# Patient Record
Sex: Male | Born: 1940 | Race: Black or African American | Hispanic: No | Marital: Married | Smoking: Former smoker
Health system: Southern US, Community
[De-identification: ages and names within clinical notes are randomized; demographics above are authoritative.]

## PROBLEM LIST (undated history)

## (undated) DIAGNOSIS — I639 Cerebral infarction, unspecified: Secondary | ICD-10-CM

## (undated) DIAGNOSIS — Z7189 Other specified counseling: Secondary | ICD-10-CM

## (undated) DIAGNOSIS — C801 Malignant (primary) neoplasm, unspecified: Secondary | ICD-10-CM

## (undated) DIAGNOSIS — D539 Nutritional anemia, unspecified: Secondary | ICD-10-CM

## (undated) DIAGNOSIS — I10 Essential (primary) hypertension: Secondary | ICD-10-CM

## (undated) DIAGNOSIS — Z5112 Encounter for antineoplastic immunotherapy: Secondary | ICD-10-CM

## (undated) DIAGNOSIS — Z789 Other specified health status: Secondary | ICD-10-CM

## (undated) HISTORY — DX: Other specified counseling: Z71.89

## (undated) HISTORY — DX: Other specified health status: Z78.9

## (undated) HISTORY — DX: Nutritional anemia, unspecified: D53.9

## (undated) HISTORY — DX: Encounter for antineoplastic immunotherapy: Z51.12

---

## 2000-06-12 DIAGNOSIS — I639 Cerebral infarction, unspecified: Secondary | ICD-10-CM

## 2000-06-12 HISTORY — DX: Cerebral infarction, unspecified: I63.9

## 2000-06-23 ENCOUNTER — Inpatient Hospital Stay (HOSPITAL_COMMUNITY): Admission: EM | Admit: 2000-06-23 | Discharge: 2000-06-29 | Payer: Self-pay | Admitting: *Deleted

## 2000-06-23 ENCOUNTER — Encounter: Payer: Self-pay | Admitting: *Deleted

## 2000-06-25 ENCOUNTER — Encounter: Payer: Self-pay | Admitting: Internal Medicine

## 2000-06-29 ENCOUNTER — Inpatient Hospital Stay (HOSPITAL_COMMUNITY)
Admission: RE | Admit: 2000-06-29 | Discharge: 2000-07-18 | Payer: Self-pay | Admitting: Physical Medicine & Rehabilitation

## 2000-07-23 ENCOUNTER — Encounter
Admission: RE | Admit: 2000-07-23 | Discharge: 2000-09-24 | Payer: Self-pay | Admitting: Physical Medicine & Rehabilitation

## 2004-09-22 ENCOUNTER — Observation Stay (HOSPITAL_COMMUNITY): Admission: EM | Admit: 2004-09-22 | Discharge: 2004-09-24 | Payer: Self-pay | Admitting: Emergency Medicine

## 2006-10-14 ENCOUNTER — Ambulatory Visit: Payer: Self-pay | Admitting: Pulmonary Disease

## 2006-10-14 ENCOUNTER — Inpatient Hospital Stay (HOSPITAL_COMMUNITY): Admission: EM | Admit: 2006-10-14 | Discharge: 2006-11-12 | Payer: Self-pay | Admitting: *Deleted

## 2006-10-15 ENCOUNTER — Encounter (INDEPENDENT_AMBULATORY_CARE_PROVIDER_SITE_OTHER): Payer: Self-pay | Admitting: Interventional Cardiology

## 2006-10-17 ENCOUNTER — Encounter (INDEPENDENT_AMBULATORY_CARE_PROVIDER_SITE_OTHER): Payer: Self-pay | Admitting: Specialist

## 2006-10-26 ENCOUNTER — Encounter (INDEPENDENT_AMBULATORY_CARE_PROVIDER_SITE_OTHER): Payer: Self-pay | Admitting: Otolaryngology

## 2006-11-08 ENCOUNTER — Ambulatory Visit: Payer: Self-pay | Admitting: Dentistry

## 2006-11-19 ENCOUNTER — Ambulatory Visit: Admission: RE | Admit: 2006-11-19 | Discharge: 2007-02-15 | Payer: Self-pay | Admitting: Radiation Oncology

## 2006-11-20 ENCOUNTER — Encounter: Admission: EM | Admit: 2006-11-20 | Discharge: 2006-11-20 | Payer: Self-pay | Admitting: Dentistry

## 2006-11-20 ENCOUNTER — Ambulatory Visit: Payer: Self-pay | Admitting: Dentistry

## 2006-11-22 ENCOUNTER — Ambulatory Visit: Payer: Self-pay | Admitting: Internal Medicine

## 2006-11-29 LAB — CBC WITH DIFFERENTIAL/PLATELET
BASO%: 0.3 % (ref 0.0–2.0)
EOS%: 0.8 % (ref 0.0–7.0)
MCH: 28.4 pg (ref 28.0–33.4)
MCHC: 33.6 g/dL (ref 32.0–35.9)
RBC: 4.03 10*6/uL — ABNORMAL LOW (ref 4.20–5.71)
RDW: 16.2 % — ABNORMAL HIGH (ref 11.2–14.6)
lymph#: 2 10*3/uL (ref 0.9–3.3)

## 2006-11-29 LAB — COMPREHENSIVE METABOLIC PANEL
ALT: 12 U/L (ref 0–53)
AST: 13 U/L (ref 0–37)
Calcium: 9 mg/dL (ref 8.4–10.5)
Chloride: 100 mEq/L (ref 96–112)
Creatinine, Ser: 0.62 mg/dL (ref 0.40–1.50)
Potassium: 3.6 mEq/L (ref 3.5–5.3)

## 2006-12-11 LAB — CBC WITH DIFFERENTIAL/PLATELET
BASO%: 0.8 % (ref 0.0–2.0)
EOS%: 1.4 % (ref 0.0–7.0)
HCT: 37.6 % — ABNORMAL LOW (ref 38.7–49.9)
LYMPH%: 21.4 % (ref 14.0–48.0)
MCH: 27.6 pg — ABNORMAL LOW (ref 28.0–33.4)
MCHC: 33.6 g/dL (ref 32.0–35.9)
MONO%: 7.6 % (ref 0.0–13.0)
NEUT%: 68.8 % (ref 40.0–75.0)
lymph#: 2.4 10*3/uL (ref 0.9–3.3)

## 2006-12-11 LAB — COMPREHENSIVE METABOLIC PANEL
ALT: 10 U/L (ref 0–53)
AST: 12 U/L (ref 0–37)
Alkaline Phosphatase: 67 U/L (ref 39–117)
Chloride: 97 mEq/L (ref 96–112)
Creatinine, Ser: 0.64 mg/dL (ref 0.40–1.50)
Total Bilirubin: 0.3 mg/dL (ref 0.3–1.2)

## 2006-12-18 LAB — CBC WITH DIFFERENTIAL/PLATELET
BASO%: 0.6 % (ref 0.0–2.0)
HCT: 38 % — ABNORMAL LOW (ref 38.7–49.9)
HGB: 12.8 g/dL — ABNORMAL LOW (ref 13.0–17.1)
LYMPH%: 26.4 % (ref 14.0–48.0)
MCH: 27.5 pg — ABNORMAL LOW (ref 28.0–33.4)
MCV: 81.8 fL (ref 81.6–98.0)
MONO#: 0.8 10*3/uL (ref 0.1–0.9)
Platelets: 351 10*3/uL (ref 145–400)
RDW: 13.7 % (ref 11.2–14.6)
lymph#: 2.4 10*3/uL (ref 0.9–3.3)

## 2006-12-18 LAB — COMPREHENSIVE METABOLIC PANEL
ALT: 12 U/L (ref 0–53)
Albumin: 4.1 g/dL (ref 3.5–5.2)
CO2: 24 mEq/L (ref 19–32)
Chloride: 100 mEq/L (ref 96–112)
Potassium: 3.6 mEq/L (ref 3.5–5.3)
Sodium: 137 mEq/L (ref 135–145)
Total Bilirubin: 0.4 mg/dL (ref 0.3–1.2)
Total Protein: 6.6 g/dL (ref 6.0–8.3)

## 2006-12-18 LAB — MAGNESIUM: Magnesium: 1.8 mg/dL (ref 1.5–2.5)

## 2006-12-20 LAB — CBC WITH DIFFERENTIAL/PLATELET
BASO%: 0.4 % (ref 0.0–2.0)
EOS%: 0.3 % (ref 0.0–7.0)
HCT: 37.3 % — ABNORMAL LOW (ref 38.7–49.9)
LYMPH%: 25.7 % (ref 14.0–48.0)
MCH: 28.2 pg (ref 28.0–33.4)
MCHC: 33.5 g/dL (ref 32.0–35.9)
NEUT%: 66.8 % (ref 40.0–75.0)
lymph#: 2.3 10*3/uL (ref 0.9–3.3)

## 2006-12-20 LAB — COMPREHENSIVE METABOLIC PANEL
ALT: 12 U/L (ref 0–53)
AST: 14 U/L (ref 0–37)
Creatinine, Ser: 0.74 mg/dL (ref 0.40–1.50)
Total Bilirubin: 0.3 mg/dL (ref 0.3–1.2)

## 2006-12-25 LAB — CBC WITH DIFFERENTIAL/PLATELET
Basophils Absolute: 0 10*3/uL (ref 0.0–0.1)
EOS%: 1 % (ref 0.0–7.0)
HCT: 37.9 % — ABNORMAL LOW (ref 38.7–49.9)
HGB: 13 g/dL (ref 13.0–17.1)
MCH: 28.3 pg (ref 28.0–33.4)
MCV: 82.6 fL (ref 81.6–98.0)
MONO%: 6.8 % (ref 0.0–13.0)
NEUT%: 71.4 % (ref 40.0–75.0)

## 2007-01-01 LAB — CBC WITH DIFFERENTIAL/PLATELET
Basophils Absolute: 0 10*3/uL (ref 0.0–0.1)
EOS%: 1.4 % (ref 0.0–7.0)
HGB: 12.3 g/dL — ABNORMAL LOW (ref 13.0–17.1)
LYMPH%: 23.8 % (ref 14.0–48.0)
MCH: 26.9 pg — ABNORMAL LOW (ref 28.0–33.4)
MCV: 81.9 fL (ref 81.6–98.0)
MONO%: 9.6 % (ref 0.0–13.0)
RBC: 4.58 10*6/uL (ref 4.20–5.71)
RDW: 14.4 % (ref 11.2–14.6)

## 2007-01-01 LAB — COMPREHENSIVE METABOLIC PANEL
AST: 18 U/L (ref 0–37)
Albumin: 4.1 g/dL (ref 3.5–5.2)
Alkaline Phosphatase: 58 U/L (ref 39–117)
BUN: 13 mg/dL (ref 6–23)
Potassium: 3.9 mEq/L (ref 3.5–5.3)
Total Bilirubin: 0.3 mg/dL (ref 0.3–1.2)

## 2007-01-08 ENCOUNTER — Ambulatory Visit: Payer: Self-pay | Admitting: Internal Medicine

## 2007-01-08 LAB — CBC WITH DIFFERENTIAL/PLATELET
BASO%: 0.6 % (ref 0.0–2.0)
EOS%: 1.1 % (ref 0.0–7.0)
Eosinophils Absolute: 0 10*3/uL (ref 0.0–0.5)
MCHC: 33.5 g/dL (ref 32.0–35.9)
MCV: 80.5 fL — ABNORMAL LOW (ref 81.6–98.0)
MONO%: 8.8 % (ref 0.0–13.0)
NEUT#: 2.8 10*3/uL (ref 1.5–6.5)
RBC: 4.76 10*6/uL (ref 4.20–5.71)
RDW: 14.4 % (ref 11.2–14.6)

## 2007-03-12 ENCOUNTER — Inpatient Hospital Stay (HOSPITAL_COMMUNITY): Admission: EM | Admit: 2007-03-12 | Discharge: 2007-03-18 | Payer: Self-pay | Admitting: Emergency Medicine

## 2007-03-14 ENCOUNTER — Ambulatory Visit: Payer: Self-pay | Admitting: Physical Medicine & Rehabilitation

## 2007-03-18 ENCOUNTER — Inpatient Hospital Stay (HOSPITAL_COMMUNITY)
Admission: RE | Admit: 2007-03-18 | Discharge: 2007-04-15 | Payer: Self-pay | Admitting: Physical Medicine & Rehabilitation

## 2007-05-01 ENCOUNTER — Encounter: Admission: RE | Admit: 2007-05-01 | Discharge: 2007-05-01 | Payer: Self-pay | Admitting: Neurosurgery

## 2007-05-08 ENCOUNTER — Encounter: Admission: RE | Admit: 2007-05-08 | Discharge: 2007-05-08 | Payer: Self-pay | Admitting: Otolaryngology

## 2007-05-21 ENCOUNTER — Encounter
Admission: RE | Admit: 2007-05-21 | Discharge: 2007-05-21 | Payer: Self-pay | Admitting: Physical Medicine & Rehabilitation

## 2007-06-13 DIAGNOSIS — C801 Malignant (primary) neoplasm, unspecified: Secondary | ICD-10-CM

## 2007-06-13 HISTORY — DX: Malignant (primary) neoplasm, unspecified: C80.1

## 2007-06-13 HISTORY — PX: TRACHEOSTOMY: SUR1362

## 2007-06-25 ENCOUNTER — Encounter
Admission: RE | Admit: 2007-06-25 | Discharge: 2007-07-19 | Payer: Self-pay | Admitting: Physical Medicine & Rehabilitation

## 2007-06-25 ENCOUNTER — Ambulatory Visit: Payer: Self-pay | Admitting: Physical Medicine & Rehabilitation

## 2007-07-19 ENCOUNTER — Encounter (INDEPENDENT_AMBULATORY_CARE_PROVIDER_SITE_OTHER): Payer: Self-pay | Admitting: Otolaryngology

## 2007-07-19 ENCOUNTER — Ambulatory Visit (HOSPITAL_COMMUNITY): Admission: RE | Admit: 2007-07-19 | Discharge: 2007-07-20 | Payer: Self-pay | Admitting: Otolaryngology

## 2007-08-24 ENCOUNTER — Ambulatory Visit (HOSPITAL_COMMUNITY): Admission: EM | Admit: 2007-08-24 | Discharge: 2007-08-24 | Payer: Self-pay | Admitting: Emergency Medicine

## 2007-10-03 ENCOUNTER — Ambulatory Visit: Payer: Self-pay | Admitting: Internal Medicine

## 2007-10-03 LAB — CBC WITH DIFFERENTIAL/PLATELET
BASO%: 0.3 % (ref 0.0–2.0)
EOS%: 0.7 % (ref 0.0–7.0)
HCT: 41 % (ref 38.7–49.9)
LYMPH%: 18.9 % (ref 14.0–48.0)
MCH: 27.8 pg — ABNORMAL LOW (ref 28.0–33.4)
MCHC: 34.2 g/dL (ref 32.0–35.9)
NEUT%: 73.6 % (ref 40.0–75.0)
Platelets: 252 10*3/uL (ref 145–400)

## 2007-10-07 ENCOUNTER — Ambulatory Visit (HOSPITAL_COMMUNITY): Admission: RE | Admit: 2007-10-07 | Discharge: 2007-10-07 | Payer: Self-pay | Admitting: Internal Medicine

## 2008-03-26 ENCOUNTER — Ambulatory Visit: Payer: Self-pay | Admitting: Internal Medicine

## 2008-03-30 ENCOUNTER — Ambulatory Visit (HOSPITAL_COMMUNITY): Admission: RE | Admit: 2008-03-30 | Discharge: 2008-03-30 | Payer: Self-pay | Admitting: Internal Medicine

## 2008-03-30 LAB — COMPREHENSIVE METABOLIC PANEL
ALT: 29 U/L (ref 0–53)
AST: 24 U/L (ref 0–37)
Calcium: 9.2 mg/dL (ref 8.4–10.5)
Chloride: 104 mEq/L (ref 96–112)
Creatinine, Ser: 0.85 mg/dL (ref 0.40–1.50)
Sodium: 140 mEq/L (ref 135–145)
Total Bilirubin: 0.9 mg/dL (ref 0.3–1.2)

## 2008-03-30 LAB — CBC WITH DIFFERENTIAL/PLATELET
BASO%: 0.5 % (ref 0.0–2.0)
EOS%: 0.9 % (ref 0.0–7.0)
HCT: 45.6 % (ref 38.7–49.9)
MCH: 29.3 pg (ref 28.0–33.4)
MCHC: 33.9 g/dL (ref 32.0–35.9)
NEUT%: 66.7 % (ref 40.0–75.0)
RBC: 5.28 10*6/uL (ref 4.20–5.71)
lymph#: 1.5 10*3/uL (ref 0.9–3.3)

## 2008-09-28 ENCOUNTER — Ambulatory Visit: Payer: Self-pay | Admitting: Internal Medicine

## 2008-09-30 ENCOUNTER — Ambulatory Visit (HOSPITAL_COMMUNITY): Admission: RE | Admit: 2008-09-30 | Discharge: 2008-09-30 | Payer: Self-pay | Admitting: Internal Medicine

## 2008-09-30 LAB — CBC WITH DIFFERENTIAL/PLATELET
BASO%: 0.5 % (ref 0.0–2.0)
EOS%: 0.9 % (ref 0.0–7.0)
MCH: 29.3 pg (ref 27.2–33.4)
MCHC: 33.8 g/dL (ref 32.0–36.0)
MONO#: 0.5 10*3/uL (ref 0.1–0.9)
RDW: 14 % (ref 11.0–14.6)
WBC: 6.1 10*3/uL (ref 4.0–10.3)
lymph#: 1.7 10*3/uL (ref 0.9–3.3)

## 2008-09-30 LAB — COMPREHENSIVE METABOLIC PANEL
ALT: 20 U/L (ref 0–53)
AST: 26 U/L (ref 0–37)
Albumin: 3.7 g/dL (ref 3.5–5.2)
CO2: 29 mEq/L (ref 19–32)
Calcium: 9 mg/dL (ref 8.4–10.5)
Chloride: 103 mEq/L (ref 96–112)
Potassium: 3.8 mEq/L (ref 3.5–5.3)
Sodium: 138 mEq/L (ref 135–145)
Total Protein: 7 g/dL (ref 6.0–8.3)

## 2008-12-31 ENCOUNTER — Ambulatory Visit (HOSPITAL_COMMUNITY): Admission: RE | Admit: 2008-12-31 | Discharge: 2008-12-31 | Payer: Self-pay | Admitting: Internal Medicine

## 2008-12-31 ENCOUNTER — Ambulatory Visit: Payer: Self-pay | Admitting: Internal Medicine

## 2008-12-31 LAB — CBC WITH DIFFERENTIAL/PLATELET
BASO%: 0.4 % (ref 0.0–2.0)
Basophils Absolute: 0 10*3/uL (ref 0.0–0.1)
EOS%: 1.1 % (ref 0.0–7.0)
HCT: 46.3 % (ref 38.4–49.9)
HGB: 15.4 g/dL (ref 13.0–17.1)
MCH: 28.6 pg (ref 27.2–33.4)
MONO#: 0.4 10*3/uL (ref 0.1–0.9)
NEUT%: 65.6 % (ref 39.0–75.0)
RDW: 14.9 % — ABNORMAL HIGH (ref 11.0–14.6)
WBC: 5.8 10*3/uL (ref 4.0–10.3)
lymph#: 1.5 10*3/uL (ref 0.9–3.3)

## 2008-12-31 LAB — COMPREHENSIVE METABOLIC PANEL
ALT: 19 U/L (ref 0–53)
AST: 22 U/L (ref 0–37)
Albumin: 3.8 g/dL (ref 3.5–5.2)
BUN: 12 mg/dL (ref 6–23)
CO2: 25 mEq/L (ref 19–32)
Calcium: 9.2 mg/dL (ref 8.4–10.5)
Chloride: 105 mEq/L (ref 96–112)
Creatinine, Ser: 1 mg/dL (ref 0.40–1.50)
Potassium: 3.8 mEq/L (ref 3.5–5.3)

## 2009-03-31 ENCOUNTER — Ambulatory Visit: Payer: Self-pay | Admitting: Internal Medicine

## 2009-04-02 ENCOUNTER — Ambulatory Visit (HOSPITAL_COMMUNITY): Admission: RE | Admit: 2009-04-02 | Discharge: 2009-04-02 | Payer: Self-pay | Admitting: Internal Medicine

## 2009-04-02 LAB — CBC WITH DIFFERENTIAL/PLATELET
Basophils Absolute: 0 10*3/uL (ref 0.0–0.1)
Eosinophils Absolute: 0.1 10*3/uL (ref 0.0–0.5)
HCT: 45.2 % (ref 38.4–49.9)
HGB: 15.1 g/dL (ref 13.0–17.1)
MCH: 28.3 pg (ref 27.2–33.4)
MONO#: 0.4 10*3/uL (ref 0.1–0.9)
NEUT#: 3.8 10*3/uL (ref 1.5–6.5)
NEUT%: 64.4 % (ref 39.0–75.0)
RDW: 14.8 % — ABNORMAL HIGH (ref 11.0–14.6)
lymph#: 1.7 10*3/uL (ref 0.9–3.3)

## 2009-04-02 LAB — COMPREHENSIVE METABOLIC PANEL
Albumin: 3.7 g/dL (ref 3.5–5.2)
BUN: 13 mg/dL (ref 6–23)
CO2: 27 mEq/L (ref 19–32)
Calcium: 8.8 mg/dL (ref 8.4–10.5)
Chloride: 107 mEq/L (ref 96–112)
Creatinine, Ser: 0.98 mg/dL (ref 0.40–1.50)
Glucose, Bld: 103 mg/dL — ABNORMAL HIGH (ref 70–99)
Potassium: 3.8 mEq/L (ref 3.5–5.3)

## 2009-09-30 ENCOUNTER — Ambulatory Visit: Payer: Self-pay | Admitting: Internal Medicine

## 2009-10-01 ENCOUNTER — Ambulatory Visit (HOSPITAL_COMMUNITY): Admission: RE | Admit: 2009-10-01 | Discharge: 2009-10-01 | Payer: Self-pay | Admitting: Internal Medicine

## 2009-10-01 LAB — CBC WITH DIFFERENTIAL/PLATELET
Basophils Absolute: 0 10*3/uL (ref 0.0–0.1)
Eosinophils Absolute: 0.1 10*3/uL (ref 0.0–0.5)
HGB: 15.8 g/dL (ref 13.0–17.1)
MCV: 86.7 fL (ref 79.3–98.0)
MONO%: 7.5 % (ref 0.0–14.0)
NEUT#: 4.5 10*3/uL (ref 1.5–6.5)
RDW: 15 % — ABNORMAL HIGH (ref 11.0–14.6)

## 2009-10-01 LAB — COMPREHENSIVE METABOLIC PANEL
Albumin: 3.9 g/dL (ref 3.5–5.2)
Alkaline Phosphatase: 63 U/L (ref 39–117)
BUN: 12 mg/dL (ref 6–23)
Calcium: 9.1 mg/dL (ref 8.4–10.5)
Chloride: 103 mEq/L (ref 96–112)
Glucose, Bld: 85 mg/dL (ref 70–99)
Potassium: 3.6 mEq/L (ref 3.5–5.3)

## 2009-10-11 ENCOUNTER — Ambulatory Visit (HOSPITAL_COMMUNITY): Admission: RE | Admit: 2009-10-11 | Discharge: 2009-10-11 | Payer: Self-pay | Admitting: Internal Medicine

## 2009-10-13 ENCOUNTER — Ambulatory Visit (HOSPITAL_COMMUNITY): Admission: RE | Admit: 2009-10-13 | Discharge: 2009-10-13 | Payer: Self-pay | Admitting: Internal Medicine

## 2009-10-29 ENCOUNTER — Ambulatory Visit (HOSPITAL_COMMUNITY): Admission: RE | Admit: 2009-10-29 | Discharge: 2009-10-29 | Payer: Self-pay | Admitting: Internal Medicine

## 2009-11-01 ENCOUNTER — Ambulatory Visit: Payer: Self-pay | Admitting: Internal Medicine

## 2009-11-09 ENCOUNTER — Ambulatory Visit: Payer: Self-pay | Admitting: Thoracic Surgery

## 2009-11-18 ENCOUNTER — Ambulatory Visit (HOSPITAL_COMMUNITY): Admission: RE | Admit: 2009-11-18 | Discharge: 2009-11-18 | Payer: Self-pay | Admitting: Thoracic Surgery

## 2009-11-24 ENCOUNTER — Ambulatory Visit: Payer: Self-pay | Admitting: Thoracic Surgery

## 2009-12-01 ENCOUNTER — Ambulatory Visit: Admission: RE | Admit: 2009-12-01 | Discharge: 2009-12-31 | Payer: Self-pay | Admitting: Radiation Oncology

## 2009-12-30 ENCOUNTER — Ambulatory Visit: Payer: Self-pay | Admitting: Internal Medicine

## 2010-01-03 LAB — CBC WITH DIFFERENTIAL/PLATELET
Basophils Absolute: 0 10*3/uL (ref 0.0–0.1)
Eosinophils Absolute: 0.1 10*3/uL (ref 0.0–0.5)
HGB: 15.2 g/dL (ref 13.0–17.1)
LYMPH%: 20.5 % (ref 14.0–49.0)
MCV: 86.1 fL (ref 79.3–98.0)
MONO%: 7.8 % (ref 0.0–14.0)
NEUT#: 3.9 10*3/uL (ref 1.5–6.5)
NEUT%: 70.6 % (ref 39.0–75.0)
Platelets: 211 10*3/uL (ref 140–400)

## 2010-01-03 LAB — COMPREHENSIVE METABOLIC PANEL
Albumin: 4.1 g/dL (ref 3.5–5.2)
Alkaline Phosphatase: 60 U/L (ref 39–117)
BUN: 12 mg/dL (ref 6–23)
Creatinine, Ser: 0.99 mg/dL (ref 0.40–1.50)
Glucose, Bld: 96 mg/dL (ref 70–99)
Potassium: 4 mEq/L (ref 3.5–5.3)
Total Bilirubin: 0.4 mg/dL (ref 0.3–1.2)

## 2010-03-29 ENCOUNTER — Ambulatory Visit: Payer: Self-pay | Admitting: Internal Medicine

## 2010-03-31 ENCOUNTER — Ambulatory Visit (HOSPITAL_COMMUNITY): Admission: RE | Admit: 2010-03-31 | Discharge: 2010-03-31 | Payer: Self-pay | Admitting: Internal Medicine

## 2010-03-31 LAB — CBC WITH DIFFERENTIAL/PLATELET
Basophils Absolute: 0 10*3/uL (ref 0.0–0.1)
EOS%: 0.9 % (ref 0.0–7.0)
Eosinophils Absolute: 0.1 10*3/uL (ref 0.0–0.5)
LYMPH%: 18.5 % (ref 14.0–49.0)
MCH: 28.6 pg (ref 27.2–33.4)
MCV: 86.2 fL (ref 79.3–98.0)
MONO%: 8.7 % (ref 0.0–14.0)
NEUT#: 4 10*3/uL (ref 1.5–6.5)
Platelets: 206 10*3/uL (ref 140–400)
RBC: 5.08 10*6/uL (ref 4.20–5.82)

## 2010-03-31 LAB — COMPREHENSIVE METABOLIC PANEL
AST: 21 U/L (ref 0–37)
Alkaline Phosphatase: 60 U/L (ref 39–117)
BUN: 12 mg/dL (ref 6–23)
Glucose, Bld: 107 mg/dL — ABNORMAL HIGH (ref 70–99)
Sodium: 141 mEq/L (ref 135–145)
Total Bilirubin: 0.4 mg/dL (ref 0.3–1.2)

## 2010-06-30 ENCOUNTER — Ambulatory Visit: Payer: Self-pay | Admitting: Internal Medicine

## 2010-07-03 ENCOUNTER — Encounter: Payer: Self-pay | Admitting: Internal Medicine

## 2010-07-04 ENCOUNTER — Ambulatory Visit (HOSPITAL_COMMUNITY)
Admission: RE | Admit: 2010-07-04 | Discharge: 2010-07-04 | Payer: Self-pay | Source: Home / Self Care | Attending: Internal Medicine | Admitting: Internal Medicine

## 2010-07-04 LAB — CMP (CANCER CENTER ONLY)
AST: 28 U/L (ref 11–38)
Albumin: 3.6 g/dL (ref 3.3–5.5)
Alkaline Phosphatase: 62 U/L (ref 26–84)
Glucose, Bld: 102 mg/dL (ref 73–118)
Potassium: 4.1 mEq/L (ref 3.3–4.7)
Sodium: 139 mEq/L (ref 128–145)
Total Bilirubin: 0.5 mg/dl (ref 0.20–1.60)
Total Protein: 7.2 g/dL (ref 6.4–8.1)

## 2010-07-04 LAB — CBC WITH DIFFERENTIAL/PLATELET
BASO%: 0.4 % (ref 0.0–2.0)
EOS%: 1.2 % (ref 0.0–7.0)
Eosinophils Absolute: 0.1 10*3/uL (ref 0.0–0.5)
LYMPH%: 19.9 % (ref 14.0–49.0)
MCH: 28.2 pg (ref 27.2–33.4)
MCHC: 33.3 g/dL (ref 32.0–36.0)
MCV: 84.6 fL (ref 79.3–98.0)
MONO%: 8.4 % (ref 0.0–14.0)
Platelets: 206 10*3/uL (ref 140–400)
RBC: 5.43 10*6/uL (ref 4.20–5.82)
RDW: 15.3 % — ABNORMAL HIGH (ref 11.0–14.6)

## 2010-07-06 ENCOUNTER — Other Ambulatory Visit: Payer: Self-pay | Admitting: Internal Medicine

## 2010-07-06 DIAGNOSIS — C3490 Malignant neoplasm of unspecified part of unspecified bronchus or lung: Secondary | ICD-10-CM

## 2010-07-28 ENCOUNTER — Ambulatory Visit: Payer: MEDICARE | Attending: Radiation Oncology | Admitting: Radiation Oncology

## 2010-08-29 LAB — CBC
Hemoglobin: 15.1 g/dL (ref 13.0–17.0)
Hemoglobin: 15.4 g/dL (ref 13.0–17.0)
RBC: 5.23 MIL/uL (ref 4.22–5.81)
RBC: 5.29 MIL/uL (ref 4.22–5.81)
WBC: 6.4 10*3/uL (ref 4.0–10.5)
WBC: 5.8 10*3/uL (ref 4.0–10.5)

## 2010-08-29 LAB — PROTIME-INR
INR: 1.07 (ref 0.00–1.49)
INR: 1.05 (ref 0.00–1.49)

## 2010-08-29 LAB — APTT
aPTT: 38 seconds — ABNORMAL HIGH (ref 24–37)
aPTT: 37 seconds (ref 24–37)

## 2010-10-25 NOTE — Consult Note (Signed)
[name], AGUAYO NO.:  [id number]   MEDICAL RECORD NO.:  [id number]          PATIENT TYPE:  INP   LOCATION:  3314                         FACILITY:  [facility]   PHYSICIAN:  Lyn Records, M.D.   DATE OF BIRTH:  08-17-1940   DATE OF CONSULTATION:  03/12/2007  DATE OF DISCHARGE:                                 CONSULTATION   CONCLUSIONS:  1. 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  2. 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  3. History of bilateral neck hematoma requiring evacuation when      heparin therapy was resumed following surgery in an attempt to      treat his drug-eluting stent to prevent acute occlusion  4. Leg weakness, possibly related to radiation therapy leading to a      fall and head trauma and now was subsequent subdural hematoma.   RECOMMENDATIONS:  1. Discontinue Plavix since we have been on this therapy now for      approximately 6 months.  The risk of subacute stent thrombosis is      relatively low.  2. Resume aspirin therapy when he is clinically safe at 81 mg per day.  3. Monitor for ischemic symptoms.   COMMENT:  The patient was admitted to the hospital after becoming weak  and fall in his bathroom.  He had head trauma and CT scan demonstrated  the subdural hematoma.  There is a preceding history of drug-eluting  stent implantation in March of 2008.  He had head and neck tumor  resection in May 2008 and subsequently had complications with bleeding  as we were trying to use heparin therapy to prevent stent thrombosis.  He has had no ischemic symptoms since stent implanted.   MEDICATIONS:  On admission included Altace 10 mg per day, allopurinol  300 mg per day, Norvasc 10 mg per day, aspirin 325 mg per day, HCTZ 25  mg per day, Lipitor 10 mg  daily, Plavix 75 mg per day, metoprolol ER 50  mg daily, and Pepcid 20 mg per day.   SOCIAL HISTORY:  Does not currently smoke or drink.   FAMILY HISTORY:  Positive for lung cancer and coronary artery disease.   PHYSICAL EXAMINATION:  GENERAL:  On exam, the patient was in no acute  distress.  He is lying comfortably in bed.  He is unable to speak other  than with voice amplifier because of laryngectomy.  VITAL SIGNS:  Blood pressure 115/70 and heart rate 80.  HEENT:  Exam positive for tracheostomy.  CHEST:  Clear.  CARDIAC:  No rub.  No click.  No gallop.  EXTREMITIES:  No edema.   DIAGNOSTIC DATA:  ECG done on admission revealed left bundle branch  block, normal sinus rhythm.   LABORATORY DATA:  Unremarkable.   DISCUSSION:  We will hope that subacute stent thrombosis is  not  occurred.  Given the patient's comorbidities, I feel that  discontinuation of Plavix therapy is indicated.  Resume aspirin when  clinically safe.      Lyn Records, M.D.  Electronically Signed     HWS/MEDQ  D:  03/13/2007  T:  03/13/2007  Job:  [id number]   cc:   Payton Doughty, M.D.  Marlan Palau, M.D.

## 2010-10-25 NOTE — Discharge Summary (Signed)
NAMEABDON, PETROSKY NO.:  [id number]   MEDICAL RECORD NO.:  [id number]          PATIENT TYPE:  IPS   LOCATION:  4007                         FACILITY:  [facility]   PHYSICIAN:  Ellwood Dense, M.D.   DATE OF BIRTH:  1941/05/27   DATE OF ADMISSION:  03/18/2007  DATE OF DISCHARGE:  04/11/2007                               DISCHARGE SUMMARY   DISCHARGE DIAGNOSES:  1. Right subdural left intraparenchymal hemorrhage.  2. Cerebral salt wasting, with hyponatremia.  3. Severe balanitis.  4. Dyslipidemia.  5. Diabetes mellitus type 2.  6. Gout.  7. Coronary artery disease.   HISTORY OF PRESENT ILLNESS:  Mr. Hartlage is a 70 year old male with  history of CVA with left hemiparesis, laryngeal cancer with resection,  and a tracheostomy in May 2008, admitted March 12, 2007 with fall in  inability to stand.  CT of head revealed right subdural hemorrhage, left  intraparenchymal hemorrhage.  The patient was noted to have right lower  extremity weakness at admission.  Neurology, Dr. Anne Hahn, and Dr. Channing Mutters of  neurosurgery, were consulted for input.  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  The patient was  treated with steroids, and serial CCT recommended for follow-up.  Last  CT of March 13, 2007 shows a large subacute subdural hemorrhage with  4.6 mm right-to-left shift, with mild interval decrease in thickness of  subdural hemorrhage and shift.  Dr. Katrinka Blazing was consulted for evaluation  and recommended discontinuing Plavix and aspirin once the patient was  clinically safe.  Dr. Anne Hahn and Dr. Channing Mutters said the patient's right  extremity weakness was secondary to the left intraparenchymal hemorrhage  and combination of myelopathy.  Therapies initiated, and the patient is  doing pre-gait activity.  Continues to be impaired in mobility and self-  care, and rehab consulted for further therapy.   PAST MEDICAL HISTORY:  1. CVA in 2002, with left  hemiparesis, upper extremity greater than      lower.  2. Coronary artery disease, with PTCA.  3. Dyslipidemia.  4. DM type 2.  5. Recent anxiety issues secondary to tracheostomy.  6. Gout.  7. Laryngeal cancer, with resection in May 2008, and recent completion      of XRT and chemotherapy.   ALLERGIES:  NO KNOWN DRUG ALLERGIES.   FAMILY HISTORY:  Positive for cancer and hypertension.   SOCIAL HISTORY:  The patient is married, lives in two-level home with  five steps at entry, one flight of stairs to bedroom.  The patient was  smoking 1 pack per day until 2002.  He has a glass of wine with dinner.  The patient was independent with cane and was still occasionally driving  prior to admission.   HOSPITAL COURSE:  Mr. Vlad Mayberry was admitted to rehab on March 18, 2007 for inpatient therapies to consist of PT and OT daily.  At time of  admission, the patient was on Decadron 6 mg p.o. q.8 h., and this was  tapered off during the stay.  Labs done at admission revealed hemoglobin  13.2, hematocrit 39.7, white count 14.8, platelets 366. Check of lytes  revealed sodium 134, potassium 4.3, chloride 99, CO2 28, BUN 25,  creatinine 0.87, glucose 129.  LFTs within normal limits.  The patient  was noted to have problems with hiccups and spasticity, left greater  than right initially.  He was started on baclofen t.i.d. to help assist  with symptomatology.  Speech therapy evaluated the patient for  dysphasia, and the patient was kept on a regular diet and thin liquids.  The patient was noted to have narrowing of stoma with complaints of  taking a deep breath on March 22, 2007.  Sliced trache collar was  ordered to help with respiratory symptoms.  Dr. Jenne Pane was consulted on  March 22, 2007.  He dilated the patient's stoma, and a #10 stoma  button was placed with great fit.  Mucomyst nebulizers were initiated to  help with secretions.  The patient's blood pressures were monitored on  b.i.d.  basis, and these have tended to be on the low side.  Initially,  these were ranging from 90s-100s systolic, 60s-70s diastolic.  These  have continues to be low, with most recent blood pressures ranging from  90s to low in the 70s, and patient's Norvasc was placed on hold on  April 08, 2007.  The patient's blood sugars have been monitored on an  a.c. and at bedtime basis.  The patient's appetite initially was poor.  This is slowly improving.  Blood sugars are currently ranging from 90s-  110 range on a regular diet.  The patient continues on a regular diet to  optimize p.o.'s.  If  blood sugars continue to trend over 120,  recommended placing the patient on a carb-modified diet.   The patient has had increase in stoma size to where button kept falling  off.  He felt that this was keeping him discomforted.  Button was  disrupting his sleep pattern.  Therefore, he stopped using it over the  weekend of October 25-26, 2008.  However, the patient has had some  increase in bleeding around the stoma site, with some further decrease  in stoma size.  A humidified O2 per trache collar was initiated at  bedtime.  The patient is advised to continue wearing stoma button during  the day to help maintain stoma size.  Dr. Jenne Pane was consulted regarding  changing the stoma button to metal collar, as stoma button tends to fall  out.  He fell as long as the patient is able to keep the stoma button in  place and has no difficulty breathing, no need for further interventions  currently.   Also of issue, the patient has had problems with frequency, urgency,  with urinary incontinence.  He was noted to have abrasions on bilateral  buttocks and on sacrum.  Secondary to urinary incontinence, this is  noted to be worsening.  He did agree to have Foley placed to help  maintain continence and prevent further breakdown on April 01, 2007.  WOC was consulted for input on sacral area, and current currently  Mepilex  is being used on sacral areas, with recommendations to change  this q.5 days and earlier p.r.n..  The patient has developed an abrasion  on right elbow.  Mepilex was also placed on right elbow, with elbow  protector to be used additionally to prevent irritation, as the patient  has excessive use of the sling to help with his bed mobility.  Recommendations were also to continue  using and air mattress overlay for  pressure relief when in bed and a thick gel question when the patient is  in chair.   The patient was started on zinc sulfate and vitamin C to help with wound  healing.  P.o. intake is much improved. Nutritional supplements were  also continued on a t.i.d. basis.  The last weight of April 03, 2007  is 72 kg.  The patient has had issues with hyponatremia, with sodium  dropping down to 120.  Secondary to his brain injury and traumatic  bleed, he was started on desmopressin 0.1 mg b.i.d.  Sodium did come up  slowly with sequential checks to 123 and 125.  Desmopressin was  increased to a t.i.d. basis.  The patient has also had issues with low  blood pressure question secondary to baclofen.  He was treated with 1  liter of fluid on April 08, 2007, as BP was noted to be at 72/48.  The  patient was asymptomatic at this time.  Blood pressures on April 09, 2007 were ranging from 100-103 systolic.  In a.m. of April 10, 2007,  blood pressures were noted to be low again at 88/51.  He was treated  with another liter of normal saline currently.  Baclofen was tapered  down to 1 p.o. at bedtime.  Check of lytes on April 10, 2007 reveals  sodium 125, potassium 3.6, chloride 93, CO2 27, BUN 10, creatinine 0.57,  glucose 92.  Anticipate with another liter of hydration the patient's  hyponatremia and blood pressure to be slightly improved in the next 24  hours.   On weekend of April 07, 2007, the patient was noted to have a penile  ulcer.  He also reported increasing penile discomfort.   WOC consulted  for input and felt that the patient with several scattered wound with  been affected area on foreskin and on penile shaft.  They recommended GU  input.  Dr. Retta Diones was consulted for input, and exam revealed a  significant amount of exudate on the patient's foreskin, which when  retracted revealed a number of fairly large erythematous lesions  consistent with fungal balanitis.  Mild urethral discharge was noted.  Recommendations were to cleanse the area with hydrogen peroxide twice a  day and apply Lotrimin cream, keep foreskin retracted as well.  If  foreskin becomes edematous or paraphimosis sets in, would replace  foreskin after cleansing and Lotrimin placement.  Consult GU p.r.n.  The  patient has had improvement in discomfort with current ongoing care for  balanitis.   The patient was initially noted to have new right hemiparesis at time of  admission.  Right upper extremity strength is much improved, and right  lower extremity tone and spasticity has resolved.  He continues to be  limited with decreased endurance with multiple issues ongoing.  Currently, the patient is progressing along in all areas of mobility  from total assist +2 to max to total assist, with decrease in his pusher  syndrome.  He has improved posture control.  He is able to perform  static sitting balance for 5 minutes with supervision.  Dynamic sitting  balance varied from supervision to min assist.  Type of transfers have  been an issue, as patient unable to use sliding board due to pressure  areas.  Currently, TheraPlus is being used for transfers.  Patient has  not been able to propel his wheelchair.  He does not have enough  strength to perform knee  flexion and extension with friction of floor to  propel wheelchair.  The patient is currently at max to total assist for  BADLs.  Speech therapy has been working with the patient in using  exaggeration of articulation movements with his  intraoral  electrolaryngoscopy device.  He is currently at supervision with cues to  communicate with 80% intelligibility.  He is able to use writing and  gesturing to augment his electrolarynx.  He is at supervision to make  safe decisions and solve basic problems.  He is currently able to ID two  deficits in areas of elaboration for specific needs with min cues.  Currently, the patient continues to require mod to total assist.  He  will also continue to require progressive PT, OT, and speech therapy,  and family has elected on SNF.  Bed is available at [facility]  for April 11, 2007, and the patient is to be discharged this facility.   DISCHARGE MEDICATIONS:  1. Allopurinol 300 mg p.o. per day.  2. Pepcid 20 mg per day.  3. Lipitor 10 mg p.o. q.p.m.  4. Coated aspirin 81 mg a day.  5. Multivitamin 1 p.o. per day.  6. Baclofen 10 mg p.o. at bedtime.  7. Vitamin C 5 mg p.o. b.i.d.  8. Zinc sulfate 220 mg p.o. b.i.d.  9. Eucerin cream to bilateral heels b.i.d.  10.Ventolin nebulizers t.i.d.  11.Lopressor 25 mg p.o. b.i.d.  12.Dulcolax tabs 15 mg p.o. at bedtime.  13.Colace 200 mg p.o. q.a.m.  14.DDAVP 0.1 mg p.o. t.i.d.  15.Clotrimazole 1% cream to penis after cleansing with H2O2.  16.Resource supplements b.i.d.  17.Robitussin 5-10 mL p.o. q.6 h. p.r.n.  18.Ultram 50 mg p.o. q.6 h. p.r.n. pain.  19.Ativan 1 mg p.o. t.i.d. p.r.n. anxiety.  20.Nitroglycerin 0.4 mg sublingual p.r.n. chest pain.  21.Vicodin 5/325, 1-2 p.o. q.4 h. p.r.n. severe pain.  22.Mucomyst 400 mg nebulizers t.i.d. p.r.n.  23.Lidocaine jelly to penis p.r.n. discomfort.   DIET:  Regular, thin liquids.   ACTIVITY LEVEL:  24-hour supervision, assistance at wheelchair level.   WOUND CARE:  Clean penile skin with hygiene peroxide q.12 h.  Dry  lesions, then apply small amount of Lotrimin cream b.i.d.  Okay to leave  foreskin pulled back unless distal skin swells.  Routine pressure relief  measures  with air mattress overlay and thick gel question when in chair.  Foley for straight drain for now until sacral ulcer has improved.  Continue using Mepilex to bilateral sacral decubitus and left elbow  abrasion, changed q.3-5 days, earlier if soiled or starts to roll.   SPECIAL INSTRUCTIONS:  CBG checks on a.c. and at bedtime basis with  sliding scale insulin per protocol.  If blood sugars start trending  above 120s, may initiate low dose of oral hypoglycemic for tighter  control.  Routine check of hyponatremia q.3-5 days, earlier if patient  with lethargy, mental status changes, or confusion.  Taper baclofen to  off in the next week.  Progressive PT, OT, and speech therapy to  continue past discharge.   FOLLOWUP:  1. Patient to follow up with Dr. Channing Mutters for repeat head CT in the next      few weeks.  2. Follow up with Dr. Retta Diones for any further worsening of      balanitis, or if this does not resolve.  3. Follow up with Dr. Verdis Prime for cardiac issues.  4. Follow up with Dr. Arbutus Ped for repeat CT of neck in future.  5. Follow up with Dr. Jenne Pane for issues regarding tracheostomy.  6. Follow up with Dr. Thomasena Edis in 6 weeks.      Greg Cutter, P.A.    ______________________________  Ellwood Dense, M.D.    PP/MEDQ  D:  04/10/2007  T:  04/11/2007  Job:  [id number]   cc:   Olene Craven, M.D.  Payton Doughty, M.D.  Marlan Palau, M.D.  Bertram Millard. Dahlstedt, M.D.  Lyn Records, M.D.

## 2010-10-25 NOTE — H&P (Signed)
[name], [name] NO.:  [id number]   MEDICAL RECORD NO.:  [id number]          PATIENT TYPE:  INP   LOCATION:  3020                         FACILITY:  [facility]   PHYSICIAN:  [name], M.D.   DATE OF BIRTH:  [birth year]   DATE OF ADMISSION:  03/12/2007  DATE OF DISCHARGE:                              HISTORY & PHYSICAL   HISTORY AND PHYSICAL ADMIT NOTE TO THE REHABILITATION UNIT:   PRIMARY CARE PHYSICIAN:  [name], M.D.   NEUROSURGEON:  [name], M.D.   CARDIOLOGIST:  [name], M.D.   NEUROLOGIST:  [name], M.D.   HEMATOLOGY/ONCOLOGY:  [name]. Casey Box, MD and Casey Matte, MD   HISTORY OF PRESENT ILLNESS:  Mr. [name] is a 70 year old African-American  male with history of prior stroke and left hemiparesis along with  laryngeal cancer with prior resection and tracheostomy done May 2008.   The patient was admitted, March 12, 2007, after a fall with  inability to stand.  Cranial CT on admission showed a right subdural  hematoma along with left intraparenchymal hemorrhage.  The patient was  noted to have new onset of right-sided weakness after admission, and  nephrology was consulted for input.  Cervical spine films showed C3-4  stenosis with cord compression and mild myelomalacia.  The patient was  treated with IVs and p.o. steroids.  Serial CAT scans were recommended  for followup.  Dr. Katrinka Cooper evaluated the patient and recommended  discontinuation of Plavix and aspirin until more clinically safe.  Dr.  Anne Cooper and Dr. Channing Cooper saw the patient for the right lower extremity  weakness, and they felt it was secondary to a left intraparenchymal  hemorrhage with a combination of cervical myelopathy.   Cranial CT done, March 13, 2007, showed large subacute subdural  hematoma with 4-6 mm of right to left shift along with three foci of  intraparenchymal hemorrhage on the left.  There was mild decrease  interval and thickening of  the subdural hematoma and shift.   The patient was evaluated by the rehabilitation physicians and felt to  be an appropriate candidate for inpatient rehabilitation.   REVIEW OF SYSTEMS:  Noncontributory.   PAST MEDICAL HISTORY:  1. 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  2. History of coronary artery disease with prior PTCA.  3. Gout.  4. Dyslipidemia.  5. Type 2 diabetes mellitus.  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  7. History of laryngeal cancer with resection in May 2008 and recent      completion of chemotherapy and radiation therapy.   FAMILY HISTORY:  Positive for cancer and hypertension.   SOCIAL HISTORY:  The patient is married and lives in a two-level home  with 5 steps to enter with 1 flight of stairs to the bedroom.  The  patient smoked 1 pack of cigarettes per day until he quit in 2002.  The  patient uses wine on a regular basis with dinner.  His wife, [name], is a  nurse, night shift.   FUNCTIONAL HISTORY:  Prior to admission:  Independent with a cane with  occasional loss of balance and still driving.   ALLERGIES:  No known drug allergies.   MEDICATIONS PRIOR TO ADMISSION:  1. Toprol 50 mg p.o. daily.  2. Allopurinol 300 mg daily.  3. Plavix 75 mg daily.  4. Altace 10 mg daily.  5. Hydrochlorothiazide 25 mg daily.  6. Ativan q.8h. p.r.n.  7. Vicodin p.r.n.  8. Lipitor daily.  9. Nitroglycerin 0.4 mg sublingual p.r.n.  10.Aspirin 325 mg daily.  11.Pepcid 20 mg daily.  12.Norvasc 10 mg daily.   LABORATORY:  Hemoglobin was 13.0 with hematocrit of 39.5, platelet count  of 269,000, and white count of 5.9.  Recent sodium was 139, potassium  3.7, chloride 104, BUN 9, creatinine 0.8, and glucose of 109.   PHYSICAL EXAM:  GENERAL:  He is a well-appearing middle-aged elderly  adult male, sitting up in the chair in mild-to-no-acute discomfort.  VITAL SIGNS:  Blood pressure and temperature were not  obtained, but  pulse was 72 and respiratory rate was 18.  HEENT:  Normocephalic, atraumatic.  CARDIOVASCULAR:  Regular rate and rhythm.  S1, S2, without murmurs.  ABDOMEN:  Soft, obese, nontender with positive bowel sounds.  LUNGS:  Clear to auscultation bilaterally.  NECK EXAM:  Reveals a stoma with O2 by trach collar.  EXTREMITIES:  Right upper and lower extremities strengths were 4-/5  throughout.  Bulk and tone were normal.  Reflexes were 2+ and  symmetrical.  Left lower extremity exam showed 4-/5 strength in hip  flexion and knee extension with normal bulk and increased tone.  Left  upper extremity showed significant increased tone with 0/5 strength  functionally.   IMPRESSION:  1. Status post fall with resultant right subdural hematoma/left      intraparenchymal hemorrhage with conservative care.  2. Cervical stenosis with myelopathy.  3. History of laryngeal cancer with prior resection, radiation, and      chemotherapy.  4. Prior tracheostomy with stoma intact.   Presently, the patient has deficits in ADLs, transfers, and ambulation  related to the above-noted subdural hematoma/left intraparenchymal  hemorrhage along with cervical stenosis with myelopathy.  He also has a  history of prior laryngeal cancer along with prior stroke with left  hemiparesis, arm greater than leg on the left side.   PLAN:  1. Admit to the rehabilitation unit for daily therapy to include      physical therapy for range of motion, strengthening, bed mobility,      transfers, pregait training, gait training, and equipment.  2. Start the patient with therapy for range of motion, strengthening,      ADLs, cognitive/perceptual training, splinting, and equipment.  3. Rehab nursing for skin care, wound care, and bowel and bladder      training if necessary.  4. Speech therapy for oral-motor exercises and evaluation of swallow      and communication aids as necessary.  5. Case management to assess home  environment, assist with discharge      planning, and arrange for appropriate followup care.  6. Social work to assess family and social support and assist in      discharge planning.  7. Regular diet.  8. Check admission laboratory including CBC and CMET in a.m., March 19, 2007.  9. Toprol XL 50 mg p.o. daily.  10.Allopurinol 300 mg p.o. daily.  11.Altace 10 mg p.o. daily.  12.Hydrochlorothiazide  25 mg p.o. daily.  13.Norvasc 10 mg p.o. daily.  14.Pepcid 20 mg p.o. daily.  15.Lipitor 10 mg p.o. daily, dispense as written.  16.Decadron 6 mg p.o. q.8h.  17.Aspirin 81 mg p.o. daily.  18.Albuterol nebulizers 2.5 mg q.6h. p.r.n.  19.Mucomyst 400 mg nebulizers q.6h. p.r.n.  20.Nitroglycerin 0.4 mg sublingual p.r.n. for chest pain, may repeat      times total of 3 doses every 5 minutes.  21.Ativan 1 mg p.o. t.i.d. p.r.n. anxiety.  22.Humidified O2 per trach collar through trach stoma to keep      saturations greater than/equal to 90%.   PROGNOSIS:  Fair.   ESTIMATED LENGTH OF STAY:  10-20 days.   GOALS:  Modified independent.  Feeding with minimal-to-moderate assist  ADLs and minimal assist transfers with standby assist to minimal assist  ambulation.           ______________________________  [name], M.D.     DC/MEDQ  D:  03/18/2007  T:  03/18/2007  Job:  [id number]

## 2010-10-25 NOTE — Consult Note (Signed)
NAME:  MABEL, UNREIN NO.:  [id number]   MEDICAL RECORD NO.:  [id number]          PATIENT TYPE:  INP   LOCATION:  5120                         FACILITY:  [facility]   PHYSICIAN:  Charlynne Pander, D.D.S.DATE OF BIRTH:  May 20, 1941   DATE OF CONSULTATION:  11/08/2006  DATE OF DISCHARGE:                                 CONSULTATION   [name] is a 70 year old male, referred by Dr. Jodi Geralds, for a  dental consultation.  The patient with a recent diagnosis of  supraglottic lung cancer with anticipated radiation therapy with Dr.  Lestine Box.  Patient is now seen as part of a pre-radiation therapy dental  protocol evaluation.   MEDICAL HISTORY:  1. Supraglottic laryngeal cancer.      a.     Status post surgery on Oct 17, 2006, with Dr. Jenne Pane for       tracheostomy, cancer resection, laryngectomy, and node dissection.       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  Patient with       anticipated postoperative radiation therapy with Dr. Lestine Box and       possible chemotherapy with consultation pending.  2. Status post ventilator dependent respiratory failure.  3. History of bilateral atelectasis and pneumonia, resolving.  4. History of coronary artery disease, status post recent stenting of      the circumflex coronary artery.  The patient has been on heparin,      aspirin, and Plavix therapies.  5. History of CVA in 2002, with left mild residual hemiparesis.  6. Hypertension, stable.  7. Diabetes mellitus, type 2, stable.  8. History of dyslipidemia, currently on Zocor.  9. Leukocytosis with previous use of steroids.  10.Current tube feedings this admission.   ALLERGIES:  NONE KNOWN.   MEDICATIONS:  1. Zyloprim 300 mg daily.  2. Aspirin 325 mg daily.  3. Dulcolax 10 mg daily.  4. Plavix 75 mg daily, currently on hold.  5. Colace 100 mg daily.  6. Pepcid 20 mg daily.  7. Novolin insulin per sliding scale.  8. Lantus insulin 4 units at bedtime.  9. Avelox 400 mg IV every 24 hours.  10.Senokot at bedtime.  11.Zocor 20 mg every evening.  12.Vancomycin 1500 mg IV every 24 hours.   SOCIAL HISTORY:  The patient is married.  The patient is disabled  secondary to his stroke in 2002.  The patient is a nonsmoker, nondrinker  at this time.   FAMILY HISTORY:  Positive for coronary artery disease.  Mother died of  lung cancer.   FUNCTIONAL ASSESSMENT:  The patient was independent for ADLs prior to  this admission.   REVIEW OF SYSTEMS:  This is reviewed from the chart and health history  assessment form for this admission.   DENTAL HISTORY.:   CHIEF COMPLAINT:  The patient needs a pre-radiation therapy dental  protocol evaluation.   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  The patient  did underwent surgical resection with Dr. Jenne Pane per his operative report  on  Oct 17, 2006.  The patient with anticipated radiation therapy with Dr.  Lestine Box.  The patient is now seen as part of pre-radiation therapy  dental protocol evaluation.   The patient currently denies acute toothache, swelling, or abscesses.  The patient has not seen a dentist for a while now.  The patient does  indicate that he does get regular dental care and has upper and lower  partial dentures.  The patient does not have the upper and lower partial  dentures with him and I am unable to assess them at this time.  The  patient indicates that they do fit pretty good.  I am unable to  determine the name of the dentist that he sees at this time.   DENTAL EXAMINATION:  GENERAL:  The patient is a well-developed, well-  nourished male in no acute distress.  VITAL SIGNS:  Blood pressure is 117/77, pulse 85, respirations are 20,  temperature is 98.9.  HEAD/NECK:  The right and left neck are noted to have drains from  previous head and neck surgery.  I am unable to palpate lymphadenopathy  at  this time.  The patient denies a history of previous TMJ symptoms.  INTRAORAL EXAM:  Patient with normal saliva.  There is no evidence of  abscess formation within the mouth at this time.  DENTITION:  Patient with remaining tooth numbers 7, 8, 9, 10, 11 as well  as 20, 21, 22, 27, and 28.  PERIODONTAL:  Patient with a history of chronic periodontitis with  plaque and calculus accumulations, gingival recession, and incipient to  moderate bone loss.  No significant tooth mobility is noted at this  time.  DENTAL CARIES:  There are no obvious dental caries noted at this time.  I would defer to periapical x-rays of the remaining teeth in an  outpatient setting.  ENDODONTIC:  The patient denies acute pulpitis symptoms.  There does not  appear to be any periapical pathology after review of the panoramic x-  ray through radiology.  CROWN OR BRIDGE:  There are no crown or bridge restorations.  PROSTHODONTIC:  The patient indicates that he has an upper and lower  cast partial denture.  The patient denies problems with the dentures.  The patient does not have the partial dentures with him and I am unable  to assess the clinical acceptability at this time.  This will hopefully  be done as an outpatient basis in the near future.  OCCLUSION:  The occlusion is less than ideal but may be acceptable with  a partial denture evaluation.   RADIOGRAPHIC INTERPRETATION:  A panoramic x-ray was taken through the  department of radiology on Nov 08, 2006.  There are multiple missing  teeth.  There is incipient to moderate bone loss.  There are no obvious  dental caries.  There are no obvious periapical radiolucencies.   ASSESSMENT:  1. Plaque and calculus accumulations.  2. Chronic periodontitis with incipient to moderate bone loss.  3. Selective areas of generalized gingival recession.  4. No significant tooth mobility.  5. Multiple missing teeth.  6. No obvious retained root tips or impacted teeth. 7. No  obvious dental caries at this time, although I would defer to      obtaining individual dental x-rays.  8. No history of acute pulpitis symptoms.  No obvious periapical      radiolucencies notd with Panoramic Xray.  9. History of upper and lower partial dentures which fit okay by  patient report.  Will need to assess these in the outpatient basis      as the patient does not have them with him here at [facility].  10.Poor occlusal scheme but apparently stable occlusion with the      existing upper and lower partial dentures.  11.History of Plavix therapy with a risk for bleeding with invasive      dental procedures.   PLAN/RECOMMENDATIONS:  1. I discussed the risks, benefits, and complication of various      treatment option with the patient in relationship to his medical      and dental conditions and anticipated radiation therapy, and      radiation therapy side effects to include xerostomia, radiation      caries, trismus, mucositis, taste changes, gum and jaw bone      changes, and risk for infection, bleeding, and osteoradionecrosis.      We discussed various treatment options to include no treatment,      total and subtotal extractions with alveoloplasty, periodontal      therapy, dental restorations, crown or bridge therapy, implant      therapy, root canal therapy, and replacing the missing teeth as      indicated after review of the existing upper and lower cast partial      denture.  We will also need to obtain individual dental x-rays in      the near future.  The patient currently wishes to proceed with      another examination and individual x-rays of the remaining teeth in      the dental clinic as an outpatient.  We will then determine the      overall course of treatment required for the patient in light of      his anticipated radiation therapy.  Most likely, the patient will      need periodontal therapy as well as impressions for upper and lower       fluoride carrier and scatter protection device fabrication.  2. Provided written and verbal information on Radiation Therapy and      Your Mouth-- today.  3. I will discuss findings with Dr. Jenne Pane, Dr. Lestine Box, and medical      oncologist as indicated for future cancer therapy.      Charlynne Pander, D.D.S.  Electronically Signed     RFK/MEDQ  D:  11/08/2006  T:  11/08/2006  Job:  [id number]   cc:   Antony Contras, MD  Durene Romans. Lestine Box, MD

## 2010-10-25 NOTE — H&P (Signed)
[name], [name] NO.:  [id number]   MEDICAL RECORD NO.:  [id number]          PATIENT TYPE:  INP   LOCATION:  3314                         FACILITY:  [facility]   PHYSICIAN:  Payton Doughty, M.D.      DATE OF BIRTH:  1941-02-08   DATE OF ADMISSION:  03/12/2007  DATE OF DISCHARGE:                              HISTORY & PHYSICAL   ADMITTING DIAGNOSIS:  Cervical myelopathy and subdural hematoma.   Called to see this 70 year old right-hand black gentleman with several  days history of increasing weakness of lower extremities.  Found by wife  this morning.  Unable to get out onto the floor, secondary to lower  extremity weakness.  Transferred to [facility] for extremities were not  antigravity.   PAST MEDICAL HISTORY:  Medical history is remarkable for laryngeal  carcinoma, status post resection in May.  He had radiation that ended  February 05, 2007 and he is questionably starting chemotherapy.  He has  also had a heart stent several months ago and is on Plavix.  He has gout  and a history of a left body right brain stroke.   MEDICATIONS:  Include:  1. Toprol.  2. Allopurinol.  3. Plavix.  4. Altace.  5. Hydrochlorothiazide.  6. Norvasc.  7. Pepcid.  8. Lipitor.  9. Vicodin.  10.Xanax.  11.NitroQuick.   FAMILY HISTORY:  Noncontributory.   SOCIAL HISTORY:  He does not smoke now.   PHYSICAL EXAMINATION:  GENERAL:  He is awake, does not have a headache.  On exam, he is awake and pleasant and oriented x3.  Obviously his larynx  does not work.  HEENT EXAMINATION:  He has a laryngectomy which appears to be well  healed.  Pupils equal, round, reactive to light.  Extraocular movements  are full.  Facies are equal.  Swallos are right.  Tongue is in the  midline.  NECK:  Radiation burns are obvious.  The tracheostomy is well healed.  CHEST:  Clear.  CARDIAC EXAMINATION:  Regular rate and rhythm.  ABDOMEN:  Soft with positive bowel sounds.  EXTREMITIES:  Within  clubbing, cyanosis.  Motor exam in the upper  extremities on the right side are 4/5.  He has a left side fixed in  flexion with a slight deltoid function.  The hip flexors, knee extensors  on the right are out.  Dorsiflexors are 1, plantar flexors are 1.  On  the left side, hip flexors and knee extensors are 2 and 1 respectively.  Dorsiflexors are 2, plantar flexors are 2.  Reflexes are 2 in the lower  extremities.  He had equivocal toes, down once, up once.   CT:  He has a right frontal subdural with about 5 mm thick with slight  shift.  Has a a falcine subdural on the right.  He also has about 2 left  frontal parietal hematomas, about 2 x 2 cm at the top of the motor  strip.  MRI of the thoracic spine was negative.  MRI of the C-spine  shows spondylosis of the cord flattening, abnormal signal  of C3-4 to  about C7.   ASSESSMENT:  Cervical myelopathy, secondary to a combination of  spondylosis and radiation injury.  Subdural will not require evacuation.  Some lower extremity weakness may be related to the left-sided hematoma  but the patient fell out primarily from his cord.   PLAN:  Place on steroids; admit.  If there is no improvements, could  consider decompression but if his primary injury is from radiation, it  is unlikely to improve.           ______________________________  Payton Doughty, M.D.     MWR/MEDQ  D:  03/12/2007  T:  03/13/2007  Job:  [id number]

## 2010-10-25 NOTE — Assessment & Plan Note (Signed)
[name] returns to clinic today for followup evaluation accompanied by  his wife.  The patient is a 70 year old African-American male with a  history of stroke and left hemiparesis, along with laryngeal cancer and  prior resection and tracheostomy in May 2008.  He was admitted March 12, 2007 after a fall with inability to stand.  CT of the brain showed  right subdural hemorrhage along with left intraparenchymal hemorrhage.  The patient was noted to have left lower extremity weakness at  admission.  The patient was seen by Dr. Channing Mutters from neurosurgery and Dr.  Anne Hahn.  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  He was treated with  steroids and serial CAT scans were done.  Last CAT scan March 13, 2007  showed large, subacute subdural hemorrhage, with 4.6 mm right to left  shift and mild interval decrease in thickness of subdural hemorrhage and  shift.  The patient was continued on Plavix and aspirin at that point.  The patient was moved to the Rehabilitation Unit March 18, 2007 and  remained there through discharge April 11, 2007.  At that time he was  transferred to [facility].  He has been there until  December 4 when he discharged home.  He has finished all home therapy at  this time.  He is independent with 13 steps on his own and is  independent with bathing and dressing, although he requires a little bit  of help with his shower.  He cooks his own breakfast and then feeds  himself.  He is due to see the [facility] this week for  possible laryngeal prosthesis.  He continues to be followed by his  primary care physician who is now Dr. Renae Gloss in place of Dr. Delanna Notice.   MEDICATIONS:  1. Allopurinol 300 mg daily.  2. Pepcid 20 mg daily.  3. Aspirin 81 mg daily.  4. Multivitamin 1 p.o. daily.  5. Colace 200 mg daily.  6. Zinc 220 mg daily.  7. Lopressor 25 mg daily.  8. New Iron 150  mg b.i.d.  9. Vitamin C 500 mg b.i.d.  10.Baclofen 10 mg daily.  11.Lipitor 10 mg daily.  12.Proventil nebulizer 3 times a day.  13.Ativan 1 mg t.i.d.  14.Hydrocodone 5/325 one tablet p.r.n.  15.Dulcolax 15 mg daily p.r.n.   REVIEW OF SYSTEMS:  Positive for limb swelling and coughing.   PHYSICAL EXAMINATION:  GENERAL:  This is a well-appearing, middle-aged,  elderly adult male seated in a manual wheelchair.  VITAL SIGNS:  Blood pressure is 105/66, with a pulse of 88, respiratory  rate 18 and O2 saturation 99% on room air.  NEUROLOGIC:  The patient has 5/5 strength throughout the right upper and  right lower extremity.  Left upper extremity strength was 0-1/5 and left  lower extremity strength was 3/5.   IMPRESSION:  1. Status post fall with subsequent intracerebral hemorrhage/subdural      hematoma, resolved.  2. At the present time, the patient has returned more or less to his      baseline which includes left hemiparesis after prior stroke.  He is      more or less completely recovered from the      intraparenchymal/subdural bleed.   We will plan on seeing the patient in followup on an as needed basis.  He will continue to see his primary care physician along with the [facility] as noted above.           ______________________________  Ellwood Dense, M.D.     DC/MedQ  D:  06/27/2007 10:39:12  T:  06/27/2007 11:28:54  Job #:  [id number]

## 2010-10-25 NOTE — Letter (Signed)
Nov 09, 2009   Mohamed K. Arbutus Ped, MD  [address]  [place], [place] [address]   Re:  [name], [name]                 DOB:  Nov 25, 1940   Dr. Arbutus Ped,   I appreciate the opportunity of seeing the patient.  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  He has multiple  medical problems.  He developed a subdural hematoma and a right brain  stroke, leaving him with a contraction of his left upper arm and  weakness in his left leg.  In 2006, he had a laryngectomy first  supraglottic cancer by Dr. Jenne Pane, underwent radiation and chemotherapy.  He has been followed and is now found to have an enlarging lesion in the  anterior segment of the right upper lobe that on CAT scan is positive.  He is referred here for evaluation.  Lesion is up to 0.7, this again was  positive on PET scan.  It is very anteriorly.  He also had some question  of some liver mets, but these were negative on PET scan and a liver  biopsy was negative.  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  He is referred here for evaluation.   MEDICATIONS:  1. Metoprolol 50 mg day.  2. Aspirin,  3. Baclofen at night.  4. Simvastatin 10 mg daily.  5. Lorazepam 0.5 t.i.d.  6. Benadryl 75 mg twice a day.   ALLERGIES:  He has no allergies.   FAMILY HISTORY:  Noncontributory.  He also had a stent to the right circ  in 2008.   SOCIAL HISTORY:  He is married, retired.  Quit smoking in 2008.  He  drinks 3 glasses of wine a week.   REVIEW OF SYSTEMS:  GENERAL:  He is 180 pounds, 5 feet 7 inches.  His  weight has been stable.  CARDIAC:  See past medical history.  No recent angina.  PULMONARY:  No bronchitis, hemoptysis, or asthma.  GI:  No nausea, vomiting, constipation, diarrhea.  GU:  No kidney disease, dysuria, or frequent urination.  VASCULAR:  See history of present illness.  No DVT.  NEUROLOGIC:  No dizziness, headaches, blackouts, or seizures.  MUSCULOSKELETAL:  Arthritis.  PSYCHIATRIC:  No  depression or nervousness.  No changes in eyesight or  hearing.  HEMATOLOGIC:  No problems with bleeding, clotting disorders, or anemia.   PHYSICAL EXAMINATION:  General:  He is a well-developed [place]  male in a wheelchair.  Vital Signs:  Blood pressure is 155/89, pulse 81,  respirations 18, sats were 93%.  HEAD, Eyes, Ears, Nose, and Throat:  Unremarkable.  Neck:  There is a right radical neck dissection and he  has a permanent laryngeal stone which he talks with a device.  Lungs:  Clear to auscultation and percussion.  Heart:  Regular sinus rhythm.  No  murmurs.  Abdomen:  Soft.  There is no hepatosplenomegaly.  Extremities:  Pulses are 2+.  He has a right and left arm contracture and left leg  contracture.  Neurologic:  He has got decreased weakness on the left  side of his CVA.  Sensory and motor intact.   ASSESSMENT AND PLAN:  I feel that the patient might not be the best  operative candidate.  Given his multiple medical problems of cardiac  disease, cerebrovascular disease, and his total laryngectomy, he would  be a very difficult case to  do a wedge resection on for considering  this, I would think we need to have a diagnosis and so I have elected to  a needle biopsy.  Even though it is about 0.7 to 1 cm, I am hoping that  a biopsy can be done so we can confirm what needs to be done.  Other  option is to do SBRT.  I will see him back again after his needle  biopsy.   Ines Bloomer, M.D.  Electronically Signed   DPB/MEDQ  D:  11/09/2009  T:  11/10/2009  Job:  [id number]   cc:   Antony Contras, MD

## 2010-10-25 NOTE — Discharge Summary (Signed)
NAME:  [name], [name] NO.:  [id number]   MEDICAL RECORD NO.:  [id number]          PATIENT TYPE:  INP   LOCATION:  5120                         FACILITY:  [facility]   PHYSICIAN:  Herbie Saxon, MDDATE OF BIRTH:  01-28-1941   DATE OF ADMISSION:  10/14/2006  DATE OF DISCHARGE:                               DISCHARGE SUMMARY   NOTE:  This is for the events from May 21 to Nov 06, 2006.  Refer to the  interim discharge summary dictated by Dr. Jayme Cloud.   DIAGNOSES:  1. Left parotid mass, supraglottic laryngeal carcinoma with upper      airway obstruction, which has improved.  2. He is status post tracheostomy, status post vent-dependent      respiratory failure.  3. Bilateral atelectasis and pneumonia which is improving on      ceftazidime and Avelox.  4. History of coronary artery disease on heparin drip, aspirin, and      beta blocker.  5. Chronic cerebrovascular accident with left hemiparesis.  6. Hypertension, stable.  7. Diabetes, stable.  8. Dyslipidemia on Zocor.  9. Leukocytosis on steroids.   SUMMARY:  The patient has been transferred from the intensive care unit  and his Plavix was restarted on Nov 01, 2006.  However, he was noticed  to be having bleeding from the JP drain on Nov 05, 2006, needing heparin  infusion to ge stopped.  Bleeding has subsided and his NG tube has been  removed.  He is continuing with the tube feeding and gentle IV normal  saline hydration.  Diabetes is optimally controlled with labs today  showing a glucose of 132, sodium 138, potassium 4.3, chloride 102,  bicarbonate 29, BUN 17, creatinine 0.4.  WBC is 16, hematocrit 31,  platelet count is 289.  The patient is anxious to be discharged home but  this is going to be after ENT clears the patient.   RADIOLOGY:  Chest x-ray of Oct 28, 2006 shows relatively stable  bilateral atelectasis.   DISCHARGE MEDICATIONS:  His medications will be dictated on final  discharge.   PHYSICAL EXAMINATION:  GENERAL:  On examination today, he is an elderly  male, in no acute distress.  VITAL SIGNS:  Temperature 99.8, pulse 80,  respiratory rate is 20, blood pressure 108/64.  HEENT:  Still clinically non-jaundiced.  NECK:  Bilateral JP drains in neck, status post trach.  The stoma is  neat,right parotid swelling.  CHEST:  He has reduced breath sounds at the bases.  HEART: Sounds 1 and 2 with a 3/6 systolic murmur at the left sternal  border.  ABDOMEN:  Soft, nontender, he has truncal obesity, no organomegaly.  EXTREMITIES:  Peripheral pulses present, no pedal edema.  NEUROLOGIC:  He is alert, oriented x3.   This is an interim discharge summary and it is to be updated on final  discharge.      Herbie Saxon, MD  Electronically Signed     MIO/MEDQ  D:  11/06/2006  T:  11/06/2006  Job:  [id number]

## 2010-10-25 NOTE — Discharge Summary (Signed)
NAMEABDULAI, [name] NO.:  [id number]   MEDICAL RECORD NO.:  [id number]          PATIENT TYPE:  INP   LOCATION:  3020                         FACILITY:  [facility]   PHYSICIAN:  Payton Doughty, M.D.      DATE OF BIRTH:  06-20-1940   DATE OF ADMISSION:  03/12/2007  DATE OF DISCHARGE:  03/18/2007                               DISCHARGE SUMMARY   ADMITTING DIAGNOSIS:  Cervical myelopathy, subdural hematoma.   DISCHARGE DIAGNOSIS:  Cervical myelopathy, subdural hematoma.   OPERATIONS:  There were no operative procedures.   DISCHARGE STATUS:  Alive and well.   This is a 70 year old gentleman who was admitted with weakness of the  lower extremities.  I was called because he transferred to [facility].  He had lower extremities that were not antigravity.  He had laryngeal  carcinoma.  He had resection in May.  He had radiation in August and  question about starting chemotherapy.  He had a heart stent several  years ago and was on Plavix.  History of left body right brain stroke.   Medications were Toprol, allopurinol, Plavix, Altace,  hydrochlorothiazide, Norvasc, Pepcid, Lipitor, Vicodin, Xanax,  NitroQuick.  He does not smoke now.   PHYSICAL EXAMINATION:  GENERAL:  He did not have a headache.  He was  pleasant.  HEENT:  His larynx did not work because it had been resected.  NEUROLOGIC:  He had a spastic left hemiparesis, on the right side his  strength was out.  Motor exam in the upper extremities was 4/5.   A CT showed about a 5-mm subdural hematoma, slight shift.  He was  admitted and placed on steroids, also visited by the neurologist.  It  was felt that he may have had a radiation injury to his cord but did not  feel that operative intervention was appropriate.  A CT the next day  showed resolution of his frontal subdural hematoma.  Lower extremity  function was increasing in strength.  He was seen by the cardiologists,  who did not feel they needed to do  anything.  He continued to have  improvement in his lower extremity strength.  He was seen by speech, and  they began working with his microphone.  Decadron was tapered.  He  improved 4/5 in his hip flexors and knee extensors.  Dorsi- and plantar  flexors remained weak.  On October 6, it was felt that he was stable  enough for return to enter rehab.  He was transferred to the rehab  service March 18, 2007.   .           ______________________________  Payton Doughty, M.D.     MWR/MEDQ  D:  08/23/2007  T:  08/24/2007  Job:  [id number]

## 2010-10-25 NOTE — Letter (Signed)
November 24, 2009   Velora Heckler. Arbutus Ped, MD  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  I just think that with all his other medical  problems and stroke and his laryngectomy that the best way to treat this  would be with SBRT.  I will discuss this with you at cancer conference  and with Dr. Michell Heinrich and we will give the patient a call.  I  appreciate the opportunity of seeing the patient.   Sincerely,   Ines Bloomer, M.D.  Electronically Signed   DPB/MEDQ  D:  11/24/2009  T:  11/25/2009  Job:  [id number]   cc:   Lurline Hare, MD

## 2010-10-25 NOTE — Op Note (Signed)
NAME:  [name], HUMM NO.:  [id number]   MEDICAL RECORD NO.:  [id number]          PATIENT TYPE:  INP   LOCATION:  2550                         FACILITY:  [facility]   PHYSICIAN:  Suzanna Obey, M.D.       DATE OF BIRTH:  Oct 14, 1940   DATE OF PROCEDURE:  08/24/2007  DATE OF DISCHARGE:  08/24/2007                               OPERATIVE REPORT   PREOPERATIVE DIAGNOSIS:  Foreign body of the right mainstem.   POSTOPERATIVE DIAGNOSIS:  Foreign body of the right mainstem.   SURGICAL PROCEDURES:  Bronchoscopy with foreign body removal.   ANESTHESIA:  Local.   ESTIMATED BLOOD LOSS:  None.   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  He now  feels like he is not breathing as well as he did.  He is convinced it is  present, but the chest x-ray did not show any evidence of foreign body.  Fiberoptic exam of the trachea, however, revealed a right mainstem  foreign body that was definitely the TEP.  He was informed of the risk  and benefits of the procedure, and options were discussed.  All  questions were answered and consent was obtained.   OPERATION:  The patient was taken to the operating room and placed in  the supine position after some Diprivan sedation and lidocaine to the  trachea.  The fiberoptic scope, rigid, was placed into the trachea, and  the forceps for retrieving foreign body were placed and it was guided  into the right mainstem where the TEP was retrieved and brought out of  the trachea.  Re-examination revealed no evidence of further foreign  body or irritation of the mainstem.  He tolerated this well.  A #16-  French red rubber catheter was inserted into the TEP site and taped to  the skin for preservation of the fistula.   The patient was awakened and brought to recovery in stable condition.  Counts correct.  Thank you.            ______________________________  Suzanna Obey, M.D.     JB/MEDQ  D:  08/24/2007  T:  08/25/2007  Job:  [id number]

## 2010-10-25 NOTE — Op Note (Signed)
NAMEFERD, HORRIGAN NO.:  [id number]   MEDICAL RECORD NO.:  [id number]          PATIENT TYPE:  OIB   LOCATION:  5121                         FACILITY:  [facility]   PHYSICIAN:  Antony Contras, MD     DATE OF BIRTH:  [birth year]   DATE OF PROCEDURE:  07/19/2007  DATE OF DISCHARGE:  07/20/2007                               OPERATIVE REPORT   PREOPERATIVE DIAGNOSES:  1. Tracheal stomal stenosis.  2. Esophageal stenosis.  3. Status post laryngectomy.   POSTOPERATIVE DIAGNOSES:  1. Tracheal stomal stenosis.  2. Esophageal stenosis.  3. Status post laryngectomy.   PROCEDURES:  1. Tracheal stoma revision with Z-plasty.  2. Esophagoscopy with esophageal dilation.  3. Tracheoesophageal puncture.   SURGEON:  Antony Contras, MD   ANESTHESIA:  General endotracheal anesthesia.   COMPLICATIONS:  None.   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  He underwent radiation and  chemotherapy that he completed in August.  He has had gradual shrinking  of the size of his stoma and has had dysphagia that has been  progressing.  A barium swallow demonstrated a narrowed esophagus.  He is  interested in alternative speech methods other then his electrolarynx  and a speech prosthesis is being placed.   FINDINGS:  The stoma was found to be small, about dime sized.  A running  Z-plasty was performed advancing skin from the sides down into the  trachea.  This enlarged the stoma to approximately nickel size.  Upon  esophagoscopy there were no abnormalities seen.  The esophagus was  dilated to a 48 [place] Maloney dilator.  There was an area of  granulation looking tissue in the posterior left tracheal stoma that was  biopsied and sent for frozen section.  This returned as atypia with no  evidence of carcinoma.   DESCRIPTION OF PROCEDURE:  The  patient was identified in the holding  room and an informed consent having been obtained including a discussion  of risks, benefits, alternatives, the patient was brought to the  operative suite and put on the operating table in supine position.  Anesthesia was induced and the patient was intubated through his stoma  without difficulty.  The patient was placed on a shoulder roll and the  neck was prepped and draped in sterile fashion.  A marking pen was used  to mark the incisions including four triangular shaped flaps radiating  from the stoma with the base of each triangle distal from the stoma.  The area was injected with 1% lidocaine with 1:100,000 epinephrine.  Incisions were then made with a 15 blade scalpel and each flap was back  elevated using scissors.  Radial cuts were then made down the trachea in  the four positions and each flap was then secured to the, deep into the  trachea at the apex of each cut using 3-0 Vicryl suture.  The anterior  trachea was advanced out to the apex  of the skin incision and secured  with 3-0 silk suture.  The incisions were then closed with a combination  of 3-0 Vicryl down within the stoma and 3-0 silk on the skin.  At this  point, the area of granulation was examined and a cup forceps used to  take a biopsy.  This was sent for frozen section.  The bed was then  turned 90 degrees from anesthesia and a tooth guard was placed.  A 10  cervical esophagoscope was inserted and used to view the esophagus.  It  reached a point where there was scar but no mass or irregularity to the  mucosa.  The esophagoscope was then removed.  The [facility] dilators were  then passed starting at a 20 dilator and serially dilating up to a 48.  No blood was seen on any of the dilators.  At this point a 7  esophagoscope was passed down past the dilation area and then backed out  to above the stoma.  The stoma was transilluminated and the Atos trocar  was then used within the  stoma about 7 mm from the mucocutaneous  junction and was then pressed down into the stoma wall.  The  esophagoscope was used with the telescope to view the area.  The  endotracheal tube was backed out at times during this portion of the  procedure.  With difficulty expanding the esophagus, the 7 esophagoscope  was exchanged with the 10 esophagoscope.  This ultimately allowed  visualization of the tip of the trocar and it was then passed into the  esophagoscope.  The sharp portion of the trocar was removed and the wire  was then passed through the hollow portion.  The esophagoscope was then  removed and the wire was passed up through the mouth.  An 8 mm Atos  speech prosthesis was then attached to the wire and pulled back down and  through the puncture.  Hemostats were then used to pull the anterior  flange through the puncture and it was properly positioned.  The tag was  removed.  At this point, the endotracheal tube was replaced and the  patient returned back to anesthesia for wake-up.  He was extubated and  removed to the recovery room in stable condition.      Antony Contras, MD  Electronically Signed     DDB/MEDQ  D:  07/19/2007  T:  07/20/2007  Job:  [id number]

## 2010-10-25 NOTE — Consult Note (Signed)
NAMEELSIE, SAKUMA NO.:  [id number]   MEDICAL RECORD NO.:  [id number]          PATIENT TYPE:  IPS   LOCATION:  4007                         FACILITY:  [facility]   PHYSICIAN:  Bertram Millard. Dahlstedt, M.D.DATE OF BIRTH:  07-20-1940   DATE OF CONSULTATION:  04/08/2007  DATE OF DISCHARGE:                                 CONSULTATION   REASON FOR CONSULTATION:  Painful blisters on penis.   BRIEF HISTORY:  A 70 year old male, status post intracerebral infarcts,  who was in the rehab unit for therapy.  He has a catheter in place.  He  has been on steroids. He was noted to have painful penile ulcers  recently.  Urologic consultation is requested.   PAST MEDICAL HISTORY:  Significant for:  1. 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  2. History of CAD with prior PTCA.  3. History of gout.  4. Dyslipidemia  5. Type 2 diabetes.  6. Laryngeal cancer (had a laryngectomy tracheostomy in May 2008).   MEDICATIONS:  Allopurinol, Altace, HCTZ, amlodipine, Pepcid,  atorvastatin, aspirin, insulin, baclofen.  Recently completed a course  of Decadron.   SOCIAL HISTORY:  The patient is married.  He is not employed currently.   PHYSICAL EXAMINATION:  GENERAL:  Exam revealed a pleasant middle-aged  male.  Tracheostomy was in place.  ABDOMEN:  Abdomen was unremarkable.  GENITALIA:  Phallus was uncircumcised.  There was a significant amount  of exudate underneath the patient's foreskin which, when retracted,  revealed a fair number of fairly large, erythematous lesions consistent  with fungal balanitis.  Glans was essentially normal.  Mild urethral  discharge noted.  Catheter was in place.  Scrotal skin normal.  Testicles atrophic.   IMPRESSION:  Fungal balanitis.   PLAN:  1. Will order a cleansing with hydrogen peroxide twice a day and      application of Lotrimin cream.  2. Would keep the foreskin retracted as well, but keep dryer.  3. If the foreskin does become edematous, or  paraphimosis sets in,      would replace the foreskin after cleansing and Lotrimin placement.  4. Will reconsult p.r.n.      Bertram Millard. Dahlstedt, M.D.  Electronically Signed     SMD/MEDQ  D:  04/08/2007  T:  04/09/2007  Job:  [id number]   cc:   Ellwood Dense, M.D.

## 2010-10-25 NOTE — Consult Note (Signed)
NAME:  [name] NO.:  [id number]   MEDICAL RECORD NO.:  [id number]          PATIENT TYPE:  INP   LOCATION:  3314                         FACILITY:  [facility]   PHYSICIAN:  Marlan Palau, M.D.  DATE OF BIRTH:  Nov 04, 1940   DATE OF CONSULTATION:  DATE OF DISCHARGE:                                 CONSULTATION   NEUROLOGY CONSULTATION:   HISTORY OF PRESENT ILLNESS:  [name] is a 70 year old right-handed  black male born 30-Aug-1940, with a history of cerebrovascular  disease with a right brain middle cerebral artery distribution stroke in  2002 leaving him with a left hemiparesis.  Patient had been ambulatory  with a cane following the stroke.  The patient, however, comes to [facility] at this time following a fall at home.  The patient claims  that the right leg collapsed and he fell the ground.  The patient  underwent a CT scan of the head in the emergency room showing evidence  of a right subdural hematoma and a left intraparenchymal bleed in the  anterior cerebral artery distribution.  The patient has also had an MRI  scan of the cervical and thoracic spine showing evidence of a disk  protrusion at the C3-4 level with compromise of the spinal canal.  Flattening of the spinal cord and myelomalacia is seen.  The patient has  a history of squamous cell carcinoma with a laryngectomy and has had  chemotherapy and radiation therapy that was completed in August 2008.  Neurology was asked to see the patient for further evaluation.  The  patient himself reports no evidence of a Lhermitte phenomenon and has no  change in sensation to arms or legs.  No pain in the neck.  No pain down  the arms or legs.  The patient has not had any alteration in control of  the bowels or the bladder.  The patient reports that he has had some  change in his ability to walk over the last couple of days.   PAST MEDICAL HISTORY:  1. One new onset of right leg weakness  following a fall.  Right      subdural hematoma, left intraparenchymal of bleeding by CT.  2. Cervical spondylosis, C3-4, spinal stenosis with myelomalacia at      that level.  3. Laryngeal carcinoma, status post laryngectomy for squamous cell      carcinoma, status post chemotherapy, radiation therapy.  4. 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  5. History of pneumonia in the past.  6. Coronary artery disease, status post stenting procedure.  7. Hypertension.  8. Diabetes.  9. Dyslipidemia.  10.History of gout.   The patient has no known allergies.   Quit smoking.  The patient was smoking a half-pack of cigarettes daily.  Drinks alcohol on occasion in the form of wine.   CURRENT MEDICATIONS:  1. Allopurinol.  2. Amlodipine.  3. Aspirin 325 mg a day.  4. Hydrochlorothiazide.  5. Lipitor 20 mg daily.  6. Metoprolol.  7. Nitroglycerin.  8. Pepcid 20 mg twice daily.  9. The patient also takes Altace.   SOCIAL HISTORY:  This patient lives in the [place] area, is married,  has two children, both daughters, one with hypertension and obesity.  The patient is retired.   FAMILY MEDICAL HISTORY:  Of note, the father is still living in his 82s,  has hypertension and dementia.  Mother died with lung cancer.  The  patient has one brother who died with pancreatic cancer.  One sister  died with congestive heart failure, who was found unresponsive.   REVIEW OF SYSTEMS:  Notable for no recent fevers, chills.  The patient  does report a right-sided headache following the fall.  Denies any  changes in vision.  Denies any problems with neck pain, shoulder pain,  arm pain, leg pain.  Denies shortness of breath, chest pains, abdominal  pain.  Has not had any blackout episodes, seizure events.   PHYSICAL EXAMINATION:  VITALS:  Blood pressure is 109/72, heart rate  110, respiratory rate 24, temperature 99.0.  GENERAL:  The patient is a fairly  well-developed black male who is alert  and cooperative at the time of the examination.  HEENT:  Head is atraumatic.  Eyes:  Pupils are round, react to light.  Discs are flat bilaterally.  NECK:  Supple.  No bruits noted.  RESPIRATORY:  Clear.  CARDIOVASCULAR:  A regular rate and rhythm.  No obvious murmurs, rubs  noted.  EXTREMITIES:  Without significant edema.  Left arm is in flexion.  NEUROLOGIC:  Cranial nerves:  As above.  Facial symmetry is present.  The patient has full extraocular movements.  A prominent left homonymous  visual field deficit is noted.  Speech is notable that the patient is  dysphonic.  The patient uses an electrolarynx to talk.  The patient has  good pinprick sensation on the face bilaterally.  The patient reports  some decreased pinprick sensation on the left arm and left leg as  compared to the right.  Vibratory sensation is more symmetric.  The  patient has a sensory level right around the C4 level with hypesthesia  at this level anteriorly and posteriorly.  The patient has the arm in  flexion on the left.  No voluntary movement there to speak of though  there may be some minimal elevation of the arm, but good strength noted  on the right arm.  The patient has significant weakness in both legs  with a significant elevation in motor tone on the left leg.  Right leg:  He does not have antigravity movement even with knee support.  The  patient has prominent bilateral Babinski's.  Deep tendon reflexes are  somewhat brisk throughout.  The patient could perform finger-nose-finger  with the right arm but cannot really use the other extremities.  The  patient cannot be ambulated.   LABORATORY VALUES:  A white count of 15.9, hemoglobin of 14.6,  hematocrit 43.0, MCV of 83.1, platelets of 269.  INR 0.9.  Sodium 139,  potassium 3.7, chloride of 104, CO2 29, BUN of 9, creatinine 0.8.  CK  level is 161, MB fraction 6.1.   CT of the head as above.  Thoracic and cervical  spine MRI as above.   IMPRESSION:  1. History of subdural hematoma on the right, left intraparenchymal      hemorrhage.  2. Cervical spondylosis with C3-4 spinal stenosis and myelomalacia.  3. Squamous-cell carcinoma, status post laryngectomy and radiation  therapy to the head and neck area.   The patient may have multiple sources of gait disturbance and leg  weakness.  The patient appears to have a sensory level at the cervical 4  level.  The patient has reported some gait problems in the last couple  of days.  The patient may be having some problems with injury to the  cervical cord in part from compression and possibly in part from  radiation therapy.  However, the acute onset of weakness of the right  leg is likely related to the left intraparenchymal hemorrhages in areas  that all affect the leg.  Subdural hematoma needs be watched but likely  is not causing much in the way of symptoms currently.   PLAN:  1. Would treat with Decadron.  2. Physical and occupational evaluation.  3. Follow clinical examination.  4. Serial CT scans of the head.   The patient is currently followed by Dr. Trey Sailors.      Marlan Palau, M.D.  Electronically Signed     CKW/MEDQ  D:  03/12/2007  T:  03/13/2007  Job:  [id number]   cc:   [facility]  Payton Doughty, M.D.

## 2010-10-28 NOTE — H&P (Signed)
[place]  Patient:    [name], [name]                          MRN: [id number] Adm. Date:  06/23/00 Attending:  Miguel Aschoff, M.D. 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  He had been intending to schedule a continuity appointment with [facility], but had not yet been able to make these arrangements.  He had been feeling well until onset of symptoms.  This morning at approximately 3 a.m. when getting out of bed to use the bathroom (which is his routine secondary to one to two times nocturia per night), he fell to the floor and became aware of left-sided leg and arm weakness, unable to support his weight.  He experienced no diplopia, dysarthria, sensory change, or obvious ataxia, but did feel as though he had "disequilibrium."  He made it back to bed and slept fitfully; remained in the bed until finally decided to call EMS for transport to the emergency room which occurred at approximately 3 p.m.  Upon EMS arrival blood pressure was 220/140 with pulse of 100, respirations of 120.  [name] explains that he has had no change in his symptoms since onset early this morning.  He ate a banana with no difficulty, loss of coordination of oral or tongue or pharyngeal muscles, coughing or gagging.  REVIEW OF SYSTEMS:  Patient denies headache now or prior.  He has had no weight change.  Appetite has been good.  Has had no fevers or chills, upper respiratory symptoms, dysphagia, vision change, dyspepsia, ulcers, chest pain, shortness of breath, dyspnea on exertion, cough, change in bowel habits, dysuria,  joint pain, rash, edema, claudication.  PAST MEDICAL HISTORY:  Hypertension diagnosed three months ago.  C6 nerve impingement secondary to work related injury resulting in fixed deficit of right index finger numbness, but no motor impairment.  Currently seeking workers comp.  Remote left ankle fracture as a child.  Seasonal postnasal drip in winter.  MEDICATIONS:  Norvasc 5 mg p.o. q.d.  Does not take daily aspirin. Occasionally uses ibuprofen for his neck injury.  SOCIAL HISTORY:  Patient smokes half a pack per day and has smoked intermittently since early adulthood.  He is married with grown children.  A grandchild lives in the household.  His wife is Camille Bal, an Charity fundraiser on [facility].  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  FAMILY HISTORY:  CVA in maternal aunt and maternal grandmother.  Mother with hypertension.  PHYSICAL EXAMINATION:  VITAL SIGNS:  Blood pressure 208/127, pulse 90, sinus rhythm, respiration rate normal with normal oxygen saturation.  GENERAL:  He is a robustly built man with tendency to overweight.  He is in a jovial mood, in no acute distress, is ______ about the significance of his new deficit.  HEENT:  Eyes are clear with reactive pupils.  Fundi reveals some copper wiring.  He does have poor dentition.  Uvula is somewhat large, although not edematous.  Coloration of mucosal membranes is  normal.  NECK:  There is no JVD.  There are no carotid bruits.  EXTREMITIES:  Peripheral pulses at radial, dorsalis pedis, and posterior tibial are full and symmetric.  Thumb missing on left hand secondary to a childhood accident and chronic asymmetry of leg girth below the knee with left greater than the right, nontender, and non-edematous.  There is no lower extremity edema.  Feet are in good repair.  LUNGS:  Clear.  HEART:  Regular rate and rhythm with no murmur, rub, or gallop.  No S3 or S4 audible by my examination  in ER.  ABDOMEN:  Soft and nontender.  NEUROLOGIC:  Extraocular movements are full.  Visual field testing by gross confrontation shows no deficits.  Visual acuity to reading normal print preserved.  No nystagmus.  Face is symmetric including pharynx and tongue. There are no sensory or motor deficits.  Hearing is preserved.  Strength of the right upper and lower extremities is normal 5/5 in all major muscle groups.  On the left he exhibits somewhat poor control of the left arm, although shoulder abduction/adduction is estimated at 4-4+/5, biceps flexion 4/5, extension 3/5, wrist extension 3/5, flexion 3/5, and grip 4/5.  He has somewhat increased flexural tone in the left arm.  Lower extremity exhibits 4+/5 dorsiflexion, 5/5 plantar flexion, 4+/5 knee flexion, 4/5 knee extension, 4/5 hip flexion, 3+/5 extension.  Sensory examination:  Intact to touch throughout except for diminishment over the right index finger.  Reflexes: Hyperreflexic throughout bilaterally at the triceps, biceps, brachioradialis, patella, and Achilles with left only slight greater than the right.  Left toe is upgoing, right is downgoing.  He is ataxic with his left hand and finger-to-nose somewhat impaired by weakness, accurate on the right.  LABORATORIES:  Head CT noncontrast shows small vessel changes, but no acute findings.  EKG:  Normal sinus rhythm, non-ischemic.  WBC 12, hemoglobin 15.3, MCV 86, platelets 318.  Normal coagulation. Creatinine 1.1, potassium 3.4.  Remainder of laboratories unremarkable.  ASSESSMENT AND PLAN: 1. [name] has experienced an acute cerebrovascular accident with left    hemiparesis now many hours old with fixed deficit.  Likely hypertensive in    origin exacerbated by tobacco smoking.  His cholesterol is unknown and will    be checked.  Will begin aspirin daily.  Address modifiable risk factors    (cholesterol, tobacco).  Obtain MRI/MRA to further characterize    cerebrovascular  accident etiology.  Suspect lacuna.  He has motivation for     recovery and would be an excellent rehabilitation candidate in my opinion.    Will proceed with therapy. 2. Uncontrolled hypertension.  [name] is severely uncontrolled in the    setting of acute cerebrovascular accident.  Will attempt to lower systolic    blood pressure less than 180 and diastolic blood pressure less than 110    with clonidine patch.  This will be removed if systolic blood pressure    drops below 160 and diastolic blood pressure below 95 to prevent extension    of stroke.  Potassium will be added to intravenous fluids. DD:  06/23/00 TD:  06/23/00 Job: [id number]

## 2010-10-28 NOTE — Op Note (Signed)
NAME:  WEBB, WEED NO.:  [id number]   MEDICAL RECORD NO.:  [id number]          PATIENT TYPE:  INP   LOCATION:  3304                         FACILITY:  [facility]   PHYSICIAN:  Lucky Cowboy, MD         DATE OF BIRTH:  11/25/1940   DATE OF PROCEDURE:  11/01/2006  DATE OF DISCHARGE:                               OPERATIVE REPORT   PREOPERATIVE DIAGNOSIS:  Bilateral neck hematoma.   POSTOPERATIVE DIAGNOSIS:  Bilateral neck hematoma.   PROCEDURE:  Evacuation of bilateral neck hematoma.   SURGEON:  Lucky Cowboy, MD   ANESTHESIA:  General.   ESTIMATED BLOOD LOSS:  50 mL.   SPECIMENS:  None.   COMPLICATIONS:  None.   INDICATIONS:  This patient is a 70 year old male who underwent extensive  neck surgery including laryngectomy and bilateral neck dissections as  well as left parotidectomy on Oct 26, 2006.  Due to having suffered a  myocardial infarction 2 months ago, he has required postoperative  heparin therapy.  Minimal drain output was noted today and drains were  removed with a pressure dressing applied to the neck and head.  Later  this afternoon, he was noted to have a copious amount of blood on the  anterior portion of his gown.  Hematoma had developed in the right neck;  for this reason, he is returned to the operating room for evacuation.   FINDINGS:  The patient was noted to have a hematoma and diffuse bleeding  in both sides of the neck.  Both sides of the neck were evacuated.  Large drains were placed.   PROCEDURE:  The patient was taken to the operating room and placed on  the table in the supine position.  He was then placed under general  endotracheal anesthesia and the neck prepped with a Betadine and draped  in the usual sterile fashion.  The 7.5 endotracheal tube (anode) was  placed in the trachea and secured to the chest wall in a simple  interrupted fashion using 2-0 silk suture.  At this point, all staples  were removed.  The underlying Vicryl  connecting the platysma muscle was  divided.  Hematoma was evacuated and was found to be in all parts of the  neck.  Copious irrigation was performed in both sides of the neck.  No  discrete bleeding could be identified.  Gelfoam soaked in thrombin was  placed in both sides of the neck.  Platysma was reapproximated in a  simple interrupted buried fashion with 3-0 Vicryl.  The skin was closed  using staples after large 19-French fluted drains were placed in each  side of  the neck and secured in a pursestring fashion anterior in the chest  wall.  The drains were placed to suction.  Staples were placed in the  skin.  Bacitracin ointment was placed.  The patient was then awakened  from anesthesia and taken to the postanesthesia care unit in stable  condition.  There were no complications.      Lucky Cowboy, MD  Electronically Signed  SJ/MEDQ  D:  11/01/2006  T:  11/02/2006  Job:  [id number]   cc:   Ladora Daniel, Nose and Throat

## 2010-10-28 NOTE — Consult Note (Signed)
NAMEESTANISLAO, HARMON NO.:  [id number]   MEDICAL RECORD NO.:  [id number]          PATIENT TYPE:  INP   LOCATION:  2105                         FACILITY:  [facility]   PHYSICIAN:  Antony Contras, MD     DATE OF BIRTH:  Nov 08, 1940   DATE OF CONSULTATION:  10/15/2006  DATE OF DISCHARGE:                                 CONSULTATION   CHIEF COMPLAINT:  Upper airway obstruction.   HISTORY OF PRESENT ILLNESS:  The patient is a 70 year old African  American male who presented to the emergency department yesterday in no  acute respiratory failure.  After failing BiPAP therapy in the emergency  department, he was electively intubated by the Anesthesia team.  Intubation was difficult due to difficulty visualizing the vocal cords  and the suggestion of a mass in the airway.  He had to be intubated  using an intubating stylet.  A large mass was noted in the epiglottis  region.  Bleeding ensued, but has stopped.  He remains intubated in the  intensive care unit on the mechanical ventilator.  The cardiologist team  has seen the patient and there are no plans for additional therapy at  this time.  His shortness of breath began in March when he ended up  passing out requiring CPR.  He went to the emergency department and  ultimately had a cardiac catheterization that resulted in a stent.  Even  after that procedure, his breathing has not been normal with gradual  worsening of difficulty breathing.  His wife describes him having loud  snoring when trying to sleep as well as gasping for air.  He was unable  to lie supine to sleep because his breathing would worsen.  He also has  decreased appetite, decreased energy, and bloody sputum that he has been  coughing up.  He was treated with a Z-Pak and Levaquin by his primary  doctor without much improvement.  He did not have any pain.  Shortness  of breath has gradually worsened until this weekend his color had  worsened and his wife was  worried that he was going to die.   PAST MEDICAL HISTORY:  1. Coronary artery disease.  2. Aortic stenosis.  3. 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   PAST SURGICAL HISTORY:  Cardiac catheterization with stent placement.   MEDICATIONS:  Plavix, aspirin, Norvasc, Lipitor, Altace, allopurinol and  nitroglycerin   ALLERGIES:  NO KNOWN DRUG ALLERGIES.   FAMILY HISTORY:  Lung cancer, asthma and hypertension.   SOCIAL HISTORY:  The patient lives in [place] with his wife.  He has  been disabled since his stroke.  He has been drinking red wine socially  for some time.  He smokes half to one pack of cigarettes per day for the  last 30-40 years, but quit in March.   REVIEW OF SYSTEMS:  Unable to be obtained due to the patient being  intubated.   PHYSICAL EXAMINATION:  VITAL SIGNS:  Temperature 100.5, pulse 104, blood  pressure 138/65.  GENERAL:  The patient is sedated and  intubated.  He is in no acute  distress.  VOICE:  Unable to be examined.  EARS:  External ears are normal.  External auditory canals have minimal  cerumen.  Tympanic membranes are intact.  Middle ear spaces are aerated.  EYES:  The patient's eyes are closed as he is sedated.  NOSE:  External nose is normal.  Nasal passages are patent with an NG  tube in place.  FACE.  There is no face or head abnormality.  ORAL CAVITY:  The patient has an endotracheal tube in place.  The  anterior oral cavity was able to be examined with a normal-appearing  tongue and scattered dentition.  The posterior oral cavity and  oropharynx was unable to be examined.  CRANIAL NERVES:  Unable to be examined due to sedation.  NECK:  There is a 3 cm round mass in the left parotid gland.  The neck  is obese without obvious palpable mass given the patient's position.  THYROID:  Not enlarged.  CHEST:  Breath sounds are equal.  CARDIOVASCULAR:  Rate is regular.   LABORATORY DATA:  White blood count 12.3, hemoglobin  11.4, platelets  272.   RADIOLOGY:  A CT scan of the neck with IV contrast performed on Oct 14, 2006 was personally reviewed.  This demonstrates a 2.7 x 3.2 x 2.4 cm  left parotid mass in the superficial parotid.  There is 2.4 x 2.9 cm  mass in the right zone II neck as well as a 2.0 cm mass in the left zone  II neck.  There is no other obvious adenopathy.  The soft tissues of the  pharynx and larynx are difficult to determine due to the patient being  intubated.  However, there is fullness in the hypopharynx.   ASSESSMENT:  The patient is a 70 year old African American male with  respiratory distress that appears to be due to upper airway obstruction  with a possible obstructing mass.  The patient also has a left parotid  mass and cervical lymphadenopathy.  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   PLAN:  The patient's Plavix is being held currently and he is on heparin  in anticipation of potential surgery.  This should be continued.  For  the time being, I will wait until the patient has been stabilized from a  respiratory failure standpoint.  When he is stable for general  anesthesia, I plan to perform panendoscopy with possible biopsy prior to  any attempt to extubate him.  Airway management at that point may  include extubation after panendoscopy if there is no obstructing mass  found, tracheostomy, or potential definitive cancer surgery if found.  Definitive surgery or tracheostomy would best be delayed until Plavix  has run out of his system.      Antony Contras, MD  Electronically Signed     DDB/MEDQ  D:  10/15/2006  T:  10/16/2006  Job:  [id number]

## 2010-10-28 NOTE — Discharge Summary (Signed)
[facility]  Patient:    [name], [name]                          MRN: [id number] Adm. Date:  [id number] Disc. Date: [id number] Attending:  Herold Harms Dictator:   Dian Situ, PA CC:         Marlan Palau, M.D.  Thora Lance, M.D.   Discharge Summary  DISCHARGE DIAGNOSES: 1. Status post right middle cerebral artery infarct. 2. Hypertension. 3. Degenerative joint disease.  HISTORY OF PRESENT ILLNESS:  [name] is a 70 year old male with a history of hypertension, admitted on June 23, 2000, with left-sided weakness and elevated blood pressure of 220/140.  An CT done showed no acute abnormality. The patient was started on IV heparin.  Carotid Doppler showed no ICA stenosis.  An MRI/MRA showed acute right inferior infarct with moderate small vessel disease and no stenosis.  Echocardiogram showed EF normal and mild aortic stenosis.  Marlan Palau, M.D., was consulted and recommended an aspirin per day.  The patient has had complaints of left elbow and left knee pain.  X-rays done were negative.  This was tapped by Trudee Grip, M.D., and was positive for crystals.  The patient is currently on Indocin and colchicine with decrease in pain and effusion.  Blood pressure medications continue to be adjusted for tighter control.  Currently the patient is at minimal assist for transfers and moderate assist for ambulating 10 feet with a hemiwalker.  PAST MEDICAL HISTORY:  Significant for hypertension, DJD, and minor work-related injury to the right neck.  ALLERGIES:  There are no known drug allergies.  SOCIAL HISTORY:  The patient was living with his wire in a two-level home and was independent prior to admission.  He does not use any alcohol.  He smokes half of a pack per day.  HOSPITAL COURSE:  [name] was admitted to rehabilitation on June 29, 2000.  Inpatient therapies to consist of PT and OT daily.   Post admission, the patient has been maintained on aspirin for CVA prophylaxis. Speech therapy evaluated the patient for high level cognition and the patient demonstrated attention for 45 minutes on complex task, as well as intellectual awareness of physical ______.  No dysphagia noted and speech therapy signed off.  The patient was maintained Indocin for gout through June 30, 2000. Blood pressures were monitored on a b.i.d. basis.  The patient was taken off hydrochlorothiazide and currently is on Norvasc 10 mg per day.  The blood pressures at the time of discharge are ranging from 130-140 systolic and 80s-90s diastolic.  The patient has been afebrile during his stay.  No bowel or bladder issues reported.  At the time of admission, the patient was noted to have 3/5 strength at the shoulder with grips of 0-5.  The patient has had increased movement in his left upper extremity and is able to use and incorporate his left upper extremity in activities grossly.  Labs were checked at admission, showing hemoglobin 15.0, hematocrit 42.8, white count 10.8, and platelets 329.  Check of electrolytes have essentially been within normal limits.  The last check on July 16, 2000, showed sodium 140, potassium 3.6, chloride 101, CO2 30, BUN 15, creatinine 0.8, glucose 100, AST 17, ALT 23, alkaline phosphatase 40, and total bilirubin 0.4.  By the time of discharge, the patient was at modified independent level for ADL needs, including simple household skills.  He is modified independent for bed mobility, supervision for transfers, and close supervision for ambulating 100 feet with short-based quad cane.  His standing balance is decreased on the left secondary to left knee occasionally buckling with some loss of balance.  He will continue to get progressive PT and OT at [facility]. [facility] outpatient rehabilitation post discharge.  DISPOSITION:  On July 18, 2000, the patient is  discharged to home.  DISCHARGE MEDICATIONS: 1. Ecotrin 325 mg p.o. per day. 2. Norvasc 10 mg per day. 3. Lipitor 10 mg p.o. q.h.s. 4. Viagra 25 mg maximum per day.  To discharge for any chest pain or shortness    of breath.  ACTIVITY:  Use a wheelchair and cane.  DIET:  Low fat.  SPECIAL INSTRUCTIONS:  No alcohol.  No smoking.  No driving.  No sexual activity for another two weeks.  FOLLOW-UP:  The patient is to follow up with Thora Lance, M.D., for an appointment on July 25, 2000, at 2:15 a.m.  Follow up with Reuel Boom L. Thomasena Edis, M.D., on August 15, 2000, at 10:30 a.m. DD:  07/18/00 TD:  07/19/00 Job: [id number]

## 2010-10-28 NOTE — Discharge Summary (Signed)
[place]  Patient:    [name], [name]                          MRN: [id number] Adm. Date:  [id number] Disc. Date: [id number] Attending:  Anastasio Auerbach CC:         [facility]             Claude Manges. Cleophas Dunker, M.D.             Marlan Palau, M.D.                           Discharge Summary  DATE OF BIRTH:  Oct 21, 1940  DISCHARGE DIAGNOSES: 1. Acute right brain cerebrovascular accident.    a. Left hemiparesis, arm greater than leg. 2. Hypertension. 3. Acute gouty arthritis.    a. New onset this admission. 4. Tobacco use. 5. C6 nerve impingement secondary to work related injury. 6. Remote left ankle fracture. 7. Seasonal postnasal drip (winter). 8. Hypercholesterolemia.  DISCHARGE MEDICATIONS: 1. Norvasc 7.5 mg q.d. 2. Enteric-coated aspirin 325 mg q.d. 3. Hydrochlorothiazide 12.5 mg p.o. q.d. 4. Altace 2.5 mg p.o. q.d. 5. Lovenox 40 mg subcu q.d. 6. Colchicine 0.6 mg p.o. b.i.d. 7. Pepcid 20 mg p.o. b.i.d. 8. Lipitor 10 mg q.d.  DISCHARGE CONDITION:  Stable.  Strength 2-3/5 in the left lower extremity, left upper extremity flaccid, dysarthria resolved, subtle left lower facial droop.  DISPOSITION:  Transferred to inpatient rehab.  RECOMMENDED ACTIVITY:  Per physical therapy and occupational therapy in the rehab unit.  RECOMMENDED DIET:  Low fat, low salt as tolerated.  RECOMMENDED FOLLOW-UP:  Hospital will be following B-MET and blood pressure during the rehab stay.  [name] will be following up with one of the physicians at [facility] for an inpatient appointment after discharge from the rehab unit.  They will be following his blood pressure and other health issues.  CONSULTANTS: 1. Lesia Sago, M.D. 2. Norlene Campbell, M.D. 3. Rehab physician.  PROCEDURES: 1. Head CT June 23, 2000, no acute intracranial abnormality.  Mild changes    of small vessel disease. 2. EKG June 23, 2000, normal sinus rhythm,  nonspecific Q-wave    abnormalities. 3. Carotid Dopplers June 25, 2000.  No evidence of significant    intracranial artery stenosis.  Vertebral flow antegrade. 4. MRI/MRA brain June 25, 2000.  Acute right middle cerebral    artery infarction affecting the posterior limb internal capsule,    posterior centrum semiovale and posterior thalmus.  Mild atrophy,    moderate changes of small vessel disease, most consistent with the    patients history of previous hypertension.  Angiography    demonstrates mild intracranial atherosclerotic change without    significant proximal stenosis. 5. Two-dimensional echocardiogram (June 26, 2000):  Overall LV    systolic function normal.  Wall motion cannot be assessed.  Mild    asymmetric hypertrophy.  Trivial/mild aortic valve stenosis.  Mean    gradient 12.5 mmHg (discussed with Dr. Mayford Knife who read the echo.    Very mild if at all and would not recommend any kind of specific    follow-up).  Left atrium mildly dilated, right atrium mildly dilated. 6. Left knee aspiration June 27, 1999, birefringent crystals present.  HOSPITAL COURSE: #1 - ACUTE RIGHT BRAIN CEREBROVASCULAR ACCIDENT:  [name] is a 70 year old African-American gentleman with a history of hypertension who presented with left  arm and left leg weakness.  His blood pressure on presentation was 200/127 with a heart rate of 70.  On presentation, his left arm was flaccid and he had 3-4/5 strength in his left leg.  He was mildly dysarthric with a slight left lower facial droop as well.  He was started on aspirin and IV heparin.  A 2-D echo and carotid Dopplers were unremarkable.  MRI/MRA revealed a fairly large distribution lacunar stroke consistent with thrombotic CVA. There was some mild intracranial atherosclerotic disease.  I did get Lesia Sago to see the patient to help with interpretation of this, and he felt aspirin was appropriate anticoagulation and was not highly  suspicious of embolic disease.  [name] symptoms did progress, and he developed worsening left lower extremity weakness.  This was aggravated by the fact that he developed new onset polyarticular gout involving the left elbow and the left knee on June 25, 2000.  At the time of discharge, he was ambulating with an assistive device but had no movement in the left upper extremity.  He will be transferred to the rehab unit for further physical therapy.  #2 - HYPERTENSION:  [name] was on Norvasc prior to admission.  His blood pressure was still elevated despite increasing the Norvasc to 7.5 mg.  We ultimately added an Ace inhibitor and a diuretic.  There is evidence that ACE inhibitors in cerebrovascular disease are beneficial.  #3 - HYPERLIPIDEMIA:  [name] had normal LFTs with a cholesterol of 191, triglycerides 143, HDL 52, and LDL 115.  I think our goal in this gentleman would be to have an LDL of less than 110, and I would place him on Lipitor 10 mg daily.  Follow up liver tests and cholesterol as an outpatient.  #4 - ACUTE POLYARTICULAR GOUT:  [name] had crystal proven gout.  His uric acid level was only 5.6 but he responded to Indocin and was placed on colchicine maintenance.  It is unclear what triggered this episode.  There have been reports of low dose aspirin and he was started on 325 mg daily. Regardless, his symptoms are much improved at the time of discharge.  We only used Indocin for three days.  Could consider using allopurinol as an outpatient.  #5 - TOBACCO USE:  Counseled on cessation.  DISCHARGE LABORATORY DATA:  Discharge B-MET with sodium 136, potassium 3.7, chloride 100, bicarbonate 26, BUN 18, creatinine 0.7, glucose 126.  Hemoglobin 12.7, WBC 13,300, platelet count 242,000. DD:  07/16/00 TD:  07/16/00 Job: [id number]

## 2010-10-28 NOTE — Op Note (Signed)
[name], TOMES NO.:  [id number]   MEDICAL RECORD NO.:  [id number]          PATIENT TYPE:  INP   LOCATION:  2105                         FACILITY:  [facility]   PHYSICIAN:  Antony Contras, MD     DATE OF BIRTH:  02-10-1941   DATE OF PROCEDURE:  10/26/2006  DATE OF DISCHARGE:                               OPERATIVE REPORT   PREOPERATIVE DIAGNOSIS:  1. 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  2. Left parotid mass.   POSTOPERATIVE DIAGNOSIS:  1. 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  2. Left parotid mass.   PROCEDURE:  1. Total laryngectomy.  2. Bilateral selective neck dissections including zones 2 through 4.  3. Left superficial parotidectomy with nerve dissection.   SURGEON:  Christia Reading, M.D.   ASSISTANT:  Kinnie Scales. Annalee Genta, M.D.  Lucky Cowboy, M.D.   ANESTHESIA:  General endotracheal anesthesia.   COMPLICATIONS:  None.   INDICATIONS:  The patient is a 70 year old African American male who  presented to the emergency department on May 4 with acute airway  obstruction and was electively intubated.  This followed a several month  history of progressive shortness of breath and decreased appetite as  well as a bloody cough.  During intubation, a mass was noted in the  supraglottic larynx by the anesthesia team.  He was successfully  intubated and has been on a ventilator for airway control.  Imaging  demonstrated a left parotid mass as well as bilateral cervical  lymphadenopathy as well as the suggestion of a supraglottic mass.  He  was taken to the operating room on May 7 for panendoscopy with biopsy  and results demonstrate squamous cell carcinoma of the supraglottic  larynx.  After discussing options including surgical therapy followed by  radiation treatments and organ sparing protocol with chemo and radiation  as well as surgery, the patient has chosen to proceed with surgical  resection of the cancer followed by  radiation treatments.  He presents  to the operating room for surgical management.   FINDINGS:  The patient has a large mass in the left inferior parotid  gland.  Upon neck dissection, there were large nodes in the zone 2  region on both sides but no other significantly enlarged lymph nodes.  After removing the larynx, the tumor was examined and found to involve  the bulk of the laryngeal surface of the epiglottis with protrusion  through the epiglottis into the vallecula.  Frozen section margins from  the tongue base were sent and found to be negative for carcinoma.   DESCRIPTION OF PROCEDURE:  The patient was identified in the holding  room, informed consent having been obtained, the patient was moved to  the OR suite and placed on the operating table in a supine position.  Anesthesia was induced and the patient was maintained via endotracheal  anesthesia.  A shoulder roll was placed and the nerve integrity monitor  was placed on the left side of the face in standard fashion and turned  on for the case.  The left face and bilateral  neck was prepped and  draped in a sterile fashion.  The incision was marked with a marking pen  including a preauricular incision on the left side extending into a  bilateral utility incision incorporating the stomal incision centrally.  The incision was injected 1% lidocaine with 1:100,000 epinephrine.   An incision was then made starting on the left side using a 15 blade  scalpel.  Bovie electrocautery was used to extend the incision through  the subcutaneous layers as well as the platysma layer of the neck.  A  subplatysmal flap was elevated in a superior direction as was a  subcutaneous flap over the parotid gland.  Dissection was then performed  posteriorly along the tragal cartilage as well as around the tail of the  parotid inferior to the ear.  The sternocleidomastoid muscle was exposed  and dissected and the digastric muscle was also identified and  dissected  in an anterior direction as the tail of the parotid was elevated.  Dissection was then performed anterior to the tragus until the main  branch of the facial nerve was identified and was confirmed by nerve  stimulation at 0.5 mA.  The superior division was then dissected and the  gland divided using bipolar electrocautery and scissors.  This was  continued around the superior end of the gland above the mass.  Each of  the nerve branches was traced and the gland elevated toward the mass.  This proceeded until the marginal branch was also dissected and the  gland was able to be reflected inferiorly into the neck.   At this point, the apron incision was extended on the right side  including around the stoma using the 15 blade scalpel followed by Bovie  electrocautery.  The flap was then elevated in a subplatysmal plane on  both sides exposing the underlying cervical tissues.  The flap was then  sewn superiorly using stay sutures.  The marginal nerve branch was  identified on the right side, as well, and was traced keeping it intact  and safe.  The right side sternocleidomastoid muscle was then  skeletonized using Bovie electrocautery down deep into the neck.  This  extended from the mastoid tip down to the clavicle.  The eleventh nerve  was then dissected superiorly exposing the digastric muscle and  dissecting it centrally.  Zone 2B was then elevated using Bovie  electrocautery and retracted inferiorly to the eleventh nerve.  The  cervical roots were then identified in the lateral neck and soft tissue  was elevated in a medial direction over the cervical roots toward the  major vessels.  At this point, the jugular vein was traced and an  aberrant vessel was noted coming posteriorly from the jugular vein.  Dissection continued inferiorly toward the root of the jugular vein. The vein was perforated in the lower neck and bleeding was controlled  with clamps.  Further dissection was  performed, dissecting all the soft  tissues off of the jugular vein and then extending that toward the  carotid artery.   When the perforation was made, the patient was placed in Trendelenburg  position.  At this point, the clamps were exchanged for a vascular  clamp.  The perforation was then closed with 4-0 Prolene in a running  locked fashion.  The clamp was removed and no bleeding was encountered.  Soft tissues were then elevated dissecting the carotid artery and vagus  nerve exposing them completely and elevating the soft tissues toward the  larynx.  Elevation of the enlarged lymph node in the zone 2 region  involved careful dissection off of the jugular vein where it came off  readily.  Superiorly, the twelfth nerve was identified and traced in a  medial direction.  The soft tissues were again elevated exposing the  long digastric muscle.  The submandibular gland was left intact as  dissection continued anteriorly to the submental space where fat was  also dissected free.  The same procedure was then carried out in the  left neck including skeletization of the sternocleidomastoid muscle and  elevation of the lateral soft tissues.  The internal jugular vein was  then traced and the lateral zones 2, 3, and 4, were separately dissected  and removed by carefully dissecting out the eleventh nerve and  dissecting over the cervical roots.  Zone 2B was included using Bovie  electrocautery.  The vein was then fully dissected as was the parotid  and carotid artery and vagus nerve.  Once again, the twelfth nerve is  identified superiorly and the digastric muscle was dissected.  A hole  was made in the facial vein superiorly and that was tied and ligated.   At this point, the soft tissues of the stoma were divided using Bovie  electrocautery exposing the sternal notch region.  This was extended  down to the trachea by going through strap muscles on both sides.  The  trachea was then dissected  up toward the thyroid isthmus and the thyroid  isthmus was then divided and ligated.  Each thyroid lobe was then  reflected laterally exposing the trachea.  An incision was then made  through the anterior tracheal wall between rings 2 and 3 and extended  with scissors slightly.  A single 2-0 nylon suture was then placed  around the ring below the incision and extended to the skin of the  stoma.  At this point, the endotracheal tube cuff was deflated and the  tube was retracted above the tracheostomy site.  A 7.5 anode tube was  placed through the tracheostomy site and the balloon was inflated.  The  anesthesia circuit was hooked up to the anode and the patient was  successfully ventilated.  The endotracheal tube was then removed.  The  single stomal suture was then tied down and tied to the anode tube.  The  tube was further secured.  At this point, the trachea was further dissected from the underlying  esophagus using a Kelly clamp.  The trachea was then divided around the  posterior wall extending the incision somewhat superiorly around the  posterior wall.  The larynx was then retracted outward as the trachea  and esophagus were further dissected from each other in a superior  direction.  At this point, the suprahyoid musculature was then divided  using Bovie electrocautery through the tongue base.  The lateral extent  of each hyoid bone was freed and vessels were ligated.  Dissection was  continued down the lateral thyroid lamina on both sides dividing the  constrictor muscles and elevating underneath the thyroid gland on both  sides.  The pharynx was then entered in the left vallecula just above  the epiglottis.  The incision was extended across the vallecula using  scissors and then extended inferiorly along the medial piriform sinus on  both sides.  The incision was extended across the post cricoid region  removing the larynx and neck dissection contents.  These were marked on   the back table  and placed in formalin after the larynx was opened  posteriorly and examined.  The margin appeared close at the vallecula,  so an additional margin of tongue base was then taken using Bovie  electrocautery.  Frozen sections of the remaining margin were sent and  ultimately returned negative for carcinoma.   Bleeding was controlled bipolar electrocautery.  A nasogastric tube was  then placed down the esophagus and placed to suction to confirm  position.  The tube was then secured to the anterior septum using a  through-and-through 2-0 nylon suture.  The pharynx was then closed using  a modified running canal suture using 3-0 Vicryl in a T-fashion tying  these sutures together at the trifurcation point.  The constrictor  muscles were then closed in a simple running fashion over the pharyngeal  closure with 3-0 Vicryl suture.  This was then secured to the tongue  base musculature in the same fashion after removing the shoulder roll.  At this point, the necks were copiously irrigated with saline and any  bleeding was controlled with ligation or bipolar electrocautery.  Several Valsalvas were given to the patient to look for bleeding.   After this, two large Blake drains were placed in either side of the  neck and coming out through separate stab incisions.  These were secured  loosely with 3-0 chromic suture to the underlying musculature.  The flap  was then laid back down and closed with 3-0 Vicryl suture in several  simple running segments in the platysma layer as well as the  subcutaneous layer in the left parotid region.  The skin was then closed  with staples in the neck and 5-0 nylon in simple running fashion in the  preauricular region.  The stoma was then further closed using 2-0 silk  suture in a simple interrupted fashion around the periphery of the stoma including to the superior neck flap.  Drains were hooked to suction  during closure.  At this point, with the  frozen section result  returning, the patient was returned anesthesia for wake-up.  The drapes  were removed and the patient was washed off.  Drains were attached to  the shoulders using tape.  The stoma anode tube was left in place for  mechanical ventilation.  The patient was then transported to the  intensive care unit in stable condition.  The patient had 600 mL of  blood loss and received 2 units of packed red blood cells during  surgery.      Antony Contras, MD  Electronically Signed    DDB/MEDQ  D:  10/26/2006  T:  10/27/2006  Job:  [id number]

## 2010-10-28 NOTE — H&P (Signed)
NAMEMOISES, TERPSTRA NO.:  [id number]   MEDICAL RECORD NO.:  [id number]          PATIENT TYPE:  INP   LOCATION:  2105                         FACILITY:  [facility]   PHYSICIAN:  Della Goo, M.D. DATE OF BIRTH:  11-15-1940   DATE OF ADMISSION:  10/14/2006  DATE OF DISCHARGE:                              HISTORY & PHYSICAL   ADDENDUM.   MEDICATIONS:  1. Metoprolol ER 50 mg one p.o. daily.  2. Allopurinol 300 mg one p.o. daily.  3. Plavix 75 mg one p.o. daily.  4. Aspirin  325 mg one p.o. daily.  5. Altace 10 mg one p.o. b.i.d.  6. Hydrochlorothiazide 25 mg one p.o. daily.  7. Amlodipine 10 mg one p.o. daily.  8. Lipitor 10 mg one p.o. daily.  9. NitroQuick 0.4 mg sublingual q. 5 minutes x3 for chest pain.      Della Goo, M.D.  Electronically Signed     HJ/MEDQ  D:  10/15/2006  T:  10/15/2006  Job:  [id number]

## 2010-10-28 NOTE — Consult Note (Signed)
[place]  Patient:    [name], [name]                          MRN: [id number] Proc. Date: 06/26/00 Adm. 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  CONSULTING DIAGNOSES:  Left knee and left elbow swelling.  HISTORY OF PRESENT ILLNESS:  The patient is a 70 year old black male who was admitted to the hospital for left-sided hemiparesis and was diagnosed with a cerebrovascular accident.  They then noted an acute swelling of the left knee, and left elbow.  He has no history of gout, no history of injury, and no history of any recent infections.  REVIEW OF SYSTEMS:  Otherwise negative other than the aforementioned.  He has other medical problems including the atherosclerosis, and cervical spine disease, and is on several medications.  PHYSICAL EXAMINATION:  GENERAL:  He well-nourished and well-developed. He has difficulty walking secondary to pain in his left knee.  He has full elbow motion.  The left elbow is neurovascularly intact.  The skin has 1+ swelling.  No erythema and slight warmth.  He really does not feel like he has more than a just mild effusion in his elbow.  The left knee has approximately 20 degrees range of motion from 20 to 40 degrees.  Ligaments are stable, strong, and neurovascularly intact and skin is normal.  He has a moderate to severe effusion in the left knee.  X-rays of the knee and the elbow were taken and show no arthritis or other abnormalities.  Lab work at this point is pending but previous lab work points towards no infection.  IMPRESSION:  Left knee and left elbow swelling of unknown etiology.  Leading diagnoses would be gout.  TREATMENT:  I did an aspiration of the left knee under sterile conditions and sent it off for stat gram stains, CNS, culture sensitivity, cell count  with differential and crystal analysis.  Dr. Sheppard Penton who is the consulting physician started him on empirically on colchicine.  We are also ordering a CBC with differential, CRP.  We will follow up on those laboratories and treat him appropriately. DD:  06/26/00 TD:  06/26/00 Job: [id number]

## 2010-10-28 NOTE — Consult Note (Signed)
NAME:  NAMISH, KRISE NO.:  [id number]   MEDICAL RECORD NO.:  [id number]          PATIENT TYPE:  INP   LOCATION:  1830                         FACILITY:  [facility]   PHYSICIAN:  Ulyses Amor, MD DATE OF BIRTH:  28-Feb-1941   DATE OF CONSULTATION:  10/14/2006  DATE OF DISCHARGE:                                 CONSULTATION   [name] is a 70 year old white man who is admitted to [facility] because of respiratory failure.   The patient presented to the emergency department with a several-day  history of increasing dyspnea.  He apparently has just finished a  prescription for Levaquin for an upper respiratory tract infection.  History is obtained through his daughter and she reports that he did not  complain of chest pain.  She has noted some increasing pedal edema in  recent days.  In the emergency department, he was intubated.   The patient has a history of coronary artery disease.  Approximately 2  months ago he suffered an acute myocardial infarction.  He then  underwent stenting of a coronary vessel.  These events occurred out of  town and hence are not reflected in his [facility] chart.  However, he has seen Dr. Katrinka Blazing since then and that information is likely  in Dr. Michaelle Copas office record.  The patient has no history of congestive  heart failure.  He has a number of risk factors for coronary artery  disease including hypertension, dyslipidemia, diabetes mellitus (recent  diagnosed), and a past history of smoking.  He does not smoke at this  time.  There is no family history of coronary artery disease.   PAST MEDICAL HISTORY:  Otherwise notable only for a past cerebrovascular  accident.  He also has gout.   MEDICATIONS:  1. Metoprolol.  2. Allopurinol.  3. Plavix.  4. Aspirin.  5. Altace.  6. Hydrochlorothiazide.  7. Amlodipine.  8. Lipitor.  9. Nitroglycerin.   ALLERGIES:  No known drug allergies.   PAST SURGICAL HISTORY:   None.   SOCIAL HISTORY:  The patient does not drink.  He does not smoke  cigarettes any longer.   FAMILY HISTORY:  Notable only for lung cancer in his mother.  There is  no family history of coronary artery disease.   REVIEW OF SYSTEMS:  Could not be obtained as the patient is intubated  and sedated.   PHYSICAL EXAMINATION:  VITAL SIGNS:  Blood pressure 99/64, pulse 110 and  regular, the patient is intubated and ventilated.  He is sedated and  therefore not responsive.  HEENT:  Head, eyes, and nose were normal.  NECK:  Without thyromegaly or adenopathy.  Carotid pulses were palpable  bilaterally and without bruits.  HEART:  Normal S1 and S2.  There was no S3, S4, murmur, rub, or click.  Cardiac rhythm is regular.  No chest wall tenderness was noted.  LUNGS:  Clear.  ABDOMEN:  Soft and nontender.  There was no mass, hepatosplenomegaly,  bruit, distention, rebound, guarding, or rigidity.  EXTREMITIES:  Without deviation or deformity.  Slight pedal edema was  noted.  Dorsalis pedal pulses were palpable bilaterally.  NEUROLOGY:  The patient was not responsive due to sedation.  There were  no focal neurologic findings on brief screening neurologic examination.   The chest radiograph, according to the radiologist, demonstrated an  endotracheal tube in the right mainstem bronchus, a nasogastric tube in  the stomach, and developing left lower lobe atelectasis.   The electrocardiogram revealed normal sinus rhythm with left bundle  branch block.  The initial set of cardiac markers revealed a myoglobin  of 230, CK-MB 8.8, and troponin less than 0.05.  The second set of  cardiac markers revealed a myoglobin of 328, CK-MB 7.9, and troponin  less than 0.05.  White count was 16.5 with a hemoglobin of 14.8, and  hematocrit of 45.1.  Potassium was 3.4, BUN 11, and creatinine 0.8.  Fibrin derivatives were less than 0.22.   IMPRESSION:  1. Respiratory failure.  The etiology is not clear at this  point.      Rule out myocardial infarction, rule out congestive heart failure,      rule out bronchospasm, rule out pneumonia, rule out airway      obstruction.  2. Coronary artery disease, 2 months status post myocardial infarction      and subsequent stenting (out of town records not available at this      time).  3. Hypertension.  4. Dyslipidemia.  5. Status post cerebrovascular accident.  6. Gout.   PLAN:  1. Coronary care unit.  2. Ventilator support.  3. Inputs and outputs, weights.  4. Echocardiogram.  5. Serial cardiac enzymes.  6. Repeat BNP.  7. Intravenous heparin or subcutaneous Lovenox.  8. Intravenously nitroglycerin if blood pressure can tolerate.  9. Obtain records from Dr. Michaelle Copas office.  10.Diuresis as needed.  11.Critical care medicine assistance.  12.Dr. Katrinka Blazing will follow with you beginning in the morning.      Ulyses Amor, MD  Electronically Signed     MSC/MEDQ  D:  10/14/2006  T:  10/14/2006  Job:  [id number]   cc:   Lyn Records, M.D.

## 2010-10-28 NOTE — Consult Note (Signed)
[place]  Patient:    [name], [name]                          MRN: [id number] Proc. Date: 06/27/00 Adm. Date:  [id number] Attending:  Miguel Aschoff CC:         Dr. Gayleen Orem, M.D.   Consultation Report  HISTORY OF PRESENT ILLNESS:  Delwin Raczkowski is a 70 year old, right-handed, black gentleman born 08/16/1940, with a history of severe hypertension.  This patient was followed by Dr. Merla Riches, [facility], and has been on Norvasc for this.  This patient woke up with onset of some problems with left-sided weakness on the day of admission.  The patient initially noted that he was able to walk but as time went, he was dragging his leg more.  The patient eventually lost the ability to ambulate and claims the deficit progressed over a day or two even while in the hospital on heparin.  The patient was initially noted to have severe hypertension with diastolics in the 140 range.  The patient has not experienced any headache, slurred speech, or swallowing problems, and denies significant numbness on the left side.  This patient has developed swelling of the left knee thought secondary to gout or pseudogout and neurology was asked to see this patient at this time for further evaluation.  An MRI scan of the brain has shown evidence of a right posterior limb internal capsular stroke extending into the centrum semiovale and thalamus.  MR angiogram of his cranial vessels show evidence of diffuse atherosclerotic changes.  No particular area of severe stenosis.  A Doppler study of the carotid vessels was unremarkable.  Physical and occupational therapy has seen him.  The patient is felt to be a good rehab candidate.  PAST MEDICAL HISTORY:  1. History of severe hypertension.  2. New onset right brain stroke, left hemiparesis.  3. Gout involving the left knee.  The patient was not on aspirin prior to     admission.  SOCIAL  HISTORY:  Smokes 1/2 pack of cigarettes a day.  Drinks alcohol on occasion in the form of wine.  The patient is married.  Lives in the [place] area.  Was inbetween jobs at [employer].  ALLERGIES:  No known allergies.  FAMILY MEDICAL HISTORY:  Both parents are still alive.  Mother is 48.  Father around 11-86.  Mother has history of hypertension.  The patients one brother died with complications of alcoholism.  One sister has history of congestive heart failure.  The patient has two children who are alive and well.  REVIEW OF SYSTEMS:  Noted for no fever or chills.  The patient denies headaches, swallowing problems, shortness of breath, chest pains, nausea, vomiting.  The patient has had some difficulty controlling the bladder since his admission to this hospital.  The patient has severe weakness of the left side.  No numbness.  PHYSICAL EXAMINATION:  VITAL SIGNS:  Blood pressure 154/82, heart rate 88, respiratory rate 20, temperature afebrile.  GENERAL:  This patient is a minimally obese black gentleman who is alert and cooperative at time of examination.  HEENT:  Head is atraumatic.  Eyes:  Pupils are 1-2 mm, trace reactive to light.  Disks soft, flat.  Extraocular movements are full.  Tongue midline.  NECK:  Supple.  No carotid bruits noted.  RESPIRATORY:  Clear to auscultation and percussion.  CARDIOVASCULAR:  Grade  2/6 systolic ejection murmur at the left lower sternal border.  EXTREMITIES:  Swelling involving the left knee, not present on the right.  NEUROLOGIC:  Cranial nerves as above.  Facial symmetry is relatively present. The patient notes some minimal decrease in pinprick on the left lower face compared to the right, otherwise symmetric in the forehead.  The patient has good symmetric smile.  Speech is well enunciated, not aphasic.  Motor testing reveals essentially plegic left arm and left leg.  The patient has elevated reflexes in the left arm as  compared to the right, more symmetric in the legs. Upgoing toe noted on the left leg.  The patient has good symmetry to pinprick, soft touch, vibratory sensation throughout.  The patient is unable to perform cerebellar testing on the left.  Can perform finger-to-nose-to-finger and toe-to-finger well on the right.  The patient could not be ambulated.  LABORATORY:  Sodium 137, potassium 3.8, chloride 103, CO2 27, glucose 110, b.. 9, creatinine 0.8, calcium 8.9, total protein 6.4, albumin 3.2, AST 15, ALT 14, alkaline phosphatase 50, total bilirubin 0.5.  Uric acid 5.6.  White count 15.4, hemoglobin 13.2, hematocrit 38.9, MCV 89.4, platelets 240.  MRI scan shows acute infarct involving the posterior limb of internal capsule, posterior septum, semiovale, and posterior thalamus.  Mild atrophy seen. Moderate changes consistent with small vessel ischemic changes and mild intracranial atherosclerotic changes noted without proximal stenosis.  CT of the brain initially showed no acute abnormalities.  EKG reveals normal sinus rhythm, nonspecific T wave abnormalities.  Heart rate of 82.  IMPRESSION:  1. New onset of right brain stroke, possibly a large lacune, but area of     stroke is a bit larger than one would expect.  2. Severe hypertension.  This patient has ___________ motor deficit on the left side and should do well with rehab.  The patient has had adequate work-up at this point.  I see no indication at this time for Coumadin therapy.  The stuttering course of the stroke is not consistent with an embolic stroke event.  This patient does have a cardiac murmur, however, and this may need to be investigated at some point in the future.  The patient was not on aspirin on admission.  PLAN:  1. Recommend aspirin therapy at this time.  2. Would have patient continue with physical and occupational therapy and     eventually rehab transfer.  We will follow patient clinically.  DD:   06/27/00 TD:  06/27/00 Job: [id number]

## 2010-10-28 NOTE — Discharge Summary (Signed)
NAME:  [name], [name] NO.:  [id number]   MEDICAL RECORD NO.:  [id number]          PATIENT TYPE:  INP   LOCATION:  2105                         FACILITY:  [facility]   PHYSICIAN:  Gailen Shelter, MD  DATE OF BIRTH:  September 06, 1940   DATE OF ADMISSION:  10/14/2006  DATE OF DISCHARGE:                               DISCHARGE SUMMARY   INTERIM DISCHARGE SUMMARY.   DATE OF TRANSFER OF SERVICE:  Oct 29, 2006.   DISCHARGE DIAGNOSES AS FOLLOWS:  1. 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  2. 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  3. Status post ventilator-dependent respiratory failure secondary to      number 1.  4. History of coronary artery disease and aortic stenosis.  5. Hospital-acquired pneumonia (no organism specified).   BRIEF HISTORY:  Patient, [name], is a 70 year old male patient whom  the pulmonary critical care service was asked to see by the [facility]  internal medicine service for acute respiratory failure and ventilator  management.  Patient was intubated on Oct 14, 2006, for acute respiratory  failure and what was initially felt to be asymmetric pulmonary edema.  He was intubated by Anesthesia, who raised the question about mass in  the oral cavity.  A CT of neck demonstrated a parotid mass with  significant lymphadenopathy.  The pulmonary critical care service was  asked to evaluate and assume care.   HOSPITAL COURSE BY DISCHARGE DIAGNOSIS:  1. Status post ventilator-dependent respiratory failure in the setting      of upper airway obstructions secondary to squamous cell cancer.      Now status post total laryngectomy of neck, with bilateral      parotidectomy on Oct 27, 2006.  [name] was transferred from the      medical service to the critical care  service secondary to      ventilator-dependent respiratory failure.  He remained ventilator      dependent from Oct 14, 2006 to Oct 28, 2006, awaiting surgery for      laryngectomy.  Status post laryngectomy he was successfully weaned      from mechanical ventilation and he is now tolerating trach collar      without difficulty.  2. Pneumonia (ventilator-associated), no organism specified.  Patient      did develop onset of pulmonary infiltrates, fever, and leukocytosis      with purulent sputum.  Culture data was obtained but was      nondiagnostic.  He is currently on day #4 of 7 empiric ceftazidime.      Chest x-ray today demonstrates no infiltrate.  3. History of coronary artery disease with aortic stenosis.  Currently      on heparin and aspirin.  Cardiology recommends restarting Plavix as      soon as okayed by GI.  4. Disposition.  From an oncology standpoint, Radiology  Oncology was      consulted by ENT.  The patient will be discharged from the critical      care service and transferred to the step down setting.   PERTINENT LABORATORY DATA UPON TIME OF DISCHARGE:  Hemoglobin 9.3,  hematocrit 27.9, white blood cell count 12.7, glucose 158, calcium 8,  platelets 244, sodium 135, potassium 3.6, chloride 96, bicarbonate 31,  BUN 7, creatinine 0.55.   MEDICATIONS UPON TIME OF DISCHARGE AS FOLLOWS:  1. Aspirin 325 mg via tube daily.  2. Atrovent 0.5 mg neb q.6h.  3. Colace 100 mg via tube daily.  4. Fortaz 1 g IV q.8h.  5. Heparin drip, adjusted by pharmacy.  6. Lopressor 50 mg via tube b.i.d.  7. Sliding scale insulin.  8. Osmolite tube feedings, currently infusing via feeding tube.  9. Pepcid 20 mg via tube daily.  10.Senokot one tab daily q.h.s.  11.Ventolin 2.5 mg HHN q.6h.  12.Zocor 20 mg p.o. every 6 p.m.  13.Zyloprim 300 mg tab daily.  14.Bacitracin ointment to trach site.  15.Dulcolax suppository 10 mg per rectum p.r.n.  16.Lortab 7.5/500, he can take 10-20 mL q.4h.  p.r.n. pain.   DISPOSITION:  [name] critical illness is currently resolved.  He  will be managed from a surgical standpoint from Dr. Christia Reading.  The  remainder of his internal medicine management will be assumed by the  [facility] service.      [name], NP      C. Danice Goltz, MD  Electronically Signed    PB/MEDQ  D:  10/29/2006  T:  10/29/2006  Job:  [id number]

## 2010-10-28 NOTE — Discharge Summary (Signed)
NAME:  [name], [name]                 ACCOUNT NO.:  [id number]   MEDICAL RECORD NO.:  [id number]          PATIENT TYPE:  INP   LOCATION:  3705                         FACILITY:  [facility]   PHYSICIAN:  Mobolaji B. Bakare, M.D.DATE OF BIRTH:  Jul 29, 1940   DATE OF ADMISSION:  09/22/2004  DATE OF DISCHARGE:  09/24/2004                                 DISCHARGE SUMMARY   PRIMARY CARE PHYSICIAN:  Dr. Barbee Shropshire.   FINAL DIAGNOSES:  1.  Fractured ribs 6, 7, 8 and 9 on the left secondary to fall.   SECONDARY DIAGNOSES:  1.  Hypertension, controlled.  2.  Hyperlipidemia.  3.  History of stroke with left hemiparesis.   PROCEDURE:  CXR - Fracture involving 6th  to 9th ribs on the left without pneumothorax   CHIEF COMPLAINT:  Left-sided chest pain following a fall and shortness of  breath.  [name] is a pleasant 70 year old African-American male with  history of CVA and left-sided hemiparesis December 2002.  He stated that he  accidentally fell while walking, his left foot was caught on a rock.  He  denied any loss of consciousness when he fell.  However he did experience  pain on the left side immediately.  On arrival in the emergency department a  chest x-ray confirmed fracture involving 6th to 9th ribs without any  pneumothorax.  The patient was admitted for pain management.   PERTINENT PHYSICAL FINDINGS:  Initial vitals temperature 97.9, blood  pressure 128/77, pulse 94, respiratory rate 16, O2 saturation 97% on 2  liters.  GENERAL:  Moderately obese, not in obvious respiratory distress.  Pertinent  findings on examination was in the respiratory system.  He had a few  expiratory wheezes throughout the lung fields and frank crackles at the left  lung, but there was no reduced air entry.  CARDIOVASCULAR:  S1, S2 with a grade 2/6 systolic murmur.  ABDOMEN:  Obese and nontender.  CNS:  He has residual left hemiparesis.   LABORATORY FINDINGS:  White count 12.4, hemoglobin 12.9, hematocrit  38.1,  platelet count 316,000, chest x-ray as described in the HPI.  EKG showed  normal sinus rhythm with nonspecific ST abnormalities.   HOSPITAL COURSE:  [name] was admitted for pain control and observation.  He was started on oxycodone p.r.n. and morphine.  This helped his pain  tremendously.  He did well and was discharged home on p.o. Percocet p.r.n.  He was able to ambulate in the hallway without any difficulty.  He was seen  by physical therapy, the patient was independent in activities prior to  admission.  He was discharged home in a stable condition on the following  medications:  1.  Percocet 5/325 1-2 every 4-6 hours p.r.n. for pain.  2.  Calcium plus vitamin D 600 mg two times a day.  3.  Lipitor 10 mg once a day.  4.  Norvasc 10 mg once a day.  5.  Altace 10 mg two times a day.  6.  Hydrochlorothiazide 25 mg once a day.  7.  Allopurinol 300 mg once a day.  8.  Aspirin 325 mg once a day.  9.  Claritin 10 mg once a day.  10. The patient was given incentive spirometer to use p.r.n. at home.   Follow up with Dr. Barbee Shropshire in 1-2 weeks.      MBB/MEDQ  D:  10/06/2004  T:  10/06/2004  Job:  [id number]   cc:   Olene Craven, M.D.  [address]  [address]  [place]  [place] [address]  Fax: [phone]

## 2010-10-28 NOTE — Op Note (Signed)
[name], ASHURST NO.:  [id number]   MEDICAL RECORD NO.:  [id number]          PATIENT TYPE:  INP   LOCATION:  2105                         FACILITY:  [facility]   PHYSICIAN:  Antony Contras, MD     DATE OF BIRTH:  1941/03/24   DATE OF PROCEDURE:  10/17/2006  DATE OF DISCHARGE:                               OPERATIVE REPORT   PREOPERATIVE DIAGNOSIS:  1. Upper airway obstruction.  2. Acute respiratory failure.   POSTOPERATIVE DIAGNOSIS:  1. Upper airway obstruction.  2. Acute respiratory failure.  3. Supraglottic mass.   PROCEDURE:  .1  Direct laryngoscopy with biopsy.  1. Rigid esophagoscopy.  2. Flexible bronchoscopy.   SURGEON:  Christia Reading, M.D.   ANESTHESIA:  General endotracheal anesthesia.   COMPLICATIONS:  None.   INDICATIONS:  The patient is a 70 year old African American male who has  had a several month history of progressive shortness of breath and  decreased appetite.  He also has had a bloody cough.  He presented to  the emergency department by ambulance on Oct 14, 2006, and has required  elective intubation by anesthesia.  During intubation, a mass was noted  in the supraglottic larynx.  He was successfully intubated and has been  on the ventilator for airway control.  He presents to the operating room  today for panendoscopy and biopsies.   FINDINGS:  Upon laryngoscopy, the patient was found have an ulcerative  mass involving the entire superior portion of the laryngeal surface of  the epiglottis on both the right and left sides.  The mass extends down  the central epiglottis to just above the anterior commissure of the  vocal cords.  There is also a separate small irregular mass of the  middle left vocal cord.  The subglottis appears to be free of tumor.  The tumor extends through the epiglottis on the right side superiorly to  come out the lingual surface.  Biopsies were performed of all these  locations.  Frozen section biopsies from  the epiglottis demonstrated  invasive squamous cell carcinoma.  There was no other abnormalities seen  upon flexible bronchoscopy or esophagoscopy.  Palpation of the tongue  base reveals a soft tongue base near the lingual epiglottis mass.   DESCRIPTION OF PROCEDURE:  The patient was identified in the holding  room and, informed consent having been obtained, the patient was moved  to the operating suite and placed on the table in a supine position.  Anesthesia was induced.  The patient was maintained via endotracheal  anesthesia.  The bed was turned 90 degrees from anesthesia and the eyes  were taped closed.  A tooth guard was placed on the upper teeth.  An  anterior commissure laryngoscope was then inserted and used to view the  various locations of the pharynx and larynx including the vallecula,  endolarynx, piriform sinuses, and hypopharynx..  After examining the  larynx, the laryngoscope was switched to a Jako laryngoscope and was  then used to expose the involved areas of the larynx where biopsies were  performed using upbiting cup  forceps.  This was done after performing  photo documentation of the lesion.  Frozen sections were performed from  the epiglottis, as well.  These returned as invasive squamous cell  carcinoma.  During photo documentation occupation and biopsy  performance, the laryngoscope was placed in suspension using a Lewy arm  on a Mayo stand for portions of the procedure.   After this portion was completed, the laryngoscope was taken out of  suspension and removed from the patient's mouth.  A rigid esophagoscope  was inserted into the mouth and was then carefully passed down the lumen  of the esophagus keeping the lumen in view and following the nasogastric  tube.  It was then backed slowly out examining the esophagus for 360  degrees on the way out.  The nasogastric tube did obstruct the view  somewhat as did blood from the larynx superiorly.  After this, a   flexible bronchoscope was passed through the endotracheal tube and down  the trachea and primary bronchi to view the secondary bronchi.  This was  then removed.   Finally, the larynx was re-exposed using the anterior commissure  laryngoscope and the endotracheal tube cuff was deflated.  A 0 degrees  telescope was passed around the cuff anteriorly and then posteriorly to  view the upper trachea.  At this point, the patient returned to  anesthesia for wake up and was moved to the intensive care unit still  intubated.   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  Motion of the vocal cords was not able to be assessed due to the  patient being intubated.  There does not appear to be an extension into  the subglottis or into the tongue base.  The patient has been on Plavix  as recently as three days ago and was changed to heparin which was held  for surgery.  Because of Plavix taking 7-10 days to run out of the  system, more extensive surgery was not performed today.  His airway is  being adequately controlled with endotracheal intubation and this should  be kept in place.  A discussion of treatment options will be performed  with the patient and his wife and these include tracheostomy with  subsequent radiation and chemotherapy treatment for the tumor and a  total laryngectomy with neck dissections.  A partial laryngectomy would  not be an option if the left vocal cord has invasive cancer.  The  patient also has a left parotid gland mass that has been present for  some time that is probably unrelated to the supraglottic tumor but  resection would be performed at the same time as neck dissections in the  event that this may represent a metastasis, as well.      Antony Contras, MD  Electronically Signed    DDB/MEDQ  D:  10/17/2006  T:  10/17/2006  Job:  [id number]

## 2010-10-28 NOTE — H&P (Signed)
NAMEYAKOV, BERGEN NO.:  [id number]   MEDICAL RECORD NO.:  [id number]          PATIENT TYPE:  INP   LOCATION:  1829                         FACILITY:  [facility]   PHYSICIAN:  Elliot Cousin, M.D.    DATE OF BIRTH:  April 19, 1941   DATE OF ADMISSION:  09/22/2004  DATE OF DISCHARGE:                                HISTORY & PHYSICAL   PRIMARY CARE PHYSICIAN:  Dr. Kern Reap.   CHIEF COMPLAINT:  Left-sided chest pain secondary to a fall, shortness of  breath.   HISTORY OF PRESENT ILLNESS:  Mr. 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 The patient states  that at approximately 2 a.m. this morning, he got up to go to the bathroom.  On his way back, his left foot got caught on a rug or carpet on the floor  and he fell on his left side onto the corner of the bedside table. The  patient felt immediate sharp left-sided chest pain which was ranked 10/10.  He is hemiparetic on his left side and he was unable to brace his fall at  all. He has very little movement of his left arm; however,  his left leg is  alittle stronger. The patient denies head trauma or loss of consciousness  before and after the fall. The patient became short of breath with wheezing  and pleurisy. The patient has no other complaints at this time other than  pain and shortness of breath.   When the patient was initially seen in the emergency department he was  hemodynamically stable. A chest x-ray with left rib x-rays revealed an acute  fracture on the left of ribs #6, #7, #8, and #9. The chest x-ray also  revealed a small amount of pleural thickening/fluid. No visible  pneumothorax. The patient will be admitted for further evaluation and  management.   PAST MEDICAL HISTORY:  1.  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  2.  Hyperlipidemia.  3.  Hypertension.  4.  Gout.  5.  Seasonal allergies.  6.  Tobacco abuse.  7.  Obesity.  8.  History of C6 neuropathy.  9.  Degenerative joint disease.   MEDICATIONS:  .  1.  Norvasc 10 mg daily.  2.  Hydrochlorothiazide 12.5 mg daily.  3.  Altace 10 mg b.i.d.  4.  Lipitor 10 mg q.h.s.  5.  Allopurinol 300 mg daily.  6.  Aspirin 325 mg daily.   SOCIAL HISTORY:  The patient is married and lives [place] his [place]. His wife is a Designer, jewellery at [employer]. They have one child. The patient is retired from [employer].  He has smoked on and off for the past 20 years. Currently, he smokes five to  six cigarettes per day and stated that he was trying to stop. He drinks one  to two glasses  of red wine each night. The patient usually ambulates without  the aid of a cane or walker. He can read and write.   FAMILY HISTORY:  His mother died of lung cancer at 36 years of age. His  father is living. He is in his 71s and has hypertension.   REVIEW OF SYSTEMS:  Positive for seasonal allergy symptoms with nasal  congestion, runny eyes, and runny nose; weakness on his left side.  Otherwise, his review of systems is negative.   PHYSICAL EXAMINATION:  VITAL SIGNS:  Temperature 97.9, blood pressure  128/77, pulse 91, respiratory rate 16, oxygen saturation 97% on 2 L.  GENERAL:  The patient is a pleasant, obese, 70 year old African-American man  who is currently lying in bed in mild distress secondary to left-sided chest  pain.  HEENT:  Head is normocephalic, nontraumatic. Pupils equally round and  reactive to light. Extraocular movements are intact. Conjunctivae are clear,  sclerae are white. Tympanic membranes are clear bilaterally. Nasal mucosa is  moist with no sinus tenderness, no active drainage. Oropharynx reveals  multiple missing teeth, fair to poor dentition. Mucous  membranes are dry. No  posterior exudates or erythema.  NECK:  Supple and obese. No thyromegaly, no bruit, no JVD.  LUNGS/RESPIRATORY:  The patient is mildly dyspneic. He is not tachypneic. He  does have a few expiratory wheezes throughout the lung fields and a few fine  crackles of the left lung. He does have evidence of pleurisy. The patient is  tender over the left chest wall and there is some bruising on the left  lateral chest wall.  HEART:  S1, S2, with a 2/6 systolic murmur.  ABDOMEN:  Obese, positive bowel sounds, soft, nontender, nondistended, no  hepatosplenomegaly. He does not have abdominal pain on the left. The spleen  was not palpable.  EXTREMITIES:  The patient has a flexion contracture of his left upper  extremity with contracture-like changes of his fingers on the left. He is  able to lift his left leg a little against gravity although he states that  it hurts his left chest to try to lift up his leg. The patient was also able  to lift up his right leg against gravity although there was some discomfort  secondary to left chest wall pain. Pedal pulses barely palpable bilaterally,  although his feet were warm. He does have a trace of ankle edema  bilaterally. No pretibial edema.  NEUROLOGIC:  The patient is alert and oriented x3. Cranial nerves II-XII are  intact. Strength of the left upper extremity is 1/5 and the strength of the  left lower extremity is approximately 3+/5. Strength on the right upper and  right lower extremity is 5/5. Plantar reflexes upgoing on the left.  Sensation is intact on the right, diminished on the left.   ADMISSION LABORATORY:  Pending. EKG revealed normal sinus rhythm with  nonspecific ST and T wave abnormalities. Nonspecific intraventricular block.  Ventricular rate 90 beats per minute. Chest x-ray with left rib film revealed acute fracture on the left of ribs #6, #7, #8, and #9. Small amount  of pleural thickening/fluid. No visible  pneumothorax.   ASSESSMENT:  1.  Shortness of breath with bronchospasms secondary to acute left rib      fractures. The patient will need to be monitored at least 24-48 hours.      Certainly, he is at risk for a pneumothorax and a hemopneumothorax. The      patient appears to be  hemodynamically stable now; however, given his      general debilitation, he warrants admission. There is a small amount of      pleural thickening and fluid on chest x-ray but no pneumothorax      radiographically.  2.  History of stroke with left-sided hemiparesis.  3.  Hypertension.  4.  Hyperlipidemia.   PLAN:  1.  The patient will be admitted for further evaluation and monitoring.  2.  Will assess STAT CBC, CMET, PT, and PTT.  3.  Will follow the patient's hemoglobin, hematocrit, and vital signs. Will      closely monitor for respiratory and circulatory decompensation.  4.  Will repeat chest x-ray and left rib x-rays in the morning. Consider CT      scan of the abdomen and chest if needed.  5.  Will start nebulizer treatments with Xopenex.  6.  Will encourage incentive spirometry.  7.  Pain management with morphine and Tylenol.  8.  Continue antihypertensive medications.  9.  Will hold on aspirin and anticoagulation for now given risk of      hemopneumothorax.      DF/MEDQ  D:  09/22/2004  T:  09/22/2004  Job:  [id number]   cc:   Olene Craven, M.D.  [address]  [address]  [place]  [place] [address]  Fax: [phone]

## 2010-10-28 NOTE — H&P (Signed)
NAME:  [name], [name] NO.:  [id number]   MEDICAL RECORD NO.:  [id number]          PATIENT TYPE:  INP   LOCATION:  2105                         FACILITY:  [facility]   PHYSICIAN:  Della Goo, M.D. DATE OF BIRTH:  [birth year]   DATE OF ADMISSION:  10/14/2006  DATE OF DISCHARGE:                              HISTORY & PHYSICAL   PRIMARY CARE PHYSICIAN:  Olene Craven, M.D.   CHIEF COMPLAINT:  Shortness of breath, respiratory distress.   HISTORY OF PRESENT ILLNESS:  This is a 71 year old male who presented to  the emergency department secondary to worsening shortness of breath in  respiratory distress.  He was evaluated in the emergency department by  the emergency room physician and had to be placed on BiPAP due to his  labored breathing and results of his blood gas revealed a mild increase  in his pCO2 level.  The patient was unable to give history, so history  is per his wife who reports the patient had been short of breath for  about 3 weeks and had some of upper respiratory symptoms.  He was  evaluated by his primary care physician and had outpatient therapy of a  Z-Pak and Levaquin.  His symptoms, however, continued to worsen and the  patient had to be transported to the emergency department this evening.  She reports that he has not had any fevers, chills and that he has not  complained of having chest pain.  The patient has had a recent cardiac  cath and stent placement in a single vessel, the circumflex, which was  performed when he had chest pain and was hospitalized in [place],  [place] in March 2008.  The patient returned on medical therapy  of Plavix and aspirin for 6 months for his stent placement.  The  patient's wife also reports the patient has had ankle and lower  extremity edema.   PAST MEDICAL HISTORY:  1. The patient has had a cerebrovascular accident in 2002 and he still      suffers with mild left-sided hemiparesis.  2.  Hypertension.  3. Borderline type 2 diabetes mellitus.  4. Hyperlipidemia.  5. Coronary artery disease, status post PTCA with stent placement of      the circumflex.  6. The patient was last hospitalized at [facility] in 2006      when he had rib fractures following a fall.   MEDICATIONS:  Metoprolol, allopurinol, Plavix, aspirin, Altace,  hydrochlorothiazide, amlodipine, Lipitor, and sublingual nitroglycerin.  An addendum will be placed with the dosages of the medications.   ALLERGIES:  No known drug allergies.   SOCIAL HISTORY:  The patient is married.  Disabled since his stroke in  2002.  Nonsmoker, nondrinker.   FAMILY HISTORY:  Positive for coronary artery disease.  Sister died of  CHF complications.  No history of diabetes mellitus in the family.  Mother died of lung cancer.   REVIEW OF SYSTEMS:  Pertinent for as mentioned above.   PHYSICAL EXAMINATION:  GENERAL:  This is a 70 year old, obese male in  acute  distress.  VITAL SIGNS:  Initially were a temperature of 99, blood pressure 124/94,  heart rate 108, respirations 35-44, oxygen saturation  100%.  HEENT:  Normocephalic, atraumatic.  Pupils equal, round, react to light.  Extraocular muscles are intact.  Funduscopic benign.  Oropharynx clear.  NECK:  Positive enlargement in the anterior cervical nodes.  No  thyromegaly, adenopathy, or jugular venous distension.  CARDIOVASCULAR:  Regular rate and rhythm.  LUNGS:  Clear to auscultation.  No rales, rhonchi or wheezes are  appreciated.  ABDOMEN:  Obese.  Tympanic in all four quadrants.  Nontender.  No  hepatosplenomegaly.  EXTREMITIES:  Revealing 1+ edema in the pretibial areas.  NEUROLOGIC:  The patient is alert, currently on BiPAP and unable to  speak in full sentences secondary to respiratory distress.   LABORATORY STUDIES:  White blood cell count 16.5, hemoglobin 14.8,  hematocrit 45.1, platelets 348,000, neutrophils 71%, lymphocytes 22%.  Sodium 124,  potassium 3.4, chloride 91, BUN 11, creatinine 0.8, glucose  153.  Urinalysis negative.  PT 41, INR 1.1.  Arterial blood gas pH 7.34,  pCO2 62.1, bicarb 34, oxygen saturation  97%, pO2 was 102..  Chest x-ray  findings revealed bilateral atelectasis and possible mild asymmetric  CHF.  Cardiac enzymes performed revealed a myoglobin level of 230, CK-MB  8.8, troponin less than 0.05.   ASSESSMENT:  A 70 year old male being admitted with respiratory  distress.  Other diagnoses include:  1. Early pneumonia versus mild acute congestive heart failure.  2. Hypertension.  3. Coronary artery disease.  4. Hyperglycemia.  5. Cerebrovascular accident history.  6. Hyperlipidemia.  7. Upper airway area nodular mass.   PLAN:  The patient will be admitted to the intensive care unit area.  He  continued to be in respiratory distress and required intubation which  was performed by anesthesia.  The patient had some difficulty with  intubation secondary to enlargement in the lateral areas of the  posterior pharynx.  The patient was sent for a CT scan of the neck after  he was intubated and sedated on medications.  This patient has been  treated with IV Lasix therapy.  A BNP was also ordered along with a D-  dimer study.  A feeding tube was also placed.  He remains NPO overnight.  He will remain sedated for comfort at this time.  Critical care medicine  was consulted and also cardiology was consulted to see patient secondary  to his cardiac history.  He has been placed on GI prophylaxis of IV  Protonix and DVT prophylaxis has been ordered as well.      Della Goo, M.D.  Electronically Signed     HJ/MEDQ  D:  10/15/2006  T:  10/15/2006  Job:  [id number]   cc:   Olene Craven, M.D.

## 2011-01-02 ENCOUNTER — Other Ambulatory Visit: Payer: Self-pay | Admitting: Internal Medicine

## 2011-01-02 ENCOUNTER — Ambulatory Visit (HOSPITAL_COMMUNITY)
Admission: RE | Admit: 2011-01-02 | Discharge: 2011-01-02 | Disposition: A | Discharge status: Home / Self Care | Payer: Medicare Other | Source: Ambulatory Visit | Attending: Internal Medicine | Admitting: Internal Medicine

## 2011-01-02 ENCOUNTER — Other Ambulatory Visit (HOSPITAL_COMMUNITY): Payer: Self-pay

## 2011-01-02 ENCOUNTER — Encounter (HOSPITAL_BASED_OUTPATIENT_CLINIC_OR_DEPARTMENT_OTHER): Payer: Medicare Other | Admitting: Internal Medicine

## 2011-01-02 DIAGNOSIS — Z8521 Personal history of malignant neoplasm of larynx: Secondary | ICD-10-CM | POA: Insufficient documentation

## 2011-01-02 DIAGNOSIS — K7689 Other specified diseases of liver: Secondary | ICD-10-CM | POA: Insufficient documentation

## 2011-01-02 DIAGNOSIS — J984 Other disorders of lung: Secondary | ICD-10-CM | POA: Insufficient documentation

## 2011-01-02 DIAGNOSIS — J438 Other emphysema: Secondary | ICD-10-CM | POA: Insufficient documentation

## 2011-01-02 DIAGNOSIS — Q619 Cystic kidney disease, unspecified: Secondary | ICD-10-CM | POA: Insufficient documentation

## 2011-01-02 DIAGNOSIS — C3410 Malignant neoplasm of upper lobe, unspecified bronchus or lung: Secondary | ICD-10-CM

## 2011-01-02 DIAGNOSIS — Z930 Tracheostomy status: Secondary | ICD-10-CM | POA: Insufficient documentation

## 2011-01-02 DIAGNOSIS — K5730 Diverticulosis of large intestine without perforation or abscess without bleeding: Secondary | ICD-10-CM | POA: Insufficient documentation

## 2011-01-02 DIAGNOSIS — C3490 Malignant neoplasm of unspecified part of unspecified bronchus or lung: Secondary | ICD-10-CM | POA: Insufficient documentation

## 2011-01-02 DIAGNOSIS — I2510 Atherosclerotic heart disease of native coronary artery without angina pectoris: Secondary | ICD-10-CM | POA: Insufficient documentation

## 2011-01-02 DIAGNOSIS — K8020 Calculus of gallbladder without cholecystitis without obstruction: Secondary | ICD-10-CM | POA: Insufficient documentation

## 2011-01-02 LAB — CBC WITH DIFFERENTIAL/PLATELET
BASO%: 0.5 % (ref 0.0–2.0)
Eosinophils Absolute: 0.1 10*3/uL (ref 0.0–0.5)
MCHC: 33 g/dL (ref 32.0–36.0)
MONO#: 0.4 10*3/uL (ref 0.1–0.9)
MONO%: 5.8 % (ref 0.0–14.0)
NEUT#: 4.7 10*3/uL (ref 1.5–6.5)
RBC: 5.14 10*6/uL (ref 4.20–5.82)
RDW: 15.4 % — ABNORMAL HIGH (ref 11.0–14.6)
WBC: 6 10*3/uL (ref 4.0–10.3)

## 2011-01-02 LAB — CMP (CANCER CENTER ONLY)
AST: 28 U/L (ref 11–38)
BUN, Bld: 11 mg/dL (ref 7–22)
Calcium: 8.8 mg/dL (ref 8.0–10.3)
Chloride: 98 mEq/L (ref 98–108)
Creat: 1 mg/dl (ref 0.6–1.2)
Glucose, Bld: 90 mg/dL (ref 73–118)

## 2011-01-02 MED ORDER — IOHEXOL 300 MG/ML  SOLN
125.0000 mL | Freq: Once | INTRAMUSCULAR | Status: AC | PRN
  Administered 2011-01-02: 125 mL via INTRAVENOUS

## 2011-01-04 ENCOUNTER — Encounter (HOSPITAL_BASED_OUTPATIENT_CLINIC_OR_DEPARTMENT_OTHER): Payer: Medicare Other | Admitting: Internal Medicine

## 2011-01-04 DIAGNOSIS — C3410 Malignant neoplasm of upper lobe, unspecified bronchus or lung: Secondary | ICD-10-CM

## 2011-01-04 DIAGNOSIS — C321 Malignant neoplasm of supraglottis: Secondary | ICD-10-CM

## 2011-03-03 LAB — CBC
Hemoglobin: 13
RBC: 4.65
WBC: 7.6

## 2011-03-03 LAB — BASIC METABOLIC PANEL
CO2: 28
Calcium: 9.2
GFR calc Af Amer: >60
GFR calc non Af Amer: >60
Sodium: 140

## 2011-03-21 LAB — DIFFERENTIAL
Basophils Absolute: 0
Eosinophils Absolute: 0
Eosinophils Relative: 0
Lymphocytes Relative: 19
Lymphs Abs: 0.7
Monocytes Absolute: 0.1 — ABNORMAL LOW
Monocytes Absolute: 0.2
Monocytes Relative: 7
Neutro Abs: 1.6 — ABNORMAL LOW

## 2011-03-21 LAB — CBC
HCT: 25.9 — ABNORMAL LOW
Hemoglobin: 8.8 — ABNORMAL LOW
Hemoglobin: 8.8 — ABNORMAL LOW
MCHC: 34
MCV: 86.1
MCV: 86.4
RBC: 2.99 — ABNORMAL LOW
RBC: 3.02 — ABNORMAL LOW
WBC: 2.2 — ABNORMAL LOW
WBC: 2.4 — ABNORMAL LOW

## 2011-03-21 LAB — BASIC METABOLIC PANEL
BUN: 6
Chloride: 99
GFR calc Af Amer: >60
Potassium: 3.6
Sodium: 134 — ABNORMAL LOW

## 2011-03-22 LAB — DIFFERENTIAL
Basophils Absolute: 0
Basophils Relative: 0
Basophils Relative: 0
Eosinophils Absolute: 0
Eosinophils Relative: 0
Lymphocytes Relative: 15
Monocytes Absolute: 0.1 — ABNORMAL LOW
Neutro Abs: 1.7
Neutrophils Relative %: 80 — ABNORMAL HIGH
Neutrophils Relative %: 83 — ABNORMAL HIGH

## 2011-03-22 LAB — URINALYSIS, ROUTINE W REFLEX MICROSCOPIC
Bilirubin Urine: NEGATIVE
Nitrite: NEGATIVE
Specific Gravity, Urine: 1.015
Urobilinogen, UA: 1
Urobilinogen, UA: 1
pH: 6

## 2011-03-22 LAB — BASIC METABOLIC PANEL
BUN: 13
CO2: 27
CO2: 27
CO2: 29
Calcium: 8 — ABNORMAL LOW
Calcium: 8.1 — ABNORMAL LOW
Calcium: 8.2 — ABNORMAL LOW
Calcium: 8.4
Chloride: 85 — ABNORMAL LOW
Chloride: 87 — ABNORMAL LOW
Chloride: 93 — ABNORMAL LOW
Creatinine, Ser: 0.65
Creatinine, Ser: 0.76
GFR calc Af Amer: >60
GFR calc Af Amer: >60
GFR calc Af Amer: >60
GFR calc Af Amer: >60
GFR calc non Af Amer: >60
GFR calc non Af Amer: >60
Glucose, Bld: 78
Glucose, Bld: 80
Glucose, Bld: 98
Potassium: 3.6
Sodium: 120 — ABNORMAL LOW
Sodium: 123 — ABNORMAL LOW
Sodium: 125 — ABNORMAL LOW
Sodium: 125 — ABNORMAL LOW
Sodium: 132 — ABNORMAL LOW

## 2011-03-22 LAB — COMPREHENSIVE METABOLIC PANEL
ALT: 45
AST: 30
Alkaline Phosphatase: 55
CO2: 27
Chloride: 93 — ABNORMAL LOW
GFR calc Af Amer: >60
GFR calc non Af Amer: >60
Glucose, Bld: 98
Potassium: 3.3 — ABNORMAL LOW
Sodium: 128 — ABNORMAL LOW

## 2011-03-22 LAB — URINE MICROSCOPIC-ADD ON

## 2011-03-22 LAB — CBC
HCT: 34.3 — ABNORMAL LOW
Hemoglobin: 11.6 — ABNORMAL LOW
Hemoglobin: 8.8 — ABNORMAL LOW
Hemoglobin: 9.6 — ABNORMAL LOW
MCHC: 34
MCHC: 34.7
MCV: 86.3
RBC: 2.96 — ABNORMAL LOW
RBC: 3.26 — ABNORMAL LOW
RBC: 4.75
RDW: 20.5 — ABNORMAL HIGH
RDW: 21.6 — ABNORMAL HIGH
WBC: 19.4 — ABNORMAL HIGH
WBC: 2.8 — ABNORMAL LOW

## 2011-03-22 LAB — URINE CULTURE

## 2011-03-22 LAB — WOUND CULTURE

## 2011-03-23 LAB — CBC
HCT: 39.5
MCHC: 33.5
MCHC: 33
MCV: 84.3
MCV: 83.1
Platelets: 280
Platelets: 269
RBC: 4.74
RDW: 23.3 — ABNORMAL HIGH
RDW: 24.1 — ABNORMAL HIGH
WBC: 14.8 — ABNORMAL HIGH
WBC: 18.3 — ABNORMAL HIGH
WBC: 15.9 — ABNORMAL HIGH

## 2011-03-23 LAB — URINALYSIS, ROUTINE W REFLEX MICROSCOPIC
Bilirubin Urine: NEGATIVE
Nitrite: NEGATIVE
Specific Gravity, Urine: 1.011
Urobilinogen, UA: 1
pH: 6

## 2011-03-23 LAB — CK TOTAL AND CKMB (NOT AT ARMC): Total CK: 161

## 2011-03-23 LAB — I-STAT 8, (EC8 V) (CONVERTED LAB)
BUN: 9
Chloride: 104
Glucose, Bld: 109 — ABNORMAL HIGH
Hemoglobin: 14.6
Potassium: 3.7
Sodium: 139
pH, Ven: 7.451 — ABNORMAL HIGH

## 2011-03-23 LAB — COMPREHENSIVE METABOLIC PANEL
ALT: 22
AST: 19
CO2: 28
Chloride: 99
GFR calc Af Amer: >60
GFR calc non Af Amer: >60
Sodium: 134 — ABNORMAL LOW
Total Bilirubin: 0.9

## 2011-03-23 LAB — URINE CULTURE
Colony Count: 25000
Special Requests: NEGATIVE

## 2011-03-23 LAB — DIFFERENTIAL
Eosinophils Relative: 0
Lymphs Abs: 0.4 — ABNORMAL LOW
Monocytes Absolute: 0.3
Neutro Abs: 14.1 — ABNORMAL HIGH

## 2011-03-23 LAB — BASIC METABOLIC PANEL
BUN: 41 — ABNORMAL HIGH
Chloride: 90 — ABNORMAL LOW
Creatinine, Ser: 1.06
Glucose, Bld: 116 — ABNORMAL HIGH

## 2011-03-23 LAB — PROTIME-INR: Prothrombin Time: 12.8

## 2011-03-30 LAB — COMPREHENSIVE METABOLIC PANEL
ALT: 18
AST: 17
Albumin: 2.7 — ABNORMAL LOW
CO2: 28
Calcium: 8 — ABNORMAL LOW
Chloride: 107
Creatinine, Ser: 0.48
GFR calc Af Amer: >60
GFR calc non Af Amer: >60
Sodium: 141

## 2011-03-30 LAB — BASIC METABOLIC PANEL
BUN: <1 — ABNORMAL LOW
CO2: 31
Chloride: 105
Chloride: 108
GFR calc Af Amer: >60
GFR calc Af Amer: >60
GFR calc non Af Amer: >60
Glucose, Bld: 122 — ABNORMAL HIGH
Potassium: 3 — ABNORMAL LOW
Sodium: 141

## 2011-06-12 ENCOUNTER — Other Ambulatory Visit: Payer: Self-pay | Admitting: Internal Medicine

## 2011-06-12 ENCOUNTER — Telehealth: Payer: Self-pay | Admitting: Internal Medicine

## 2011-06-12 DIAGNOSIS — C321 Malignant neoplasm of supraglottis: Secondary | ICD-10-CM

## 2011-06-12 NOTE — Telephone Encounter (Signed)
Talked to pt's wife, gave her lab and CT appt on 1/25 then see MD on 07/12/11, she will pick up oral contrast before the appt date for CT.

## 2011-07-07 ENCOUNTER — Telehealth: Payer: Self-pay | Admitting: Internal Medicine

## 2011-07-07 ENCOUNTER — Other Ambulatory Visit: Payer: Self-pay | Admitting: Internal Medicine

## 2011-07-07 ENCOUNTER — Other Ambulatory Visit: Payer: Medicare Other | Admitting: Lab

## 2011-07-07 ENCOUNTER — Inpatient Hospital Stay (HOSPITAL_COMMUNITY)
Admission: RE | Admit: 2011-07-07 | Discharge: 2011-07-07 | Payer: Medicare Other | Source: Ambulatory Visit | Attending: Internal Medicine | Admitting: Internal Medicine

## 2011-07-07 NOTE — Telephone Encounter (Signed)
Called and cancelled labs and CT due to weather. I told him we will reschedule all his appointments.

## 2011-07-10 ENCOUNTER — Other Ambulatory Visit: Payer: Medicare Other | Admitting: Lab

## 2011-07-12 ENCOUNTER — Ambulatory Visit: Payer: Medicare Other | Admitting: Internal Medicine

## 2011-07-13 ENCOUNTER — Ambulatory Visit: Payer: Medicare Other | Admitting: Hematology and Oncology

## 2011-07-13 ENCOUNTER — Telehealth: Payer: Self-pay | Admitting: Internal Medicine

## 2011-07-13 NOTE — Telephone Encounter (Signed)
l/m with new appt  for 2/1 and 2/8 and to call with any ?   aom

## 2011-07-21 ENCOUNTER — Ambulatory Visit (HOSPITAL_COMMUNITY)
Admission: RE | Admit: 2011-07-21 | Discharge: 2011-07-21 | Disposition: A | Discharge status: Home / Self Care | Payer: Medicare Other | Source: Ambulatory Visit | Attending: Internal Medicine | Admitting: Internal Medicine

## 2011-07-21 ENCOUNTER — Encounter (HOSPITAL_COMMUNITY): Payer: Self-pay

## 2011-07-21 ENCOUNTER — Other Ambulatory Visit (HOSPITAL_BASED_OUTPATIENT_CLINIC_OR_DEPARTMENT_OTHER): Payer: Medicare Other | Admitting: Lab

## 2011-07-21 DIAGNOSIS — I700 Atherosclerosis of aorta: Secondary | ICD-10-CM | POA: Insufficient documentation

## 2011-07-21 DIAGNOSIS — C3410 Malignant neoplasm of upper lobe, unspecified bronchus or lung: Secondary | ICD-10-CM

## 2011-07-21 DIAGNOSIS — N289 Disorder of kidney and ureter, unspecified: Secondary | ICD-10-CM | POA: Insufficient documentation

## 2011-07-21 DIAGNOSIS — K5730 Diverticulosis of large intestine without perforation or abscess without bleeding: Secondary | ICD-10-CM | POA: Insufficient documentation

## 2011-07-21 DIAGNOSIS — I2510 Atherosclerotic heart disease of native coronary artery without angina pectoris: Secondary | ICD-10-CM | POA: Insufficient documentation

## 2011-07-21 DIAGNOSIS — Z930 Tracheostomy status: Secondary | ICD-10-CM | POA: Insufficient documentation

## 2011-07-21 DIAGNOSIS — C321 Malignant neoplasm of supraglottis: Secondary | ICD-10-CM | POA: Insufficient documentation

## 2011-07-21 DIAGNOSIS — K869 Disease of pancreas, unspecified: Secondary | ICD-10-CM | POA: Insufficient documentation

## 2011-07-21 DIAGNOSIS — C3490 Malignant neoplasm of unspecified part of unspecified bronchus or lung: Secondary | ICD-10-CM | POA: Insufficient documentation

## 2011-07-21 DIAGNOSIS — I714 Abdominal aortic aneurysm, without rupture, unspecified: Secondary | ICD-10-CM | POA: Insufficient documentation

## 2011-07-21 DIAGNOSIS — Z923 Personal history of irradiation: Secondary | ICD-10-CM | POA: Insufficient documentation

## 2011-07-21 DIAGNOSIS — N433 Hydrocele, unspecified: Secondary | ICD-10-CM | POA: Insufficient documentation

## 2011-07-21 DIAGNOSIS — K7689 Other specified diseases of liver: Secondary | ICD-10-CM | POA: Insufficient documentation

## 2011-07-21 DIAGNOSIS — I359 Nonrheumatic aortic valve disorder, unspecified: Secondary | ICD-10-CM | POA: Insufficient documentation

## 2011-07-21 DIAGNOSIS — Z9221 Personal history of antineoplastic chemotherapy: Secondary | ICD-10-CM | POA: Insufficient documentation

## 2011-07-21 HISTORY — DX: Malignant (primary) neoplasm, unspecified: C80.1

## 2011-07-21 LAB — CBC WITH DIFFERENTIAL/PLATELET
Basophils Absolute: 0 10*3/uL (ref 0.0–0.1)
EOS%: 1 % (ref 0.0–7.0)
Eosinophils Absolute: 0.1 10*3/uL (ref 0.0–0.5)
HCT: 47 % (ref 38.4–49.9)
HGB: 15.6 g/dL (ref 13.0–17.1)
LYMPH%: 16.2 % (ref 14.0–49.0)
MCH: 27.7 pg (ref 27.2–33.4)
MCV: 83.7 fL (ref 79.3–98.0)
MONO%: 5.3 % (ref 0.0–14.0)
NEUT#: 5.7 10*3/uL (ref 1.5–6.5)
NEUT%: 77.1 % — ABNORMAL HIGH (ref 39.0–75.0)
Platelets: 262 10*3/uL (ref 140–400)

## 2011-07-21 LAB — CMP (CANCER CENTER ONLY)
Albumin: 3.9 g/dL (ref 3.3–5.5)
Alkaline Phosphatase: 69 U/L (ref 26–84)
BUN, Bld: 13 mg/dL (ref 7–22)
Creat: 0.9 mg/dl (ref 0.6–1.2)
Glucose, Bld: 104 mg/dL (ref 73–118)
Potassium: 4.3 mEq/L (ref 3.3–4.7)
Total Bilirubin: 0.6 mg/dl (ref 0.20–1.60)

## 2011-07-21 MED ORDER — IOHEXOL 300 MG/ML  SOLN
100.0000 mL | Freq: Once | INTRAMUSCULAR | Status: AC | PRN
  Administered 2011-07-21: 100 mL via INTRAVENOUS

## 2011-07-24 ENCOUNTER — Ambulatory Visit (HOSPITAL_BASED_OUTPATIENT_CLINIC_OR_DEPARTMENT_OTHER): Payer: Medicare Other | Admitting: Internal Medicine

## 2011-07-24 ENCOUNTER — Telehealth: Payer: Self-pay | Admitting: Internal Medicine

## 2011-07-24 DIAGNOSIS — C3490 Malignant neoplasm of unspecified part of unspecified bronchus or lung: Secondary | ICD-10-CM

## 2011-07-24 DIAGNOSIS — C329 Malignant neoplasm of larynx, unspecified: Secondary | ICD-10-CM

## 2011-07-24 DIAGNOSIS — C321 Malignant neoplasm of supraglottis: Secondary | ICD-10-CM

## 2011-07-24 NOTE — Progress Notes (Signed)
[facility] OFFICE PROGRESS NOTE  PRINCIPAL DIAGNOSES:   1. 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 2. Stage IA non-small-cell lung cancer diagnosed in June 2011.  PRIOR THERAPY:   1. Status post total laryngectomy with. Bilateral selective lymph node dissection as well as left superior parotidectomy with nerve dissection under the care of Dr. Jenne Pane with positive resection margin. 2. Status post course of concurrent chemoradiation with weekly cisplatin.  Last dose of chemotherapy was given January 08, 2007. 3. Status post curative radiotherapy with tomotherapy to the lung lesions completed December 31, 2009 under the care of Dr. Michell Heinrich.  CURRENT THERAPY:  Observation.  INTERVAL HISTORY: [name] 71 y.o. male returns to the clinic today for six-month followup visit accompanied his wife. The patient has no complaints today but he was very anxious about his scan results. His blood pressure was elevated. He denied having any significant weight loss or night sweats. Eyes no chest pain but continues to have baseline shortness of breath. The patient has tracheostomy in place with a voice box. He has repeat CT scan of the chest, abdomen and pelvis performed recently and is here today for evaluation and discussion of his scan results.  MEDICAL HISTORY: Past Medical History  Diagnosis Date  . Cancer     SUPERGLOTTIS INVASIVE SQ CELL CA    ALLERGIES:   has no known allergies.  MEDICATIONS:  No current outpatient prescriptions on file.    REVIEW OF SYSTEMS:  A comprehensive review of systems was negative except for: Constitutional: positive for fatigue Respiratory: positive for dyspnea on exertion   PHYSICAL EXAMINATION: General appearance: alert, cooperative and no distress Neck: no adenopathy Lymph nodes: Cervical, supraclavicular, and axillary nodes normal. Resp: clear to auscultation bilaterally Cardio: regular  rate and rhythm, S1, S2 normal, no murmur, click, rub or gallop GI: soft, non-tender; bowel sounds normal; no masses,  no organomegaly Extremities: extremities normal, atraumatic, no cyanosis or edema  ECOG PERFORMANCE STATUS: 1 - Symptomatic but completely ambulatory  Blood pressure 186/99, pulse 89, temperature 97.6 F (36.4 C), temperature source Oral, height 5\' 5"  (1.651 m), weight 192 lb (87.091 kg).  LABORATORY DATA: Lab Results  Component Value Date   WBC 7.4 07/21/2011   HGB 15.6 07/21/2011   HCT 47.0 07/21/2011   MCV 83.7 07/21/2011   PLT 262 07/21/2011      Chemistry      Component Value Date/Time   NA 140 07/21/2011 1401   NA 141 03/31/2010 1005   K 4.3 07/21/2011 1401   K 3.7 03/31/2010 1005   CL 99 07/21/2011 1401   CL 105 03/31/2010 1005   CO2 29 07/21/2011 1401   CO2 31 03/31/2010 1005   BUN 13 07/21/2011 1401   BUN 12 03/31/2010 1005   CREATININE 0.9 07/21/2011 1401   CREATININE 1.02 03/31/2010 1005      Component Value Date/Time   CALCIUM 8.9 07/21/2011 1401   CALCIUM 9.0 03/31/2010 1005   ALKPHOS 69 07/21/2011 1401   ALKPHOS 60 03/31/2010 1005   AST 30 07/21/2011 1401   AST 21 03/31/2010 1005   ALT 19 03/31/2010 1005   BILITOT 0.60 07/21/2011 1401   BILITOT 0.4 03/31/2010 1005       RADIOGRAPHIC STUDIES: Ct Chest W Contrast  07/21/2011  *RADIOLOGY REPORT*  Clinical Data:  Invasive squamous cell carcinoma of the supraglottic regions status post chemo and radiation therapy  completed in 2008.  Squamous cell lung cancer diagnosed 11/18/2009 status post tomotherapy completed July 2011.  CT CHEST, ABDOMEN AND PELVIS WITH CONTRAST  Technique:  Multidetector CT imaging of the chest, abdomen and pelvis was performed following the standard protocol during bolus administration of intravenous contrast.  Contrast: OMNIPAQUE IOHEXOL 300 MG/ML IV SOLN  Comparison:  CT of chest abdomen and pelvis dated 01/02/2011.   CT CHEST  Findings:  Mediastinum: Heart size is normal. There is no  significant pericardial fluid, thickening or pericardial calcification. There is atherosclerosis of the thoracic aorta, the great vessels of the mediastinum and the coronary arteries, including calcified atherosclerotic plaque in the left main, left anterior descending, left circumflex and right coronary arteries. Extensive aortic valve calcifications No pathologically enlarged mediastinal or hilar lymph nodes. Status post laryngectomy.  Tracheostomy tube in place. The esophagus is patulous and gas-filled.  Lungs/Pleura: Again noted are chronic post treatment related changes in the right upper lobe.  No definite focal soft tissue mass is noted in this region to suggest local recurrence of disease. There are otherwise no new or enlarging suspicious appearing pulmonary nodules or masses identified on today's CT examination.  Linear opacity in the left lower lobe and focal density in the inferior segment of the lingula are unchanged, most compatible with scarring.  No consolidative airspace disease.  No pleural effusions.  Musculoskeletal: There are no aggressive appearing lytic or blastic lesions noted in the visualized portions of the skeleton.  IMPRESSION: 1.  No findings to suggest disease recurrence within the thorax. 2.  Similar post-treatment related changes in the right upper lobe again noted. 3. Atherosclerosis, including left main and three-vessel coronary artery disease. Please note that although the presence of coronary artery calcium documents the presence of coronary artery disease, the severity of this disease and any potential stenosis cannot be assessed on this non-gated CT examination.  Assessment for potential risk factor modification, dietary therapy or pharmacologic therapy may be warranted, if clinically indicated.  4. There are extensive calcifications of the aortic valve. Echocardiographic correlation for evaluation of potential valvular dysfunction may be warranted if clinically indicated.     CT ABDOMEN AND PELVIS  Findings:  Abdomen/Pelvis: Multiple low-attenuation lesions are again seen scattered throughout the hepatic parenchyma.  These appears similar in size, number and distribution compared to the prior examination from 01/02/2011, with the largest lesion measuring up to 2.3 mm in diameter and segments six (image 5 of series 2).  No new hepatic lesions are confidently identified.  The enhanced appearance of the gallbladder, spleen, bilateral adrenal glands and the right kidney is unremarkable.  In the lower pole of the left kidney there is a 1.9 cm low attenuation lesion consistent with a cyst (similar prior).  In the tail of the pancreas (image 47 of series 2) there is a tiny low attenuation lesion with peripheral calcification measures approximate 6 mm.  This is unchanged compared to the prior, and may represent a tiny pancreatic pseudocyst.  Extensive vascular calcifications throughout the abdomen and pelvis, with focal fusiform infrarenal abdominal aortic aneurysm (that begins immediately below the inferior mesenteric artery origin, and ends just above the bifurcation) measuring up to 4.1 x 4.3 cm in diameter (measured on axial image 80 of series 2), slightly increased compared to the prior examination.There are a few colonic diverticula, predominately in the descending colon and sigmoid colon, without surrounding inflammatory changes at this time to suggest an acute diverticulitis.  Normal appendix.  No ascites or  pneumoperitoneum and no pathologic distension of bowel. Urinary bladder is unremarkable in appearance.  No definite pathologically enlarged lymph nodes noted in the abdomen or pelvis. Incidental imaging through the scrotum demonstrates a small right hydrocele.  Musculoskeletal: There are no aggressive appearing lytic or blastic lesions noted in the visualized portions of the skeleton.  IMPRESSION:  1. Multiple previously noted liver lesions appear unchanged compared to the prior  examination.  Their stability over time is reassuring, however, many of these lesions remain indeterminate in appearance. 2.  Otherwise, there is no evidence to suggest metastatic disease to the abdomen or pelvis on today's examination. 3.  Extensive atherosclerosis, including a focal fusiform infrarenal abdominal aortic aneurysm that has slightly increased in size, currently measuring 4.3 x 4.1 cm. 4.  Colonic diverticulosis (predominantly of the descending colon and sigmoid colon) without findings to suggest acute diverticulitis at this time. 5.  Small right-sided hydrocele incidentally noted.  Original Report Authenticated By: Florencia Reasons, M.D.   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 The patient is doing fine and he has no evidence for disease recurrence.  PLAN: I discussed the scan results with the patient his wife and recommended for him continuous observation for now with repeat CT scan of the neck, chest, abdomen and pelvis in 6 months. He was advised to call me immediately if he has any concerning symptoms in the interval   All questions were answered. The patient knows to call the clinic with any problems, questions or concerns. We can certainly see the patient much sooner if necessary.

## 2011-07-24 NOTE — Telephone Encounter (Signed)
appt made and printed for 8/9 and 8/12   aom 

## 2012-01-19 ENCOUNTER — Encounter (HOSPITAL_COMMUNITY): Payer: Self-pay

## 2012-01-19 ENCOUNTER — Other Ambulatory Visit (HOSPITAL_BASED_OUTPATIENT_CLINIC_OR_DEPARTMENT_OTHER): Payer: Medicare Other | Admitting: Lab

## 2012-01-19 ENCOUNTER — Ambulatory Visit (HOSPITAL_COMMUNITY)
Admission: RE | Admit: 2012-01-19 | Discharge: 2012-01-19 | Disposition: A | Discharge status: Home / Self Care | Payer: Medicare Other | Source: Ambulatory Visit | Attending: Internal Medicine | Admitting: Internal Medicine

## 2012-01-19 DIAGNOSIS — Z930 Tracheostomy status: Secondary | ICD-10-CM | POA: Insufficient documentation

## 2012-01-19 DIAGNOSIS — K862 Cyst of pancreas: Secondary | ICD-10-CM | POA: Insufficient documentation

## 2012-01-19 DIAGNOSIS — K7689 Other specified diseases of liver: Secondary | ICD-10-CM | POA: Insufficient documentation

## 2012-01-19 DIAGNOSIS — K5730 Diverticulosis of large intestine without perforation or abscess without bleeding: Secondary | ICD-10-CM | POA: Insufficient documentation

## 2012-01-19 DIAGNOSIS — I7090 Unspecified atherosclerosis: Secondary | ICD-10-CM | POA: Insufficient documentation

## 2012-01-19 DIAGNOSIS — I6529 Occlusion and stenosis of unspecified carotid artery: Secondary | ICD-10-CM | POA: Insufficient documentation

## 2012-01-19 DIAGNOSIS — C3490 Malignant neoplasm of unspecified part of unspecified bronchus or lung: Secondary | ICD-10-CM

## 2012-01-19 DIAGNOSIS — C329 Malignant neoplasm of larynx, unspecified: Secondary | ICD-10-CM

## 2012-01-19 DIAGNOSIS — J984 Other disorders of lung: Secondary | ICD-10-CM | POA: Insufficient documentation

## 2012-01-19 DIAGNOSIS — I359 Nonrheumatic aortic valve disorder, unspecified: Secondary | ICD-10-CM | POA: Insufficient documentation

## 2012-01-19 DIAGNOSIS — R911 Solitary pulmonary nodule: Secondary | ICD-10-CM | POA: Insufficient documentation

## 2012-01-19 DIAGNOSIS — K863 Pseudocyst of pancreas: Secondary | ICD-10-CM | POA: Insufficient documentation

## 2012-01-19 DIAGNOSIS — C321 Malignant neoplasm of supraglottis: Secondary | ICD-10-CM | POA: Insufficient documentation

## 2012-01-19 LAB — CMP (CANCER CENTER ONLY)
Albumin: 3.7 g/dL (ref 3.3–5.5)
Alkaline Phosphatase: 64 U/L (ref 26–84)
BUN, Bld: 14 mg/dL (ref 7–22)
Calcium: 9.5 mg/dL (ref 8.0–10.3)
Chloride: 96 mEq/L — ABNORMAL LOW (ref 98–108)
Creat: 1.2 mg/dl (ref 0.6–1.2)
Glucose, Bld: 114 mg/dL (ref 73–118)
Potassium: 4.1 mEq/L (ref 3.3–4.7)

## 2012-01-19 LAB — CBC WITH DIFFERENTIAL/PLATELET
Basophils Absolute: 0 10*3/uL (ref 0.0–0.1)
Eosinophils Absolute: 0.1 10*3/uL (ref 0.0–0.5)
HCT: 45.7 % (ref 38.4–49.9)
HGB: 15.1 g/dL (ref 13.0–17.1)
MCV: 82.7 fL (ref 79.3–98.0)
MONO%: 8.8 % (ref 0.0–14.0)
NEUT#: 4.1 10*3/uL (ref 1.5–6.5)
NEUT%: 68.5 % (ref 39.0–75.0)
RDW: 15.9 % — ABNORMAL HIGH (ref 11.0–14.6)

## 2012-01-19 MED ORDER — IOHEXOL 300 MG/ML  SOLN
125.0000 mL | Freq: Once | INTRAMUSCULAR | Status: AC | PRN
  Administered 2012-01-19: 125 mL via INTRAVENOUS

## 2012-01-22 ENCOUNTER — Ambulatory Visit (HOSPITAL_BASED_OUTPATIENT_CLINIC_OR_DEPARTMENT_OTHER): Payer: Medicare Other | Admitting: Internal Medicine

## 2012-01-22 ENCOUNTER — Telehealth: Payer: Self-pay | Admitting: Internal Medicine

## 2012-01-22 VITALS — BP 175/94 | HR 92 | Temp 98.0°F | Resp 20 | Wt 201.3 lb

## 2012-01-22 DIAGNOSIS — C321 Malignant neoplasm of supraglottis: Secondary | ICD-10-CM

## 2012-01-22 DIAGNOSIS — K769 Liver disease, unspecified: Secondary | ICD-10-CM

## 2012-01-22 DIAGNOSIS — C3491 Malignant neoplasm of unspecified part of right bronchus or lung: Secondary | ICD-10-CM | POA: Insufficient documentation

## 2012-01-22 DIAGNOSIS — C3490 Malignant neoplasm of unspecified part of unspecified bronchus or lung: Secondary | ICD-10-CM

## 2012-01-22 DIAGNOSIS — K7689 Other specified diseases of liver: Secondary | ICD-10-CM

## 2012-01-22 DIAGNOSIS — C329 Malignant neoplasm of larynx, unspecified: Secondary | ICD-10-CM | POA: Insufficient documentation

## 2012-01-22 DIAGNOSIS — C3410 Malignant neoplasm of upper lobe, unspecified bronchus or lung: Secondary | ICD-10-CM

## 2012-01-22 NOTE — Telephone Encounter (Signed)
Gave pt appt for February 2014 lab and Ct, gave pt oral contrast, NPO 4 hours prior to CT, MD visit after a few days

## 2012-01-22 NOTE — Progress Notes (Signed)
[facility] [phone]   [phone]  OFFICE PROGRESS NOTE  Alva Garnet., MD [address] [place] [address]  PRINCIPAL DIAGNOSES:  1. 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 2. Stage IA non-small-cell lung cancer diagnosed in June 2011.  PRIOR THERAPY:  1. Status post total laryngectomy with. Bilateral selective lymph node dissection as well as left superior parotidectomy with nerve dissection under the care of Dr. Jenne Pane with positive resection margin. 2. Status post course of concurrent chemoradiation with weekly cisplatin. Last dose of chemotherapy was given January 08, 2007. 3. Status post curative radiotherapy with tomotherapy to the lung lesions completed December 31, 2009 under the care of Dr. Michell Heinrich.  CURRENT THERAPY: Observation.  INTERVAL HISTORY: [name] 71 y.o. male returns to the clinic today for followup visit accompanied by his wife. The patient is doing fine today with no specific complaints. He denied having any significant weight loss or night sweats. He denied having any chest pain, shortness breath, cough or hemoptysis. The patient also denied having any swallowing problems. He continues to have some issues with hysterectomy spatially fungal infection and he is currently on treatment with nystatin. He has repeat CT scan of the neck, chest, abdomen and pelvis performed recently and he is here today for evaluation and discussion of his scan results.   MEDICAL HISTORY: Past Medical History  Diagnosis Date  . Cancer     SUPERGLOTTIS INVASIVE SQ CELL CA    ALLERGIES:   has no known allergies.  MEDICATIONS:  Current Outpatient Prescriptions  Medication Sig Dispense Refill  . baclofen (LIORESAL) 10 MG tablet Take 10 mg by mouth Daily.      . finasteride (PROSCAR) 5 MG tablet Take 5 mg by mouth Daily.      Marland Kitchen LORazepam (ATIVAN) 0.5 MG tablet Take 0.5 mg  by mouth Three times a day.      . metoprolol succinate (TOPROL-XL) 25 MG 24 hr tablet Take 25 mg by mouth Daily.      . simvastatin (ZOCOR) 20 MG tablet Take 20 mg by mouth Daily.        REVIEW OF SYSTEMS:  A comprehensive review of systems was negative.   PHYSICAL EXAMINATION: General appearance: alert, cooperative and no distress Head: Normocephalic, without obvious abnormality, atraumatic Neck: no adenopathy and Tracheostomy in place with no signs of infection. Lymph nodes: Cervical, supraclavicular, and axillary nodes normal. Resp: clear to auscultation bilaterally Cardio: regular rate and rhythm, S1, S2 normal, no murmur, click, rub or gallop GI: soft, non-tender; bowel sounds normal; no masses,  no organomegaly Extremities: extremities normal, atraumatic, no cyanosis or edema  ECOG PERFORMANCE STATUS: 1 - Symptomatic but completely ambulatory  Blood pressure 175/94, pulse 92, temperature 98 F (36.7 C), temperature source Oral, resp. rate 20, weight 201 lb 4.8 oz (91.309 kg).  LABORATORY DATA: Lab Results  Component Value Date   WBC 6.0 01/19/2012   HGB 15.1 01/19/2012   HCT 45.7 01/19/2012   MCV 82.7 01/19/2012   PLT 194 01/19/2012      Chemistry      Component Value Date/Time   NA 140 07/21/2011 1401   NA 141 03/31/2010 1005   K 4.3 07/21/2011 1401   K 3.7 03/31/2010 1005   CL 99 07/21/2011 1401   CL 105 03/31/2010 1005   CO2 29 07/21/2011 1401   CO2 31 03/31/2010 1005   BUN  13 07/21/2011 1401   BUN 12 03/31/2010 1005   CREATININE 0.9 07/21/2011 1401   CREATININE 1.02 03/31/2010 1005      Component Value Date/Time   CALCIUM 8.9 07/21/2011 1401   CALCIUM 9.0 03/31/2010 1005   ALKPHOS 69 07/21/2011 1401   ALKPHOS 60 03/31/2010 1005   AST 30 07/21/2011 1401   AST 21 03/31/2010 1005   ALT 19 03/31/2010 1005   BILITOT 0.60 07/21/2011 1401   BILITOT 0.4 03/31/2010 1005       RADIOGRAPHIC STUDIES: Ct Soft Tissue Neck W Contrast  01/19/2012  *RADIOLOGY REPORT*  Clinical Data:  71 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 Restaging.  CT NECK WITH CONTRAST  Technique:  Multidetector CT imaging of the neck was performed with intravenous contrast.  Contrast: OMNIPAQUE IOHEXOL 300 MG/ML  SOLN In conjunction with CT(s) of the chest abdomen and pelvis which is(are) reported separately.  Comparison: 01/02/2011 and earlier.  Findings: Sequelae of laryngectomy.  Tracheostomy / tracheal stoma. Chest findings are reported separately.  Satisfactory postoperative appearance of the thyroid, pharynx, retropharyngeal space, parapharyngeal spaces and sublingual space. Stable post XRT appearance of the carotid spaces and lymph nodal stations.  Submandibular and parotid glands appear satisfactory.  No cervical lymphadenopathy.  No discrete hyperenhancing neck mass.  Major vascular structures are patent.  There is extensive carotid bifurcation atherosclerosis and right common carotid atherosclerosis as before.  Negative visualized brain parenchyma. Stable paranasal sinuses.  Stable mastoids with continued opacification inferiorly on the left. No acute osseous abnormality identified.  IMPRESSION: 1.  Stable and satisfactory post-therapy appearance of the neck. 2.  Chest abdomen and pelvis CT findings are reported separately.  Original Report Authenticated By: Harley Hallmark, M.D.   Ct Chest W Contrast  01/19/2012  *RADIOLOGY REPORT*  Clinical Data:  Squamous cell lung cancer.  Invasive supraglottic cancer.  CT CHEST, ABDOMEN AND PELVIS WITH CONTRAST  Technique:  Multidetector CT imaging of the chest, abdomen and pelvis was performed following the standard protocol during bolus administration of intravenous contrast.  Contrast: OMNIPAQUE IOHEXOL 300 MG/ML  SOLN  Comparison:  07/21/2011  CT CHEST  Findings:  Tracheostomy tube noted.  There is a mildly dilated and air-filled esophagus.  Stable atherosclerosis noted.  No pathologic thoracic adenopathy. Stable aortic valve calcifications.   Stable scarring noted in the right upper lobe.  Stable mild scarring in the left lower lobe.  Left upper lobe 0.4 cm nodule, image 15 of series 7, slightly increased conspicuity compared to prior exams, merit attention on follow-up.  IMPRESSION:  1.  4 mm left upper lobe nodule is slightly increased conspicuity compared to prior exams and merits attention on follow-up, although may well be benign. 2.  Stable atherosclerosis.  Stable post therapy related findings.  CT ABDOMEN AND PELVIS  Findings:  Multiple complex liver lesions are again noted.  When I compare back to the MRI of the abdomen and 2011, the larger lesions appear to be increasing in size.  For example, lesion in the dome of the medial segment left hepatic lobe measures 1.9 cm, formerly 1.2 cm on that exam; the lesion inferiorly in the right hepatic lobe on image 56 of series 2 measures 2.5 cm in diameter and was formerly 1.8 cm in diameter.  The spleen appears unremarkable.  Small cystic lesion with several tiny adjacent calcifications noted in the tail of the pancreas along its tip.  The adrenal glands and kidneys appear unremarkable.  Aortoiliac atherosclerosis is present with a  4.1 cm infrarenal abdominal aortic aneurysm, similar to the prior exam, just above the bifurcation.  The appendix appears normal.  Descending and sigmoid diverticulosis noted with a mildly redundant sigmoid colon.  Urinary bladder appears unremarkable. No pathologic retroperitoneal or porta hepatis adenopathy is identified.  No pathologic pelvic adenopathy is identified.  IMPRESSION:  1.  Slow growing complex hepatic lesions, suspicious for metastatic disease.  I do note that the lesions were not hypermetabolic with respect to the hepatic parenchyma on prior PET CT of 10/12/2010. 2.  Atherosclerosis with 4.1 cm infrarenal abdominal aortic aneurysm. 3.  Descending and sigmoid diverticulosis. 4.  Stable small cystic lesion along the tip of the tail of the pancreas, nonspecific  but unchanged over the last several years.  Original Report Authenticated By: Dellia Cloud, M.D.   Ct Abdomen Pelvis W Contrast  01/19/2012  *RADIOLOGY REPORT*  Clinical Data:  Squamous cell lung cancer.  Invasive supraglottic cancer.  CT CHEST, ABDOMEN AND PELVIS WITH CONTRAST  Technique:  Multidetector CT imaging of the chest, abdomen and pelvis was performed following the standard protocol during bolus administration of intravenous contrast.  Contrast: OMNIPAQUE IOHEXOL 300 MG/ML  SOLN  Comparison:  07/21/2011  CT CHEST  Findings:  Tracheostomy tube noted.  There is a mildly dilated and air-filled esophagus.  Stable atherosclerosis noted.  No pathologic thoracic adenopathy. Stable aortic valve calcifications.  Stable scarring noted in the right upper lobe.  Stable mild scarring in the left lower lobe.  Left upper lobe 0.4 cm nodule, image 15 of series 7, slightly increased conspicuity compared to prior exams, merit attention on follow-up.  IMPRESSION:  1.  4 mm left upper lobe nodule is slightly increased conspicuity compared to prior exams and merits attention on follow-up, although may well be benign. 2.  Stable atherosclerosis.  Stable post therapy related findings.  CT ABDOMEN AND PELVIS  Findings:  Multiple complex liver lesions are again noted.  When I compare back to the MRI of the abdomen and 2011, the larger lesions appear to be increasing in size.  For example, lesion in the dome of the medial segment left hepatic lobe measures 1.9 cm, formerly 1.2 cm on that exam; the lesion inferiorly in the right hepatic lobe on image 56 of series 2 measures 2.5 cm in diameter and was formerly 1.8 cm in diameter.  The spleen appears unremarkable.  Small cystic lesion with several tiny adjacent calcifications noted in the tail of the pancreas along its tip.  The adrenal glands and kidneys appear unremarkable.  Aortoiliac atherosclerosis is present with a 4.1 cm infrarenal abdominal aortic aneurysm,  similar to the prior exam, just above the bifurcation.  The appendix appears normal.  Descending and sigmoid diverticulosis noted with a mildly redundant sigmoid colon.  Urinary bladder appears unremarkable. No pathologic retroperitoneal or porta hepatis adenopathy is identified.  No pathologic pelvic adenopathy is identified.  IMPRESSION:  1.  Slow growing complex hepatic lesions, suspicious for metastatic disease.  I do note that the lesions were not hypermetabolic with respect to the hepatic parenchyma on prior PET CT of 10/12/2010. 2.  Atherosclerosis with 4.1 cm infrarenal abdominal aortic aneurysm. 3.  Descending and sigmoid diverticulosis. 4.  Stable small cystic lesion along the tip of the tail of the pancreas, nonspecific but unchanged over the last several years.  Original Report Authenticated By: Dellia Cloud, M.D.    ASSESSMENT: This is a very pleasant 71 years old [place] American male with history of stage  IVC supraglottic invasive squamous cell carcinoma diagnosed in may of 2008 in addition to a stage IA non-small cell lung cancer diagnosed in June of 2011. The patient is doing fine with no evidence for disease recurrence. He still has suspicious cystic lesion in the liver. PET scan that was performed previously showed no FDG avid activity and biopsy of one of the lesion was negative for malignancy.  PLAN: I discussed the scan results with the patient and his wife. I recommended for him continuous observation for now with repeat CT scan of the chest, abdomen and pelvis in 6 months. The patient would come back for followup visit at that time.  He was advised to call me immediately if he has any concerning symptoms in the interval.  All questions were answered. The patient knows to call the clinic with any problems, questions or concerns. We can certainly see the patient much sooner if necessary.

## 2012-07-16 ENCOUNTER — Telehealth: Payer: Self-pay | Admitting: Internal Medicine

## 2012-07-19 ENCOUNTER — Other Ambulatory Visit: Payer: Medicare Other | Admitting: Lab

## 2012-07-19 ENCOUNTER — Ambulatory Visit (HOSPITAL_COMMUNITY): Payer: Medicare Other

## 2012-07-22 ENCOUNTER — Ambulatory Visit: Payer: Medicare Other | Admitting: Internal Medicine

## 2012-07-31 ENCOUNTER — Other Ambulatory Visit: Payer: Self-pay | Admitting: Medical Oncology

## 2012-08-01 ENCOUNTER — Other Ambulatory Visit: Payer: Self-pay | Admitting: Medical Oncology

## 2012-08-02 ENCOUNTER — Ambulatory Visit (HOSPITAL_COMMUNITY)
Admission: RE | Admit: 2012-08-02 | Discharge: 2012-08-02 | Disposition: A | Discharge status: Home / Self Care | Payer: Medicare Other | Source: Ambulatory Visit | Attending: Internal Medicine | Admitting: Internal Medicine

## 2012-08-02 ENCOUNTER — Encounter (HOSPITAL_COMMUNITY): Payer: Self-pay

## 2012-08-02 ENCOUNTER — Other Ambulatory Visit (HOSPITAL_BASED_OUTPATIENT_CLINIC_OR_DEPARTMENT_OTHER): Payer: Medicare Other | Admitting: Lab

## 2012-08-02 DIAGNOSIS — C329 Malignant neoplasm of larynx, unspecified: Secondary | ICD-10-CM

## 2012-08-02 DIAGNOSIS — K769 Liver disease, unspecified: Secondary | ICD-10-CM

## 2012-08-02 DIAGNOSIS — I714 Abdominal aortic aneurysm, without rupture, unspecified: Secondary | ICD-10-CM | POA: Insufficient documentation

## 2012-08-02 DIAGNOSIS — I2510 Atherosclerotic heart disease of native coronary artery without angina pectoris: Secondary | ICD-10-CM | POA: Insufficient documentation

## 2012-08-02 DIAGNOSIS — J9819 Other pulmonary collapse: Secondary | ICD-10-CM | POA: Insufficient documentation

## 2012-08-02 DIAGNOSIS — N281 Cyst of kidney, acquired: Secondary | ICD-10-CM | POA: Insufficient documentation

## 2012-08-02 DIAGNOSIS — R911 Solitary pulmonary nodule: Secondary | ICD-10-CM | POA: Insufficient documentation

## 2012-08-02 DIAGNOSIS — C787 Secondary malignant neoplasm of liver and intrahepatic bile duct: Secondary | ICD-10-CM | POA: Insufficient documentation

## 2012-08-02 DIAGNOSIS — C3490 Malignant neoplasm of unspecified part of unspecified bronchus or lung: Secondary | ICD-10-CM

## 2012-08-02 HISTORY — DX: Essential (primary) hypertension: I10

## 2012-08-02 LAB — CBC WITH DIFFERENTIAL/PLATELET
BASO%: 0.5 % (ref 0.0–2.0)
LYMPH%: 14.2 % (ref 14.0–49.0)
MCHC: 32.8 g/dL (ref 32.0–36.0)
MONO#: 0.5 10*3/uL (ref 0.1–0.9)
Platelets: 211 10*3/uL (ref 140–400)
RBC: 5.65 10*6/uL (ref 4.20–5.82)
RDW: 15.5 % — ABNORMAL HIGH (ref 11.0–14.6)
WBC: 7.4 10*3/uL (ref 4.0–10.3)

## 2012-08-02 LAB — COMPREHENSIVE METABOLIC PANEL (CC13)
ALT: 16 U/L (ref 0–55)
Alkaline Phosphatase: 89 U/L (ref 40–150)
CO2: 25 mEq/L (ref 22–29)
Sodium: 138 mEq/L (ref 136–145)
Total Bilirubin: 0.38 mg/dL (ref 0.20–1.20)
Total Protein: 7.4 g/dL (ref 6.4–8.3)

## 2012-08-02 MED ORDER — IOHEXOL 300 MG/ML  SOLN
100.0000 mL | Freq: Once | INTRAMUSCULAR | Status: AC | PRN
  Administered 2012-08-02: 100 mL via INTRAVENOUS

## 2012-08-06 ENCOUNTER — Encounter: Payer: Self-pay | Admitting: Internal Medicine

## 2012-08-06 ENCOUNTER — Ambulatory Visit (HOSPITAL_BASED_OUTPATIENT_CLINIC_OR_DEPARTMENT_OTHER): Payer: Medicare Other | Admitting: Internal Medicine

## 2012-08-06 VITALS — BP 180/97 | HR 102 | Temp 97.5°F | Resp 20 | Ht 65.0 in | Wt 193.1 lb

## 2012-08-06 DIAGNOSIS — C321 Malignant neoplasm of supraglottis: Secondary | ICD-10-CM

## 2012-08-06 DIAGNOSIS — C329 Malignant neoplasm of larynx, unspecified: Secondary | ICD-10-CM

## 2012-08-06 NOTE — Progress Notes (Signed)
[facility] [phone]   Fax:([phone]  OFFICE PROGRESS NOTE  Alva Garnet., MD [address] [place] [address]  PRINCIPAL DIAGNOSES:  1. 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 2. Stage IA non-small-cell lung cancer diagnosed in June 2011.  PRIOR THERAPY:  1. Status post total laryngectomy with. Bilateral selective lymph node dissection as well as left superior parotidectomy with nerve dissection under the care of Dr. Jenne Pane with positive resection margin. 2. Status post course of concurrent chemoradiation with weekly cisplatin. Last dose of chemotherapy was given January 08, 2007. 3. Status post curative radiotherapy with tomotherapy to the lung lesions completed December 31, 2009 under the care of Dr. Michell Heinrich.  CURRENT THERAPY: Observation.  INTERVAL HISTORY: [name] 72 y.o. male returns to the clinic today for six-month followup visit accompanied by his wife. The patient is feeling fine today with no specific complaints. He denied having any significant chest pain, shortness breath, cough or hemoptysis. He has no weight loss or night sweats. He has repeat CT scan of the chest, abdomen and pelvis performed recently and he is here for evaluation and discussion of his scan results.  MEDICAL HISTORY: Past Medical History  Diagnosis Date  . Cancer     SUPERGLOTTIS INVASIVE SQ CELL CA  . Hypertension     ALLERGIES:  has No Known Allergies.  MEDICATIONS:  Current Outpatient Prescriptions  Medication Sig Dispense Refill  . baclofen (LIORESAL) 10 MG tablet Take 10 mg by mouth Daily.      . finasteride (PROSCAR) 5 MG tablet Take 5 mg by mouth Daily.      Marland Kitchen LORazepam (ATIVAN) 0.5 MG tablet Take 0.5 mg by mouth Three times a day.      . metoprolol succinate (TOPROL-XL) 25 MG 24 hr tablet Take 25 mg by mouth Daily.      . simvastatin (ZOCOR) 20 MG tablet Take 20 mg by mouth  Daily.      Marland Kitchen amLODipine (NORVASC) 5 MG tablet 5 mg daily.       No current facility-administered medications for this visit.    REVIEW OF SYSTEMS:  A comprehensive review of systems was negative.   PHYSICAL EXAMINATION: General appearance: alert, cooperative and no distress Head: Normocephalic, without obvious abnormality, atraumatic Neck: no adenopathy Lymph nodes: Cervical, supraclavicular, and axillary nodes normal. Resp: clear to auscultation bilaterally Cardio: regular rate and rhythm, S1, S2 normal, no murmur, click, rub or gallop GI: soft, non-tender; bowel sounds normal; no masses,  no organomegaly Extremities: extremities normal, atraumatic, no cyanosis or edema  ECOG PERFORMANCE STATUS: 1 - Symptomatic but completely ambulatory  Blood pressure 180/97, pulse 102, temperature 97.5 F (36.4 C), temperature source Oral, resp. rate 20, height 5\' 5"  (1.651 m), weight 193 lb 1.6 oz (87.59 kg).  LABORATORY DATA: Lab Results  Component Value Date   WBC 7.4 08/02/2012   HGB 15.4 08/02/2012   HCT 47.1 08/02/2012   MCV 83.4 08/02/2012   PLT 211 08/02/2012      Chemistry      Component Value Date/Time   NA 138 08/02/2012 1107   NA 139 01/19/2012 0942   NA 141 03/31/2010 1005   K 3.8 08/02/2012 1107   K 4.1 01/19/2012 0942   K 3.7 03/31/2010 1005   CL 103 08/02/2012 1107   CL 96* 01/19/2012 0942   CL 105 03/31/2010 1005   CO2 25 08/02/2012  1107   CO2 28 01/19/2012 0942   CO2 31 03/31/2010 1005   BUN 12.8 08/02/2012 1107   BUN 14 01/19/2012 0942   BUN 12 03/31/2010 1005   CREATININE 1.1 08/02/2012 1107   CREATININE 1.2 01/19/2012 0942   CREATININE 1.02 03/31/2010 1005      Component Value Date/Time   CALCIUM 9.4 08/02/2012 1107   CALCIUM 9.5 01/19/2012 0942   CALCIUM 9.0 03/31/2010 1005   ALKPHOS 89 08/02/2012 1107   ALKPHOS 64 01/19/2012 0942   ALKPHOS 60 03/31/2010 1005   AST 17 08/02/2012 1107   AST 27 01/19/2012 0942   AST 21 03/31/2010 1005   ALT 16 08/02/2012 1107   ALT 19  03/31/2010 1005   BILITOT 0.38 08/02/2012 1107   BILITOT 0.50 01/19/2012 0942   BILITOT 0.4 03/31/2010 1005       RADIOGRAPHIC STUDIES: Ct Chest W Contrast  08/02/2012  *RADIOLOGY REPORT*  Clinical Data:  Lung cancer.  Laryngeal cancer.  CT CHEST, ABDOMEN AND PELVIS WITH CONTRAST  Technique:  Multidetector CT imaging of the chest, abdomen and pelvis was performed following the standard protocol during bolus administration of intravenous contrast.  Contrast: OMNIPAQUE IOHEXOL 300 MG/ML  SOLN  Comparison:  CT 01/19/2012.   CT CHEST  Findings:  The chest wall is unremarkable and stable.  No supraclavicular or axillary lymphadenopathy.  The tracheostomy tube appears stable.  The bony thorax is intact.  No destructive bone lesions or spinal canal compromise.  The heart is upper limits of normal in size but no pericardial effusion or lymphadenopathy. The esophagus is mildly patulous but no wall thickening or mass.  No hiatal hernia.  The thoracic aorta demonstrates stable atherosclerotic changes and tortuosity but no focal aneurysm or dissection. Extensive coronary artery calcifications are noted.  Examination of the lung parenchyma demonstrates stable right upper lobe scarring changes.  The left upper lobe pulmonary nodule is unchanged measuring 4 mm.  No new pulmonary nodules.  Stable mild vascular congestion and areas of subpleural atelectasis.  No pleural effusion.  IMPRESSION:  1.  Stable CT appearance of the chest.  No adenopathy or mass. 2.  Stable 4 mm left upper lobe pulmonary nodule.   CT ABDOMEN AND PELVIS  Findings:  There are numerous hepatic metastatic lesions not significantly changed when compared to the prior examination.  No significant change in size since the most recent prior CT scan but these have slightly enlarged since July 2012.  Small cysts are also noted.  The gallbladder is normal.  No common bile duct dilatation.  The pancreas is normal.  The spleen is normal.  The adrenal  glands and kidneys are unremarkable and unchanged.  A lower pole left renal cyst is stable.  The stomach, duodenum, small bowel and colon are unremarkable.  No inflammatory changes or mass lesions.  The appendix is normal.  No mesenteric or retroperitoneal masses or lymphadenopathy.  Small scattered lymph nodes are stable.  The aorta demonstrates stable focal infrarenal aneurysmal dilatation just above the iliac artery bifurcation.  This measures 3.6 x 4.1 cm and is unchanged.  No dissection.  Dense iliac artery calcifications but no aneurysm. There are also dense calcifications at the other major aortic branch vessel ostia.  The bladder, prostate gland, seminal vesicles and rectum are unremarkable.  No pelvic mass, adenopathy or free pelvic fluid collections.  No inguinal mass or hernia.  The bony structures are unremarkable.  No destructive bone lesions or spinal canal compromise.  IMPRESSION:  1.  Stable hepatic lesions when compared to the most prior recent CT scan. 2.  No abdominal/pelvic lymphadenopathy or mass. 3.  Stable abdominal aortic aneurysm.   Original Report Authenticated By: Rudie Meyer, M.D.    ASSESSMENT: This is a very pleasant 72 years old African American male with history of stage IVC supraglottic invasive squamous cell carcinoma diagnosed in May of 2008 in addition to a stage IA non-small cell lung cancer diagnosed in June of 2011 status post resection as well as concurrent chemoradiation for the laryngeal carcinoma and curative radiotherapy to the lung lesion. The patient has been observation since July of 2011 with no evidence for disease recurrence.  PLAN: I discussed the scan results with the patient and his wife. I recommended for him to continue on observation for now with repeat CT scan of the chest, abdomen and pelvis in one year.  He was advised to call me immediately if he has any concerning symptoms in the interval the  All questions were answered. The patient knows to call  the clinic with any problems, questions or concerns. We can certainly see the patient much sooner if necessary.

## 2012-08-06 NOTE — Patient Instructions (Signed)
No evidence for disease recurrence on the recent scan. Followup in one year with repeat CT scan of the chest, abdomen and pelvis. Please call if you have any concerning symptoms in the interval.

## 2013-06-16 ENCOUNTER — Telehealth: Payer: Self-pay | Admitting: Internal Medicine

## 2013-06-16 NOTE — Telephone Encounter (Signed)
pts wife came by and picked up barium for CT scheduled on 02/25 shh

## 2013-08-06 ENCOUNTER — Other Ambulatory Visit (HOSPITAL_BASED_OUTPATIENT_CLINIC_OR_DEPARTMENT_OTHER): Payer: Medicare Other

## 2013-08-06 ENCOUNTER — Encounter (HOSPITAL_COMMUNITY): Payer: Self-pay

## 2013-08-06 ENCOUNTER — Ambulatory Visit (HOSPITAL_COMMUNITY)
Admission: RE | Admit: 2013-08-06 | Discharge: 2013-08-06 | Disposition: A | Discharge status: Home / Self Care | Payer: Medicare Other | Source: Ambulatory Visit | Attending: Internal Medicine | Admitting: Internal Medicine

## 2013-08-06 DIAGNOSIS — I700 Atherosclerosis of aorta: Secondary | ICD-10-CM | POA: Insufficient documentation

## 2013-08-06 DIAGNOSIS — I2510 Atherosclerotic heart disease of native coronary artery without angina pectoris: Secondary | ICD-10-CM | POA: Insufficient documentation

## 2013-08-06 DIAGNOSIS — C329 Malignant neoplasm of larynx, unspecified: Secondary | ICD-10-CM

## 2013-08-06 DIAGNOSIS — Z930 Tracheostomy status: Secondary | ICD-10-CM | POA: Insufficient documentation

## 2013-08-06 DIAGNOSIS — C787 Secondary malignant neoplasm of liver and intrahepatic bile duct: Secondary | ICD-10-CM | POA: Insufficient documentation

## 2013-08-06 DIAGNOSIS — C3490 Malignant neoplasm of unspecified part of unspecified bronchus or lung: Secondary | ICD-10-CM

## 2013-08-06 DIAGNOSIS — C3410 Malignant neoplasm of upper lobe, unspecified bronchus or lung: Secondary | ICD-10-CM

## 2013-08-06 DIAGNOSIS — C321 Malignant neoplasm of supraglottis: Secondary | ICD-10-CM

## 2013-08-06 DIAGNOSIS — J984 Other disorders of lung: Secondary | ICD-10-CM | POA: Insufficient documentation

## 2013-08-06 DIAGNOSIS — N281 Cyst of kidney, acquired: Secondary | ICD-10-CM | POA: Insufficient documentation

## 2013-08-06 DIAGNOSIS — K7689 Other specified diseases of liver: Secondary | ICD-10-CM | POA: Insufficient documentation

## 2013-08-06 DIAGNOSIS — R911 Solitary pulmonary nodule: Secondary | ICD-10-CM | POA: Insufficient documentation

## 2013-08-06 DIAGNOSIS — I714 Abdominal aortic aneurysm, without rupture, unspecified: Secondary | ICD-10-CM | POA: Insufficient documentation

## 2013-08-06 LAB — CBC WITH DIFFERENTIAL/PLATELET
BASO%: 0.8 % (ref 0.0–2.0)
BASOS ABS: 0.1 10*3/uL (ref 0.0–0.1)
EOS ABS: 0.1 10*3/uL (ref 0.0–0.5)
EOS%: 1.9 % (ref 0.0–7.0)
HEMATOCRIT: 46.9 % (ref 38.4–49.9)
HEMOGLOBIN: 15.5 g/dL (ref 13.0–17.1)
LYMPH#: 1.2 10*3/uL (ref 0.9–3.3)
LYMPH%: 18.6 % (ref 14.0–49.0)
MCH: 27.2 pg (ref 27.2–33.4)
MCHC: 33 g/dL (ref 32.0–36.0)
MCV: 82.5 fL (ref 79.3–98.0)
MONO#: 0.5 10*3/uL (ref 0.1–0.9)
MONO%: 7.4 % (ref 0.0–14.0)
NEUT%: 71.3 % (ref 39.0–75.0)
NEUTROS ABS: 4.7 10*3/uL (ref 1.5–6.5)
Platelets: 200 10*3/uL (ref 140–400)
RBC: 5.69 10*6/uL (ref 4.20–5.82)
RDW: 15.7 % — AB (ref 11.0–14.6)
WBC: 6.6 10*3/uL (ref 4.0–10.3)

## 2013-08-06 LAB — COMPREHENSIVE METABOLIC PANEL (CC13)
ALBUMIN: 3.9 g/dL (ref 3.5–5.0)
ALT: 18 U/L (ref 0–55)
ANION GAP: 8 meq/L (ref 3–11)
AST: 21 U/L (ref 5–34)
Alkaline Phosphatase: 86 U/L (ref 40–150)
BUN: 15.1 mg/dL (ref 7.0–26.0)
CALCIUM: 9.5 mg/dL (ref 8.4–10.4)
CHLORIDE: 106 meq/L (ref 98–109)
CO2: 28 meq/L (ref 22–29)
CREATININE: 1.1 mg/dL (ref 0.7–1.3)
GLUCOSE: 115 mg/dL (ref 70–140)
POTASSIUM: 4 meq/L (ref 3.5–5.1)
Sodium: 142 mEq/L (ref 136–145)
Total Bilirubin: 0.33 mg/dL (ref 0.20–1.20)
Total Protein: 7.5 g/dL (ref 6.4–8.3)

## 2013-08-06 MED ORDER — IOHEXOL 300 MG/ML  SOLN
100.0000 mL | Freq: Once | INTRAMUSCULAR | Status: AC | PRN
  Administered 2013-08-06: 100 mL via INTRAVENOUS

## 2013-08-13 ENCOUNTER — Telehealth: Payer: Self-pay | Admitting: Internal Medicine

## 2013-08-13 NOTE — Telephone Encounter (Signed)
returned pt call and advised on March appt....pt ok and aware

## 2013-09-04 ENCOUNTER — Encounter: Payer: Self-pay | Admitting: Internal Medicine

## 2013-09-04 ENCOUNTER — Ambulatory Visit (HOSPITAL_BASED_OUTPATIENT_CLINIC_OR_DEPARTMENT_OTHER): Payer: Medicare Other | Admitting: Internal Medicine

## 2013-09-04 VITALS — BP 168/97 | HR 96 | Temp 98.1°F | Resp 19 | Ht 65.0 in | Wt 189.0 lb

## 2013-09-04 DIAGNOSIS — C329 Malignant neoplasm of larynx, unspecified: Secondary | ICD-10-CM

## 2013-09-04 DIAGNOSIS — C3410 Malignant neoplasm of upper lobe, unspecified bronchus or lung: Secondary | ICD-10-CM

## 2013-09-04 DIAGNOSIS — C321 Malignant neoplasm of supraglottis: Secondary | ICD-10-CM

## 2013-09-04 DIAGNOSIS — C3490 Malignant neoplasm of unspecified part of unspecified bronchus or lung: Secondary | ICD-10-CM

## 2013-09-04 DIAGNOSIS — K769 Liver disease, unspecified: Secondary | ICD-10-CM

## 2013-09-04 DIAGNOSIS — K7689 Other specified diseases of liver: Secondary | ICD-10-CM

## 2013-09-04 NOTE — Progress Notes (Signed)
[facility] [phone]) [phone]   Fax:(336) [phone]  OFFICE PROGRESS NOTE  Salena Saner., MD [address] [place] [address]  PRINCIPAL DIAGNOSES:  1. 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 2. Stage IA non-small-cell lung cancer diagnosed in June 2011.  PRIOR THERAPY:  1. Status post total laryngectomy with. Bilateral selective lymph node dissection as well as left superior parotidectomy with nerve dissection under the care of Dr. Redmond Baseman with positive resection margin. 2. Status post course of concurrent chemoradiation with weekly cisplatin. Last dose of chemotherapy was given January 08, 2007. 3. Status post curative radiotherapy with tomotherapy to the lung lesions completed December 31, 2009 under the care of Dr. Pablo Ledger.  CURRENT THERAPY: Observation.  INTERVAL HISTORY: [name] 73 y.o. male returns to the clinic today for annual followup visit accompanied by his wife. The patient is feeling fine today with no specific complaints. He denied having any significant chest pain, shortness breath, cough or hemoptysis. He has no weight loss or night sweats. He has repeat CT scan of the chest, abdomen and pelvis performed recently and he is here for evaluation and discussion of his scan results.  MEDICAL HISTORY: Past Medical History  Diagnosis Date  . Cancer     SUPERGLOTTIS INVASIVE SQ CELL CA  . Hypertension     ALLERGIES:  has No Known Allergies.  MEDICATIONS:  Current Outpatient Prescriptions  Medication Sig Dispense Refill  . amLODipine (NORVASC) 5 MG tablet 5 mg daily.      Marland Kitchen aspirin 81 MG tablet Take 81 mg by mouth daily.      . baclofen (LIORESAL) 10 MG tablet Take 10 mg by mouth Daily.      . cholecalciferol (VITAMIN D) 1000 UNITS tablet Take 1,000 Units by mouth daily.      . finasteride (PROSCAR) 5 MG tablet Take 5 mg by mouth Daily.      . metoprolol succinate  (TOPROL-XL) 25 MG 24 hr tablet Take 25 mg by mouth Daily.      . simvastatin (ZOCOR) 20 MG tablet Take 20 mg by mouth Daily.       No current facility-administered medications for this visit.    REVIEW OF SYSTEMS:  A comprehensive review of systems was negative.   PHYSICAL EXAMINATION: General appearance: alert, cooperative and no distress Head: Normocephalic, without obvious abnormality, atraumatic Neck: no adenopathy Lymph nodes: Cervical, supraclavicular, and axillary nodes normal. Resp: clear to auscultation bilaterally Cardio: regular rate and rhythm, S1, S2 normal, no murmur, click, rub or gallop GI: soft, non-tender; bowel sounds normal; no masses,  no organomegaly Extremities: extremities normal, atraumatic, no cyanosis or edema  ECOG PERFORMANCE STATUS: 1 - Symptomatic but completely ambulatory  Blood pressure 168/97, pulse 96, temperature 98.1 F (36.7 C), temperature source Oral, resp. rate 19, height 5\' 5"  (1.651 m), weight 189 lb (85.73 kg), SpO2 97.00%.  LABORATORY DATA: Lab Results  Component Value Date   WBC 6.6 08/06/2013   HGB 15.5 08/06/2013   HCT 46.9 08/06/2013   MCV 82.5 08/06/2013   PLT 200 08/06/2013      Chemistry      Component Value Date/Time   NA 142 08/06/2013 0933   NA 139 01/19/2012 0942   NA 141 03/31/2010 1005   K 4.0 08/06/2013 0933   K 4.1 01/19/2012 0942   K 3.7 03/31/2010 1005   CL 103 08/02/2012 1107   CL  96* 01/19/2012 0942   CL 105 03/31/2010 1005   CO2 28 08/06/2013 0933   CO2 28 01/19/2012 0942   CO2 31 03/31/2010 1005   BUN 15.1 08/06/2013 0933   BUN 14 01/19/2012 0942   BUN 12 03/31/2010 1005   CREATININE 1.1 08/06/2013 0933   CREATININE 1.2 01/19/2012 0942   CREATININE 1.02 03/31/2010 1005      Component Value Date/Time   CALCIUM 9.5 08/06/2013 0933   CALCIUM 9.5 01/19/2012 0942   CALCIUM 9.0 03/31/2010 1005   ALKPHOS 86 08/06/2013 0933   ALKPHOS 64 01/19/2012 0942   ALKPHOS 60 03/31/2010 1005   AST 21 08/06/2013 0933   AST 27 01/19/2012 0942    AST 21 03/31/2010 1005   ALT 18 08/06/2013 0933   ALT 23 01/19/2012 0942   ALT 19 03/31/2010 1005   BILITOT 0.33 08/06/2013 0933   BILITOT 0.50 01/19/2012 0942   BILITOT 0.4 03/31/2010 1005       RADIOGRAPHIC STUDIES: Ct Chest W Contrast  08/06/2013   CLINICAL DATA:  Laryngeal and lung cancer. Squamous cell carcinoma of supraglottic larynx diagnosed in 2008. Stage IA non-small cell lung cancer diagnosed in 2011. Radiation therapy to the lungs 2011.  EXAM: CT CHEST, ABDOMEN, AND PELVIS WITH CONTRAST  TECHNIQUE: Multidetector CT imaging of the chest, abdomen and pelvis was performed following the standard protocol during bolus administration of intravenous contrast.  CONTRAST:  163mL OMNIPAQUE IOHEXOL 300 MG/ML  SOLN  COMPARISON:  CT ABD/PELVIS W CM dated 08/02/2012  FINDINGS:   CT CHEST FINDINGS  Lungs/Pleura: Tracheostomy, appropriately positioned.  Similar right upper lobe scarring.  Mild left base scarring as well.  Posterior left upper lobe nodule is unchanged at 4 mm on image 12. No new or enlarging nodules are identified. No pleural fluid.  Heart/Mediastinum: No supraclavicular adenopathy. Aortic and branch vessel atherosclerosis. Heart size upper normal. Multivessel coronary artery atherosclerosis. No central pulmonary embolism, on this non-dedicated study. No mediastinal or hilar adenopathy.  The esophagus is similarly dilated.  No cause identified.    CT ABDOMEN AND PELVIS FINDINGS  Abdomen/Pelvis: Multiple ill-defined hepatic lesions which are most consistent with metastatic disease.  Index anterior segment right liver lobe lesion measures 3.0 cm on image 44 versus 2.3 cm on the prior exam (when remeasured).  Index posterior segment right liver lobe lesion measures 3.2 cm on image 54 versus 2.7 cm on the prior  A right liver lobe pericholecystic liver lesion measures 3.0 cm on image 55 versus 2.4 cm on the prior  Smaller volume but definite left hepatic lobe metastasis as well.  Normal spleen,  stomach. A cystic focus within the pancreatic tail measures 4 mm and may represent minimal side branch duct ectasia on image 47. Normal gallbladder, biliary tract, and adrenal glands. Lower pole left renal cyst of 14 mm. Too small to characterize right renal lesions. Advanced aortic and branch vessel atherosclerosis. Infrarenal abdominal aortic aneurysm which measures maximally 4.0 x 4.3 cm versus 4.1 x 3.6 cm on the prior. No extension into the iliacs or surrounding hemorrhage. No retroperitoneal or retrocrural adenopathy. Moderate amount of stool within the rectum. Scattered colonic diverticula. Normal terminal ileum and appendix. Normal small bowel without abdominal ascites.  No pelvic adenopathy. Normal urinary bladder and prostate. No significant free fluid.  Bones/Musculoskeletal: Remote trauma of lateral left ribs. Degenerative disc disease at L4-5.    IMPRESSION: CT CHEST IMPRESSION  1.  No acute process or evidence of metastatic disease in the chest. 2.  Similar 4 mm posterior left upper lobe lung nodule.  CT ABDOMEN AND PELVIS IMPRESSION  1. Mild progression of hepatic metastasis since 08/02/2012. 2. No evidence of extrahepatic metastatic disease. 3. Similar to slight increase in abdominal aortic aneurysm. 4. Suspicion of constipation or even mild fecal impaction.   Electronically Signed   By: Abigail Miyamoto M.D.   On: 08/06/2013 12:45     ASSESSMENT AND PLAN: This is a very pleasant 73 years old African American male with history of stage IVC supraglottic invasive squamous cell carcinoma diagnosed in May of 2008 in addition to a stage IA non-small cell lung cancer diagnosed in June of 2011 status post resection as well as concurrent chemoradiation for the laryngeal carcinoma and curative radiotherapy to the lung lesion. The patient has been observation since July of 2011 with no evidence for disease recurrence.  I discussed the scan results with the patient and his wife. It showed no evidence for  disease progression except for slight increase in the liver lesions which were biopsy-proven to be benign lesions. I recommended for him to continue on observation for now with repeat CT scan of the chest, abdomen and pelvis in one year.  He was advised to call me immediately if he has any concerning symptoms in the interval the  All questions were answered. The patient knows to call the clinic with any problems, questions or concerns. We can certainly see the patient much sooner if necessary.  Disclaimer: This note was dictated with voice recognition software. Similar sounding words can inadvertently be transcribed and may not be corrected upon review.

## 2013-09-05 ENCOUNTER — Telehealth: Payer: Self-pay | Admitting: Internal Medicine

## 2013-09-05 NOTE — Telephone Encounter (Signed)
s.w. pt and advised on March 2016 apppt....ok and aware

## 2014-06-17 ENCOUNTER — Telehealth: Payer: Self-pay | Admitting: Internal Medicine

## 2014-06-17 NOTE — Telephone Encounter (Signed)
returned wife call and lvm with appts.Marland KitchenMarland KitchenMarland Kitchen

## 2014-09-01 ENCOUNTER — Ambulatory Visit (HOSPITAL_COMMUNITY): Payer: Medicare Other

## 2014-09-01 ENCOUNTER — Other Ambulatory Visit: Payer: Medicare Other

## 2014-09-08 ENCOUNTER — Ambulatory Visit: Payer: Medicare Other | Admitting: Internal Medicine

## 2014-09-29 ENCOUNTER — Encounter (HOSPITAL_COMMUNITY): Payer: Self-pay

## 2014-09-29 ENCOUNTER — Ambulatory Visit (HOSPITAL_COMMUNITY)
Admission: RE | Admit: 2014-09-29 | Discharge: 2014-09-29 | Disposition: A | Discharge status: Home / Self Care | Payer: Medicare Other | Source: Ambulatory Visit | Attending: Internal Medicine | Admitting: Internal Medicine

## 2014-09-29 ENCOUNTER — Other Ambulatory Visit: Payer: Self-pay | Admitting: *Deleted

## 2014-09-29 ENCOUNTER — Other Ambulatory Visit (HOSPITAL_BASED_OUTPATIENT_CLINIC_OR_DEPARTMENT_OTHER): Payer: Medicare Other

## 2014-09-29 DIAGNOSIS — R911 Solitary pulmonary nodule: Secondary | ICD-10-CM | POA: Insufficient documentation

## 2014-09-29 DIAGNOSIS — C9000 Multiple myeloma not having achieved remission: Secondary | ICD-10-CM | POA: Diagnosis not present

## 2014-09-29 DIAGNOSIS — K769 Liver disease, unspecified: Secondary | ICD-10-CM

## 2014-09-29 DIAGNOSIS — C329 Malignant neoplasm of larynx, unspecified: Secondary | ICD-10-CM

## 2014-09-29 DIAGNOSIS — I2510 Atherosclerotic heart disease of native coronary artery without angina pectoris: Secondary | ICD-10-CM | POA: Diagnosis not present

## 2014-09-29 DIAGNOSIS — Z8521 Personal history of malignant neoplasm of larynx: Secondary | ICD-10-CM | POA: Diagnosis not present

## 2014-09-29 DIAGNOSIS — C3490 Malignant neoplasm of unspecified part of unspecified bronchus or lung: Secondary | ICD-10-CM

## 2014-09-29 DIAGNOSIS — I714 Abdominal aortic aneurysm, without rupture: Secondary | ICD-10-CM | POA: Insufficient documentation

## 2014-09-29 LAB — CBC WITH DIFFERENTIAL/PLATELET
BASO%: 0.9 % (ref 0.0–2.0)
BASOS ABS: 0.1 10*3/uL (ref 0.0–0.1)
EOS%: 0.8 % (ref 0.0–7.0)
Eosinophils Absolute: 0.1 10*3/uL (ref 0.0–0.5)
HEMATOCRIT: 43.7 % (ref 38.4–49.9)
HEMOGLOBIN: 14.1 g/dL (ref 13.0–17.1)
LYMPH%: 11.9 % — ABNORMAL LOW (ref 14.0–49.0)
MCH: 25.5 pg — AB (ref 27.2–33.4)
MCHC: 32.3 g/dL (ref 32.0–36.0)
MCV: 78.9 fL — AB (ref 79.3–98.0)
MONO#: 0.5 10*3/uL (ref 0.1–0.9)
MONO%: 7.4 % (ref 0.0–14.0)
NEUT#: 5.3 10*3/uL (ref 1.5–6.5)
NEUT%: 79 % — AB (ref 39.0–75.0)
Platelets: 213 10*3/uL (ref 140–400)
RBC: 5.54 10*6/uL (ref 4.20–5.82)
RDW: 16.6 % — AB (ref 11.0–14.6)
WBC: 6.8 10*3/uL (ref 4.0–10.3)
lymph#: 0.8 10*3/uL — ABNORMAL LOW (ref 0.9–3.3)

## 2014-09-29 LAB — COMPREHENSIVE METABOLIC PANEL (CC13)
ALT: 18 U/L (ref 0–55)
AST: 27 U/L (ref 5–34)
Albumin: 3.8 g/dL (ref 3.5–5.0)
Alkaline Phosphatase: 100 U/L (ref 40–150)
Anion Gap: 15 mEq/L — ABNORMAL HIGH (ref 3–11)
BUN: 16 mg/dL (ref 7.0–26.0)
CALCIUM: 9.4 mg/dL (ref 8.4–10.4)
CHLORIDE: 103 meq/L (ref 98–109)
CO2: 22 mEq/L (ref 22–29)
CREATININE: 1 mg/dL (ref 0.7–1.3)
EGFR: 87 mL/min/{1.73_m2} — ABNORMAL LOW (ref >90)
Glucose: 117 mg/dl (ref 70–140)
Potassium: 4.2 mEq/L (ref 3.5–5.1)
Sodium: 139 mEq/L (ref 136–145)
Total Bilirubin: 0.39 mg/dL (ref 0.20–1.20)
Total Protein: 7.6 g/dL (ref 6.4–8.3)

## 2014-09-29 MED ORDER — IOHEXOL 300 MG/ML  SOLN
100.0000 mL | Freq: Once | INTRAMUSCULAR | Status: AC | PRN
  Administered 2014-09-29: 100 mL via INTRAVENOUS

## 2014-10-06 ENCOUNTER — Ambulatory Visit (HOSPITAL_BASED_OUTPATIENT_CLINIC_OR_DEPARTMENT_OTHER): Payer: Medicare Other | Admitting: Internal Medicine

## 2014-10-06 ENCOUNTER — Telehealth: Payer: Self-pay | Admitting: Internal Medicine

## 2014-10-06 ENCOUNTER — Encounter: Payer: Self-pay | Admitting: Internal Medicine

## 2014-10-06 VITALS — BP 153/74 | HR 108 | Temp 98.2°F | Resp 20 | Ht 65.0 in | Wt 181.9 lb

## 2014-10-06 DIAGNOSIS — Z8521 Personal history of malignant neoplasm of larynx: Secondary | ICD-10-CM

## 2014-10-06 DIAGNOSIS — C3412 Malignant neoplasm of upper lobe, left bronchus or lung: Secondary | ICD-10-CM | POA: Diagnosis not present

## 2014-10-06 DIAGNOSIS — K7689 Other specified diseases of liver: Secondary | ICD-10-CM | POA: Diagnosis not present

## 2014-10-06 DIAGNOSIS — C329 Malignant neoplasm of larynx, unspecified: Secondary | ICD-10-CM

## 2014-10-06 DIAGNOSIS — C3410 Malignant neoplasm of upper lobe, unspecified bronchus or lung: Secondary | ICD-10-CM

## 2014-10-06 DIAGNOSIS — K769 Liver disease, unspecified: Secondary | ICD-10-CM

## 2014-10-06 NOTE — Telephone Encounter (Signed)
Gave avs & calendar for October. Also gave contrast for scan.

## 2014-10-06 NOTE — Progress Notes (Signed)
[facility] Telephone:([phone]   Fax:(336) [phone]  OFFICE PROGRESS NOTE  Salena Saner., MD [place] [address]  PRINCIPAL DIAGNOSES:  1. 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 2. Stage IA non-small-cell lung cancer diagnosed in June 2011.  PRIOR THERAPY:  1. Status post total laryngectomy with. Bilateral selective lymph node dissection as well as left superior parotidectomy with nerve dissection under the care of Dr. Redmond Baseman with positive resection margin. 2. Status post course of concurrent chemoradiation with weekly cisplatin. Last dose of chemotherapy was given January 08, 2007. 3. Status post curative radiotherapy with tomotherapy to the lung lesions completed December 31, 2009 under the care of Dr. Pablo Ledger.  CURRENT THERAPY: Observation.  INTERVAL HISTORY: [name] 74 y.o. male returns to the clinic today for annual followup visit accompanied by his wife. The patient has no significant change since his last visit a year ago. The patient is feeling fine today with no specific complaints. He denied having any significant chest pain, shortness of breath, cough or hemoptysis. He has no weight loss or night sweats. He has repeat CT scan of the chest, abdomen and pelvis performed recently and he is here for evaluation and discussion of his scan results.  MEDICAL HISTORY: Past Medical History  Diagnosis Date  . Hypertension   . Cancer     SUPERGLOTTIS INVASIVE SQ CELL CA    ALLERGIES:  has No Known Allergies.  MEDICATIONS:  Current Outpatient Prescriptions  Medication Sig Dispense Refill  . amLODipine (NORVASC) 5 MG tablet 5 mg daily.    Marland Kitchen aspirin 81 MG tablet Take 81 mg by mouth daily.    . baclofen (LIORESAL) 10 MG tablet Take 10 mg by mouth Daily.    . cholecalciferol (VITAMIN D) 1000 UNITS tablet Take 1,000 Units by mouth daily.    . finasteride (PROSCAR) 5 MG  tablet Take 5 mg by mouth Daily.    . metoprolol succinate (TOPROL-XL) 25 MG 24 hr tablet Take 25 mg by mouth Daily.    . simvastatin (ZOCOR) 20 MG tablet Take 20 mg by mouth Daily.     No current facility-administered medications for this visit.    REVIEW OF SYSTEMS:  A comprehensive review of systems was negative.   PHYSICAL EXAMINATION: General appearance: alert, cooperative and no distress Head: Normocephalic, without obvious abnormality, atraumatic Neck: no adenopathy Lymph nodes: Cervical, supraclavicular, and axillary nodes normal. Resp: clear to auscultation bilaterally Cardio: regular rate and rhythm, S1, S2 normal, no murmur, click, rub or gallop GI: soft, non-tender; bowel sounds normal; no masses,  no organomegaly Extremities: extremities normal, atraumatic, no cyanosis or edema  ECOG PERFORMANCE STATUS: 1 - Symptomatic but completely ambulatory  Blood pressure 153/74, pulse 108, temperature 98.2 F (36.8 C), temperature source Oral, resp. rate 20, height '5\' 5"'$  (1.651 m), weight 181 lb 14.4 oz (82.509 kg), SpO2 97 %.  LABORATORY DATA: Lab Results  Component Value Date   WBC 6.8 09/29/2014   HGB 14.1 09/29/2014   HCT 43.7 09/29/2014   MCV 78.9* 09/29/2014   PLT 213 09/29/2014      Chemistry      Component Value Date/Time   NA 139 09/29/2014 0940   NA 139 01/19/2012 0942   NA 141 03/31/2010 1005   K 4.2 09/29/2014 0940   K 4.1 01/19/2012 0942   K 3.7 03/31/2010 1005   CL 103 08/02/2012 1107  CL 96* 01/19/2012 0942   CL 105 03/31/2010 1005   CO2 22 09/29/2014 0940   CO2 28 01/19/2012 0942   CO2 31 03/31/2010 1005   BUN 16.0 09/29/2014 0940   BUN 14 01/19/2012 0942   BUN 12 03/31/2010 1005   CREATININE 1.0 09/29/2014 0940   CREATININE 1.2 01/19/2012 0942   CREATININE 1.02 03/31/2010 1005      Component Value Date/Time   CALCIUM 9.4 09/29/2014 0940   CALCIUM 9.5 01/19/2012 0942   CALCIUM 9.0 03/31/2010 1005   ALKPHOS 100 09/29/2014 0940   ALKPHOS 64  01/19/2012 0942   ALKPHOS 60 03/31/2010 1005   AST 27 09/29/2014 0940   AST 27 01/19/2012 0942   AST 21 03/31/2010 1005   ALT 18 09/29/2014 0940   ALT 23 01/19/2012 0942   ALT 19 03/31/2010 1005   BILITOT 0.39 09/29/2014 0940   BILITOT 0.50 01/19/2012 0942   BILITOT 0.4 03/31/2010 1005       RADIOGRAPHIC STUDIES: Ct Chest W Contrast  09/29/2014   CLINICAL DATA:  Followup small cell lung cancer. History of laryngeal carcinoma 2008 status post laryngectomy and bilateral lymph node dissection.  EXAM: CT CHEST, ABDOMEN, AND PELVIS WITH CONTRAST  TECHNIQUE: Multidetector CT imaging of the chest, abdomen and pelvis was performed following the standard protocol during bolus administration of intravenous contrast.  CONTRAST:  182m OMNIPAQUE IOHEXOL 300 MG/ML  SOLN  COMPARISON:  08/06/2013  FINDINGS: CT CHEST FINDINGS  Mediastinum: Normal heart size. No pericardial effusion. The patient has a tracheostomy tube. 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 The esophagus appears patulous and increased in diameter. No focal filling defects identified. Calcified atherosclerotic disease involves the thoracic aorta as well as the RCA, LAD and left circumflex coronary artery. No mediastinal or hilar adenopathy.  Lungs/Pleura: Pulmonary nodule within the left upper lobe measures 7 mm, image 12/series 4. Previous 3 mm. Focal area of ground-glass attenuation, architectural distortion and scarring within the right lung is again noted, likely reflecting post treatment changes.  Musculoskeletal: No aggressive lytic or sclerotic bone lesions.  CT ABDOMEN AND PELVIS FINDINGS  Upper Abdomen:  Normal appearance of the adrenal glands.  Hepatobiliary: Numerous liver lesions are identified. Index lesion in segment 7 measures 3.3 cm, image 44/series 2. Previously 3.0 cm. Segment 6 liver lesion measures 3.7 cm, image 54/ series 2. Previously 3.2 cm. Also in segment 6 is a 3.9 cm  partially exophytic lesion, image 57/ series 2. Previous 3.1 cm. The gallbladder appears normal. No biliary dilatation.  Pancreas: Continued growth of soft tissue involving the tip of the tail of pancreas with central area of calcification an cyst. On today's study this measures 3.5 cm, image 68 of series 601. Previously 1.5 cm.  Spleen: Normal.  Adrenals/Urinary Tract: Normal. Normal appearance of the right kidney. Cyst is noted within the inferior pole of the left kidney. The urinary bladder appears normal.  Stomach/Bowel: The stomach and the small bowel loops have a normal course and caliber without evidence for bowel obstruction. The appendix is visualized and appears normal. Scattered distal colonic diverticula are identified.  Vascular/Lymphatic: Atherosclerotic disease involves the abdominal aorta. There is aneurysmal dilatation of the infrarenal abdominal aorta which currently measures 3.9 cm, image 78/ series 2. Previously 3.8 cm. No enlarged retroperitoneal or mesenteric adenopathy. No enlarged pelvic or inguinal lymph nodes.  Reproductive: Prostate gland and seminal vesicles are unremarkable.  Other: There is no ascites or focal fluid collections  within the abdomen or pelvis.  Musculoskeletal: There is no aggressive lytic or sclerotic bone lesion identified. Degenerative disc disease is noted within the lower lumbar spine. Most advanced at L4-5.  IMPRESSION: 1. Interval progression of disease. Index lesions within the liver have increased in size from previous exam. 2. Small pulmonary nodule in left upper lobe has also increased in the interval. 3. Continued increase in size of tail of pancreas. This is worrisome for underlying malignancy. Likely metastasis. 4. Atherosclerotic disease including 3 vessel Coronary artery calcification an infrarenal abdominal aortic aneurysm. The aneurysm currently measures 3.9 cm in maximum AP dimension. Recommend followup by ultrasound in 2 years. This recommendation  follows ACR consensus guidelines: White Paper of the ACR Incidental Findings Committee II on Vascular Findings. J Am Coll Radiol 2013; 10:789-794.   Electronically Signed   By: Kerby Moors M.D.   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  EXAM: CT CHEST, ABDOMEN, AND PELVIS WITH CONTRAST  TECHNIQUE: Multidetector CT imaging of the chest, abdomen and pelvis was performed following the standard protocol during bolus administration of intravenous contrast.  CONTRAST:  156m OMNIPAQUE IOHEXOL 300 MG/ML  SOLN  COMPARISON:  08/06/2013  FINDINGS: CT CHEST FINDINGS  Mediastinum: Normal heart size. No pericardial effusion. The patient has a tracheostomy tube. 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 The esophagus appears patulous and increased in diameter. No focal filling defects identified. Calcified atherosclerotic disease involves the thoracic aorta as well as the RCA, LAD and left circumflex coronary artery. No mediastinal or hilar adenopathy.  Lungs/Pleura: Pulmonary nodule within the left upper lobe measures 7 mm, image 12/series 4. Previous 3 mm. Focal area of ground-glass attenuation, architectural distortion and scarring within the right lung is again noted, likely reflecting post treatment changes.  Musculoskeletal: No aggressive lytic or sclerotic bone lesions.  CT ABDOMEN AND PELVIS FINDINGS  Upper Abdomen:  Normal appearance of the adrenal glands.  Hepatobiliary: Numerous liver lesions are identified. Index lesion in segment 7 measures 3.3 cm, image 44/series 2. Previously 3.0 cm. Segment 6 liver lesion measures 3.7 cm, image 54/ series 2. Previously 3.2 cm. Also in segment 6 is a 3.9 cm partially exophytic lesion, image 57/ series 2. Previous 3.1 cm. The gallbladder appears normal. No  biliary dilatation.  Pancreas: Continued growth of soft tissue involving the tip of the tail of pancreas with central area of calcification an cyst. On today's study this measures 3.5 cm, image 68 of series 601. Previously 1.5 cm.  Spleen: Normal.  Adrenals/Urinary Tract: Normal. Normal appearance of the right kidney. Cyst is noted within the inferior pole of the left kidney. The urinary bladder appears normal.  Stomach/Bowel: The stomach and the small bowel loops have a normal course and caliber without evidence for bowel obstruction. The appendix is visualized and appears normal. Scattered distal colonic diverticula are identified.  Vascular/Lymphatic: Atherosclerotic disease involves the abdominal aorta. There is aneurysmal dilatation of the infrarenal abdominal aorta which currently measures 3.9 cm, image 78/ series 2. Previously 3.8 cm. No enlarged retroperitoneal or mesenteric adenopathy. No enlarged pelvic or inguinal lymph nodes.  Reproductive: Prostate gland and seminal vesicles are unremarkable.  Other: There is no ascites or focal fluid collections within the abdomen or pelvis.  Musculoskeletal: There is no aggressive lytic or sclerotic bone  lesion identified. Degenerative disc disease is noted within the lower lumbar spine. Most advanced at L4-5.  IMPRESSION: 1. Interval progression of disease. Index lesions within the liver have increased in size from previous exam. 2. Small pulmonary nodule in left upper lobe has also increased in the interval. 3. Continued increase in size of tail of pancreas. This is worrisome for underlying malignancy. Likely metastasis. 4. Atherosclerotic disease including 3 vessel Coronary artery calcification an infrarenal abdominal aortic aneurysm. The aneurysm currently measures 3.9 cm in maximum AP dimension. Recommend followup by ultrasound in 2 years. This recommendation follows ACR consensus guidelines: White Paper of the ACR Incidental Findings Committee II on Vascular  Findings. J Am Coll Radiol 2013; 10:789-794.   Electronically Signed   By: Kerby Moors M.D.   On: 09/29/2014 12:42   ASSESSMENT AND PLAN: This is a very pleasant 74 years old African American male with history of stage IVC supraglottic invasive squamous cell carcinoma diagnosed in May of 2008 in addition to a stage IA non-small cell lung cancer diagnosed in June of 2011 status post resection as well as concurrent chemoradiation for the laryngeal carcinoma and curative radiotherapy to the lung lesion. The patient has been observation since July of 2011 with no evidence for disease recurrence over the last few years. The recent CT scan of the chest showed interval progression of disease especially in the liver as well as small pulmonary nodules in the left upper lobe and continued increase in the size of the tail of the pancreas worrisome for underlying malignancy. I discussed the scan results with the patient and his wife. I recommended for him to have repeat biopsy of the enlarging liver lesion to rule out any underlying malignancy. The patient had previous biopsy in the past that showed no evidence of malignancy but this was few years ago. The patient and his wife agreed to the current plan. If he has no evidence for disease recurrence or malignancy on the biopsy, he would come back for follow-up visit in 6 months with repeat CT scan of the chest, abdomen and pelvis. He was advised to call me immediately if he has any concerning symptoms in the interval the  All questions were answered. The patient knows to call the clinic with any problems, questions or concerns. We can certainly see the patient much sooner if necessary.  Disclaimer: This note was dictated with voice recognition software. Similar sounding words can inadvertently be transcribed and may not be corrected upon review.

## 2014-10-14 ENCOUNTER — Other Ambulatory Visit: Payer: Self-pay | Admitting: Radiology

## 2014-10-15 ENCOUNTER — Ambulatory Visit (HOSPITAL_COMMUNITY)
Admission: RE | Admit: 2014-10-15 | Discharge: 2014-10-15 | Disposition: A | Discharge status: Home / Self Care | Payer: Medicare Other | Source: Ambulatory Visit | Attending: Internal Medicine | Admitting: Internal Medicine

## 2014-10-15 ENCOUNTER — Encounter (HOSPITAL_COMMUNITY): Payer: Self-pay

## 2014-10-15 ENCOUNTER — Other Ambulatory Visit: Payer: Self-pay | Admitting: Internal Medicine

## 2014-10-15 DIAGNOSIS — C329 Malignant neoplasm of larynx, unspecified: Secondary | ICD-10-CM

## 2014-10-15 DIAGNOSIS — K769 Liver disease, unspecified: Secondary | ICD-10-CM

## 2014-10-15 DIAGNOSIS — Z87891 Personal history of nicotine dependence: Secondary | ICD-10-CM | POA: Insufficient documentation

## 2014-10-15 DIAGNOSIS — C3410 Malignant neoplasm of upper lobe, unspecified bronchus or lung: Secondary | ICD-10-CM | POA: Diagnosis present

## 2014-10-15 DIAGNOSIS — K7689 Other specified diseases of liver: Secondary | ICD-10-CM | POA: Diagnosis not present

## 2014-10-15 HISTORY — DX: Cerebral infarction, unspecified: I63.9

## 2014-10-15 LAB — CBC WITH DIFFERENTIAL/PLATELET
Basophils Absolute: 0 10*3/uL (ref 0.0–0.1)
Basophils Relative: 0 % (ref 0–1)
Eosinophils Absolute: 0 10*3/uL (ref 0.0–0.7)
Eosinophils Relative: 1 % (ref 0–5)
HCT: 40.7 % (ref 39.0–52.0)
HEMOGLOBIN: 13.4 g/dL (ref 13.0–17.0)
LYMPHS ABS: 0.8 10*3/uL (ref 0.7–4.0)
LYMPHS PCT: 10 % — AB (ref 12–46)
MCH: 26.3 pg (ref 26.0–34.0)
MCHC: 32.9 g/dL (ref 30.0–36.0)
MCV: 79.8 fL (ref 78.0–100.0)
MONOS PCT: 9 % (ref 3–12)
Monocytes Absolute: 0.7 10*3/uL (ref 0.1–1.0)
Neutro Abs: 6.4 10*3/uL (ref 1.7–7.7)
Neutrophils Relative %: 80 % — ABNORMAL HIGH (ref 43–77)
PLATELETS: 214 10*3/uL (ref 150–400)
RBC: 5.1 MIL/uL (ref 4.22–5.81)
RDW: 15.7 % — ABNORMAL HIGH (ref 11.5–15.5)
WBC: 8 10*3/uL (ref 4.0–10.5)

## 2014-10-15 LAB — PROTIME-INR
INR: 1.08 (ref 0.00–1.49)
PROTHROMBIN TIME: 14.1 s (ref 11.6–15.2)

## 2014-10-15 LAB — APTT: aPTT: 41 seconds — ABNORMAL HIGH (ref 24–37)

## 2014-10-15 MED ORDER — FLUMAZENIL 0.5 MG/5ML IV SOLN
INTRAVENOUS | Status: AC
  Filled 2014-10-15: qty 5

## 2014-10-15 MED ORDER — FENTANYL CITRATE (PF) 100 MCG/2ML IJ SOLN
INTRAMUSCULAR | Status: AC | PRN
  Administered 2014-10-15 (×2): 25 ug via INTRAVENOUS

## 2014-10-15 MED ORDER — NALOXONE HCL 0.4 MG/ML IJ SOLN
INTRAMUSCULAR | Status: AC
  Filled 2014-10-15: qty 1

## 2014-10-15 MED ORDER — SODIUM CHLORIDE 0.9 % IV SOLN
INTRAVENOUS | Status: DC
  Administered 2014-10-15: 13:00:00 via INTRAVENOUS

## 2014-10-15 MED ORDER — FENTANYL CITRATE (PF) 100 MCG/2ML IJ SOLN
INTRAMUSCULAR | Status: AC
  Filled 2014-10-15: qty 2

## 2014-10-15 MED ORDER — MIDAZOLAM HCL 2 MG/2ML IJ SOLN
INTRAMUSCULAR | Status: AC
  Filled 2014-10-15: qty 4

## 2014-10-15 MED ORDER — MIDAZOLAM HCL 2 MG/2ML IJ SOLN
INTRAMUSCULAR | Status: AC | PRN
  Administered 2014-10-15 (×2): 0.5 mg via INTRAVENOUS

## 2014-10-15 NOTE — H&P (Signed)
Chief Complaint: Enlarging liver lesion  Referring Physician(s): Mohamed,Mohamed  History of Present Illness: [name] is a 74 y.o. 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 Hypertension   . 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 Tracheostomy  2009    Allergies: Review of patient's allergies indicates no known allergies.  Medications: Prior to Admission medications   Medication Sig Start Date End Date Taking? Authorizing Provider  amLODipine (NORVASC) 5 MG tablet Take 5 mg by mouth at bedtime.  07/01/12   Historical Provider, MD  aspirin 81 MG tablet Take 81 mg by mouth every morning.     Historical Provider, MD  baclofen (LIORESAL) 10 MG tablet Take 10 mg by mouth at bedtime.  12/11/11   Historical Provider, MD  cholecalciferol (VITAMIN D) 1000 UNITS tablet Take 1,000 Units by mouth every morning.     Historical Provider, MD  finasteride (PROSCAR) 5 MG tablet Take 5 mg by mouth every morning.  12/22/11   Historical Provider, MD  metoprolol succinate (TOPROL-XL) 25 MG 24 hr tablet Take 25 mg by mouth every morning.  01/06/12   Historical Provider, MD  simvastatin (ZOCOR) 20 MG tablet Take 20 mg by mouth every evening.  12/11/11   Historical Provider, MD     History reviewed. No pertinent family history.  History   Social History  . Marital Status: Married    Spouse Name: N/A  . Number of Children: N/A  . Years of Education: N/A   Social History Main Topics  . Smoking status: Former Smoker    Quit date: 11/05/2003  . Smokeless tobacco: Not on file  . Alcohol Use: Yes     Comment: wine  . Drug Use: No  . Sexual Activity: Not on file   Other  Topics Concern  . None   Social History Narrative     Review of Systems: A 12 point ROS discussed and pertinent positives are indicated in the HPI above.  All other systems are negative.  Review of Systems  Constitutional: Negative for fever, appetite change, fatigue and unexpected weight change.  Respiratory: Negative for shortness of breath.   Gastrointestinal: Negative for nausea.  Neurological: Negative for weakness.  Psychiatric/Behavioral: Negative for behavioral problems and confusion.     Vital Signs: BP 152/84 mmHg  Pulse 84  Temp(Src) 98.5 F (36.9 C) (Oral)  Resp 18  SpO2 100%  Physical Exam  Constitutional: He is oriented to person, place, and time.  HENT:  +trach  Cardiovascular: Normal rate, regular rhythm and normal heart sounds.   No murmur heard. Pulmonary/Chest: Effort normal and breath sounds normal. He has no wheezes.  Abdominal: Soft. Bowel sounds are normal. There is no tenderness.  Musculoskeletal: Normal range of motion.  Neurological: He is alert and oriented to person, place, and time.  Skin: Skin is warm and dry.  Psychiatric: He has a normal mood and affect. His behavior is normal. Judgment and thought content normal.  Nursing note and vitals reviewed.   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  EXAM: CT CHEST, ABDOMEN, AND PELVIS WITH CONTRAST  TECHNIQUE: Multidetector CT imaging of the chest, abdomen and pelvis was performed following the standard protocol during bolus administration of intravenous contrast.  CONTRAST:  168m OMNIPAQUE IOHEXOL 300 MG/ML  SOLN  COMPARISON:  08/06/2013  FINDINGS: CT CHEST FINDINGS  Mediastinum: Normal heart size. No  pericardial effusion. The patient has a tracheostomy tube. 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 The esophagus appears patulous and increased in diameter. No focal filling defects identified. Calcified atherosclerotic disease involves the thoracic aorta as well as the RCA, LAD and left circumflex coronary artery. No mediastinal or hilar adenopathy.  Lungs/Pleura: Pulmonary nodule within the left upper lobe measures 7 mm, image 12/series 4. Previous 3 mm. Focal area of ground-glass attenuation, architectural distortion and scarring within the right lung is again noted, likely reflecting post treatment changes.  Musculoskeletal: No aggressive lytic or sclerotic bone lesions.  CT ABDOMEN AND PELVIS FINDINGS  Upper Abdomen:  Normal appearance of the adrenal glands.  Hepatobiliary: Numerous liver lesions are identified. Index lesion in segment 7 measures 3.3 cm, image 44/series 2. Previously 3.0 cm. Segment 6 liver lesion measures 3.7 cm, image 54/ series 2. Previously 3.2 cm. Also in segment 6 is a 3.9 cm partially exophytic lesion, image 57/ series 2. Previous 3.1 cm. The gallbladder appears normal. No biliary dilatation.  Pancreas: Continued growth of soft tissue involving the tip of the tail of pancreas with central area of calcification an cyst. On today's study this measures 3.5 cm, image 68 of series 601. Previously 1.5 cm.  Spleen: Normal.  Adrenals/Urinary Tract: Normal. Normal appearance of the right kidney. Cyst is noted within the inferior pole of the left kidney. The urinary bladder appears normal.  Stomach/Bowel: The stomach and the small bowel loops have a normal course and caliber without evidence for bowel obstruction. The appendix is visualized and appears normal. Scattered distal colonic diverticula are identified.  Vascular/Lymphatic: Atherosclerotic disease involves the abdominal aorta. There is aneurysmal dilatation of the infrarenal abdominal  aorta which currently measures 3.9 cm, image 78/ series 2. Previously 3.8 cm. No enlarged retroperitoneal or mesenteric adenopathy. No enlarged pelvic or inguinal lymph nodes.  Reproductive: Prostate gland and seminal vesicles are unremarkable.  Other: There is no ascites or focal fluid collections within the abdomen or pelvis.  Musculoskeletal: There is no aggressive lytic or sclerotic bone lesion identified. Degenerative disc disease is noted within the lower lumbar spine. Most advanced at L4-5.  IMPRESSION: 1. Interval progression of disease. Index lesions within the liver have increased in size from previous exam. 2. Small pulmonary nodule in left upper lobe has also increased in the interval. 3. Continued increase in size of tail of pancreas. This is worrisome for underlying malignancy. Likely metastasis. 4. Atherosclerotic disease including 3 vessel Coronary artery calcification an infrarenal abdominal aortic aneurysm. The aneurysm currently measures 3.9 cm in maximum AP dimension. Recommend followup by ultrasound in 2 years. This recommendation follows ACR consensus guidelines: White Paper of the ACR Incidental Findings Committee II on Vascular Findings. J Am Coll Radiol 2013; 10:789-794.   Electronically Signed   By: TKerby MoorsM.D.   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  EXAM: CT CHEST, ABDOMEN, AND PELVIS WITH CONTRAST  TECHNIQUE: Multidetector CT imaging of the chest, abdomen and pelvis was performed following the standard protocol during bolus administration of intravenous contrast.  CONTRAST:  155m OMNIPAQUE IOHEXOL 300 MG/ML  SOLN  COMPARISON:  08/06/2013  FINDINGS: CT CHEST FINDINGS  Mediastinum: Normal heart size. No pericardial effusion. The patient has a tracheostomy tube. There is decreased AP diameter of the trachea  which may be seen with tracheobronchomalacia. The esophagus appears patulous and increased in diameter. No focal filling defects identified. Calcified atherosclerotic disease involves the thoracic aorta as well as the RCA, LAD and left circumflex coronary artery. No mediastinal or hilar adenopathy.  Lungs/Pleura: Pulmonary nodule within the left upper lobe measures 7 mm, image 12/series 4. Previous 3 mm. Focal area of ground-glass attenuation, architectural distortion and scarring within the right lung is again noted, likely reflecting post treatment changes.  Musculoskeletal: No aggressive lytic or sclerotic bone lesions.  CT ABDOMEN AND PELVIS FINDINGS  Upper Abdomen:  Normal appearance of the adrenal glands.  Hepatobiliary: Numerous liver lesions are identified. Index lesion in segment 7 measures 3.3 cm, image 44/series 2. Previously 3.0 cm. Segment 6 liver lesion measures 3.7 cm, image 54/ series 2. Previously 3.2 cm. Also in segment 6 is a 3.9 cm partially exophytic lesion, image 57/ series 2. Previous 3.1 cm. The gallbladder appears normal. No biliary dilatation.  Pancreas: Continued growth of soft tissue involving the tip of the tail of pancreas with central area of calcification an cyst. On today's study this measures 3.5 cm, image 68 of series 601. Previously 1.5 cm.  Spleen: Normal.  Adrenals/Urinary Tract: Normal. Normal appearance of the right kidney. Cyst is noted within the inferior pole of the left kidney. The urinary bladder appears normal.  Stomach/Bowel: The stomach and the small bowel loops have a normal course and caliber without evidence for bowel obstruction. The appendix is visualized and appears normal. Scattered distal colonic diverticula are identified.  Vascular/Lymphatic: Atherosclerotic disease involves the abdominal aorta. There is aneurysmal dilatation of the infrarenal abdominal aorta which currently measures 3.9 cm, image 78/ series 2. Previously 3.8 cm. No enlarged retroperitoneal  or mesenteric adenopathy. No enlarged pelvic or inguinal lymph nodes.  Reproductive: Prostate gland and seminal vesicles are unremarkable.  Other: There is no ascites or focal fluid collections within the abdomen or pelvis.  Musculoskeletal: There is no aggressive lytic or sclerotic bone lesion identified. Degenerative disc disease is noted within the lower lumbar spine. Most advanced at L4-5.  IMPRESSION: 1. Interval progression of disease. Index lesions within the liver have increased in size from previous exam. 2. Small pulmonary nodule in left upper lobe has also increased in the interval. 3. Continued increase in size of tail of pancreas. This is worrisome for underlying malignancy. Likely metastasis. 4. Atherosclerotic disease including 3 vessel Coronary artery calcification an infrarenal abdominal aortic aneurysm. The aneurysm currently measures 3.9 cm in maximum AP dimension. Recommend followup by ultrasound in 2 years. This recommendation follows ACR consensus guidelines: White Paper of the ACR Incidental Findings Committee II on Vascular Findings. J Am Coll Radiol 2013; 10:789-794.   Electronically Signed   By: TKerby MoorsM.D.   On: 09/29/2014 12:42    Labs:  CBC:  Recent Labs  09/29/14 0940 10/15/14 1145  WBC 6.8 8.0  HGB 14.1 13.4  HCT 43.7 40.7  PLT 213  214    COAGS:  Recent Labs  10/15/14 1145  INR 1.08  APTT 41*    BMP:  Recent Labs  09/29/14 0940  NA 139  K 4.2  CO2 22  GLUCOSE 117  BUN 16.0  CALCIUM 9.4  CREATININE 1.0    LIVER FUNCTION TESTS:  Recent Labs  09/29/14 0940  BILITOT 0.39  AST 27  ALT 18  ALKPHOS 100  PROT 7.6  ALBUMIN 3.8    TUMOR MARKERS: No results for input(s): AFPTM, CEA, CA199, CHROMGRNA in the last 8760 hours.  Assessment and Plan:  Hx Laryngeal and Lung Ca Previous bx of liver lesion 2011 was neg path Now liver lesion enlarging and scheduled for new bx Risks and Benefits discussed with the patient including, but  not limited to bleeding, infection, damage to adjacent structures or low yield requiring additional tests. All of the patient's questions were answered, patient is agreeable to proceed. Consent signed and in chart.   Thank you for this interesting consult.  I greatly enjoyed meeting GAUGE WINSKI and look forward to participating in their care.  Signed: Tavyn Kurka A 10/15/2014, 1:05 PM   I spent a total of  20 Minutes   in face to face in clinical consultation, greater than 50% of which was counseling/coordinating care for liver lesion bx

## 2014-10-15 NOTE — Procedures (Signed)
Successful RT INFERIOR hepatic lobe mass core bx No comp Stable Path pending Full report in PACS

## 2014-10-15 NOTE — Progress Notes (Signed)
reviewed home care instructions with patient and wife. Denies any pain good po no N/V. bandaid at biopsy site dry and intact with tiny spot of bloody drainage noted that is unchanged since arrival to room post procedure.

## 2014-10-15 NOTE — Discharge Instructions (Signed)
°Liver Biopsy, Care After °These instructions give you information on caring for yourself after your procedure. Your doctor may also give you more specific instructions. Call your doctor if you have any problems or questions after your procedure. °HOME CARE °· Rest at home for 1-2 days or as told by your doctor. °· Have someone stay with you for at least 24 hours. °· Do not do these things in the first 24 hours: °¨ Drive. °¨ Use machinery. °¨ Take care of other people. °¨ Sign legal documents. °¨ Take a bath or shower. °· There are many different ways to close and cover a cut (incision). For example, a cut can be closed with stitches, skin glue, or adhesive strips. Follow your doctor's instructions on: °¨ Taking care of your cut. °¨ Changing and removing your bandage (dressing). °¨ Removing whatever was used to close your cut. °· Do not drink alcohol in the first week. °· Do not lift more than 5 pounds or play contact sports for the first 2 weeks. °· Take medicines only as told by your doctor. For 1 week, do not take medicine that has aspirin in it or medicines like ibuprofen. °· Get your test results. °GET HELP IF: °· A cut bleeds and leaves more than just a small spot of blood. °· A cut is red, puffs up (swells), or hurts more than before. °· Fluid or something else comes from a cut. °· A cut smells bad. °· You have a fever or chills. °GET HELP RIGHT AWAY IF: °· You have swelling, bloating, or pain in your belly (abdomen). °· You get dizzy or faint. °· You have a rash. °· You feel sick to your stomach (nauseous) or throw up (vomit). °· You have trouble breathing, feel short of breath, or feel faint. °· Your chest hurts. °· You have problems talking or seeing. °· You have trouble balancing or moving your arms or legs. °Document Released: 03/07/2008 Document Revised: 10/13/2013 Document Reviewed: 07/25/2013 °Exit[employer]® Patient Information ©2015 [employer]. This information is not intended to replace advice given  to you by your health care provider. Make sure you discuss any questions you have with your health care provider. °Liver Biopsy °The liver is a large organ in the upper right-hand side of your abdomen. A liver biopsy is a procedure in which a tissue sample is taken from the liver and examined under a microscope. The procedure is done to confirm a suspected problem. °There are three types of liver biopsies: °· Percutaneous. In this type, an incision is made in your abdomen. The sample is removed through the incision with a needle. °· Laparoscopic. In this type, several incisions are made in the abdomen. A tiny camera is passed through one of the incisions to help guide the health care provider. The sample is removed through the other incision or incisions. °· Transjugular. In this type, an incision is made in the neck. A tube is passed through the incision to the liver. The sample is removed through the tube with a needle. °LET YOUR HEALTH CARE PROVIDER KNOW ABOUT: °· Any allergies you have. °· All medicines you are taking, including vitamins, herbs, eye drops, creams, and over-the-counter medicines. °· Previous problems you or members of your family have had with the use of anesthetics. °· Any blood disorders you have. °· Previous surgeries you have had. °· Medical conditions you have. °· Possibility of pregnancy, if this applies. °RISKS AND COMPLICATIONS °Generally, this is a safe procedure. However, problems can   occur and include: °· Bleeding. °· Infection. °· Bruising. °· Collapsed lung. °· Leak of digestive juices (bile) from the liver or gallbladder. °· Problems with heart rhythm. °· Pain at the biopsy site or in the right shoulder. °· Low blood pressure (hypotension). °· Injury to nearby organs or tissues. °BEFORE THE PROCEDURE °· Your health care provider may do some blood or urine tests. These will help your health care provider learn how well your kidneys and liver are working and how well your blood  clots. °· Ask your health care provider if you will be able to go home the day of the procedure. Arrange for someone to take you home and stay with you for at least 24 hours. °· Do not eat or drink anything after midnight on the night before the procedure or as directed by your health care provider. °· Ask your health care provider about: °¨ Changing or stopping your regular medicines. This is especially important if you are taking diabetes medicines or blood thinners. °¨ Taking medicines such as aspirin and ibuprofen. These medicines can thin your blood. Do not take these medicines before your procedure if your health care provider asks you not to. °PROCEDURE °Regardless of the type of biopsy that will be done, you will have an IV line placed. Through this line, you will receive fluids and medicine to relax you. If you will be having a laparoscopic biopsy, you may also receive medicine through this line to make you sleep during the procedure (general anesthetic). °Percutaneous Liver Biopsy °· You will positioned on your back, with your right hand over your head. °· A health care provider will locate your liver by tapping and pressing on the right side of your abdomen or with the help of an ultrasound machine or CT scan. °· An area at the bottom of your last right rib will be numbed. °· An incision will be made in the numbed area. °· The biopsy needle will be inserted into the incision. °· Several samples of liver tissue will be taken with the biopsy needle. You will be asked to hold your breath as each sample is taken. °Laparoscopic Liver Biopsy °· You will be positioned on your back. °· Several small incisions will be made in your abdomen. °· Your doctor will pass a tiny camera through one incision. The camera will allow the liver to be viewed on a TV monitor in the operating room. °· Tools will be passed through the other incision or incisions. These tools will be used to remove samples of liver  tissue. °Transjugular Liver Biopsy °· You will be positioned on your back on an X-ray table, with your head turned to your left. °· An area on your neck just over your jugular vein will be numbed. °· An incision will be made in the numbed area. °· A tiny tube will be inserted through the incision. It will be pushed through the jugular vein to a blood vessel in the liver called the hepatic vein. °· Dye will be inserted through the tube, and X-rays will be taken. The dye will make the blood vessels in the liver light up on the X-rays. °· The biopsy needle will be pushed through the tube until it reaches the liver. °· Samples of liver tissue will be taken with the biopsy needle. °· The needle and the tube will be removed. °After the samples are obtained, the incision or incisions will be closed. °AFTER THE PROCEDURE °· You will be taken to   a recovery area. °· You may have to lie on your right side for 1-2 hours. This will prevent bleeding from the biopsy site. °· Your progress will be watched. Your blood pressure, pulse, and the biopsy site will be checked often. °· You may have some pain or feel sick. If this happens, tell your health care provider. °· As you begin to feel better, you will be offered ice and beverages. °· You may be allowed to go home when the medicines have worn off and you can walk, drink, eat, and use the bathroom. °Document Released: 08/19/2003 Document Revised: 10/13/2013 Document Reviewed: 07/25/2013 °ExitCare® Patient Information ©2015 [employer]. This information is not intended to replace advice given to you by your health care provider. Make sure you discuss any questions you have with your health care provider. °Conscious Sedation °Sedation is the use of medicines to promote relaxation and relieve discomfort and anxiety. Conscious sedation is a type of sedation. Under conscious sedation you are less alert than normal but are still able to respond to instructions or stimulation. Conscious  sedation is used during short medical and dental procedures. It is milder than deep sedation or general anesthesia and allows you to return to your regular activities sooner.  °LET YOUR HEALTH CARE PROVIDER KNOW ABOUT:  °· Any allergies you have. °· All medicines you are taking, including vitamins, herbs, eye drops, creams, and over-the-counter medicines. °· Use of steroids (by mouth or creams). °· Previous problems you or members of your family have had with the use of anesthetics. °· Any blood disorders you have. °· Previous surgeries you have had. °· Medical conditions you have. °· Possibility of pregnancy, if this applies. °· Use of cigarettes, alcohol, or illegal drugs. °RISKS AND COMPLICATIONS °Generally, this is a safe procedure. However, as with any procedure, problems can occur. Possible problems include: °· Oversedation. °· Trouble breathing on your own. You may need to have a breathing tube until you are awake and breathing on your own. °· Allergic reaction to any of the medicines used for the procedure. °BEFORE THE PROCEDURE °· You may have blood tests done. These tests can help show how well your kidneys and liver are working. They can also show how well your blood clots. °· A physical exam will be done.   °· Only take medicines as directed by your health care provider. You may need to stop taking medicines (such as blood thinners, aspirin, or nonsteroidal anti-inflammatory drugs) before the procedure.   °· Do not eat or drink at least 6 hours before the procedure or as directed by your health care provider. °· Arrange for a responsible adult, family member, or friend to take you home after the procedure. He or she should stay with you for at least 24 hours after the procedure, until the medicine has worn off. °PROCEDURE  °· An intravenous (IV) catheter will be inserted into one of your veins. Medicine will be able to flow directly into your body through this catheter. You may be given medicine through  this tube to help prevent pain and help you relax. °· The medical or dental procedure will be done. °AFTER THE PROCEDURE °· You will stay in a recovery area until the medicine has worn off. Your blood pressure and pulse will be checked.   °·  Depending on the procedure you had, you may be allowed to go home when you can tolerate liquids and your pain is under control. °Document Released: 02/21/2001 Document Revised: 06/03/2013 Document Reviewed: 02/03/2013 °ExitCare® Patient   Information 2015 [place], [place]. This information is not intended to replace advice given to you by your health care provider. Make sure you discuss any questions you have with your health care provider.

## 2014-11-28 ENCOUNTER — Other Ambulatory Visit: Payer: Self-pay | Admitting: Internal Medicine

## 2014-11-30 ENCOUNTER — Encounter: Payer: Self-pay | Admitting: Internal Medicine

## 2014-11-30 ENCOUNTER — Telehealth: Payer: Self-pay | Admitting: Internal Medicine

## 2014-11-30 ENCOUNTER — Ambulatory Visit (INDEPENDENT_AMBULATORY_CARE_PROVIDER_SITE_OTHER): Payer: Medicare Other | Admitting: Internal Medicine

## 2014-11-30 VITALS — BP 164/80 | HR 82 | Temp 98.6°F | Resp 16 | Ht 66.0 in | Wt 178.0 lb

## 2014-11-30 DIAGNOSIS — Z8673 Personal history of transient ischemic attack (TIA), and cerebral infarction without residual deficits: Secondary | ICD-10-CM

## 2014-11-30 DIAGNOSIS — K7689 Other specified diseases of liver: Secondary | ICD-10-CM

## 2014-11-30 DIAGNOSIS — I10 Essential (primary) hypertension: Secondary | ICD-10-CM | POA: Diagnosis not present

## 2014-11-30 DIAGNOSIS — K769 Liver disease, unspecified: Secondary | ICD-10-CM

## 2014-11-30 DIAGNOSIS — R2681 Unsteadiness on feet: Secondary | ICD-10-CM | POA: Diagnosis not present

## 2014-11-30 MED ORDER — TIZANIDINE HCL 4 MG PO TABS: 4.0000 mg | ORAL_TABLET | Freq: Three times a day (TID) | ORAL | Status: AC | PRN

## 2014-11-30 NOTE — Patient Instructions (Addendum)
We will get your records from Dr. Karlton Lemon.  It was nice to see you today and we can see you back sometime this fall for your physical.   We will try a new medicine called tizanidine for the leg spasms. I would try 1/2 pill to start with to make sure you do not get any side effects with it. Once you are used to it you can take 1 pill up to 3 times a day and see how it does. The most common side effects are dizziness and nausea although nausea is more common. It does better if you take it with food.

## 2014-11-30 NOTE — Telephone Encounter (Signed)
lvm for pt regarding to 6.27 appt.Marland KitchenMarland KitchenMarland Kitchen

## 2014-12-01 ENCOUNTER — Telehealth: Payer: Self-pay | Admitting: Internal Medicine

## 2014-12-01 NOTE — Telephone Encounter (Signed)
s.w pt and advised that i could not advised on why he needs a sooner appt.Marland KitchenMarland KitchenMary the desk nurse will call him back to advised why earlier appt needed

## 2014-12-02 ENCOUNTER — Encounter: Payer: Self-pay | Admitting: Internal Medicine

## 2014-12-02 DIAGNOSIS — I10 Essential (primary) hypertension: Secondary | ICD-10-CM | POA: Insufficient documentation

## 2014-12-02 DIAGNOSIS — R2681 Unsteadiness on feet: Secondary | ICD-10-CM | POA: Insufficient documentation

## 2014-12-02 DIAGNOSIS — Z8673 Personal history of transient ischemic attack (TIA), and cerebral infarction without residual deficits: Secondary | ICD-10-CM | POA: Insufficient documentation

## 2014-12-02 NOTE — Assessment & Plan Note (Signed)
Will D/C baclofen and try tizanidine for the shaking of his leg with walking. Sequelae of stroke years ago.

## 2014-12-02 NOTE — Progress Notes (Signed)
   Subjective:    Patient ID: [name], male    DOB: [birth year], 74 y.o.   MRN: [id number]  HPI The patient is a 74 YO man who is coming in for his blood pressure. He has been doing really well with it. It has been a little more unstable lately when he has checked it. He has not taken his medicines yet today due to coming for the visit. They are also slightly concerned about a liver biopsy that was done about 6 weeks ago since they recently got a call from the oncologist's office to come in for a visit. He has had this growing liver lesion for some time and previous biopsy without definitive problem. No chest pains, SOB, headaches. Also having problems with shaking of his left leg. This has been going on for years since his stroke. Tried baclofen in the past which is not doing well. Happens when he walks at home and he has to stop and hold it to stop the shaking.   Review of Systems  Constitutional: Positive for activity change. Negative for fever, appetite change, fatigue and unexpected weight change.  HENT: Negative.   Eyes: Negative.   Respiratory: Negative for cough, chest tightness, shortness of breath and wheezing.   Cardiovascular: Negative for chest pain, palpitations and leg swelling.  Gastrointestinal: Negative for abdominal pain, diarrhea, constipation and abdominal distention.  Musculoskeletal: Positive for arthralgias and gait problem.  Skin: Negative.   Neurological: Positive for tremors, speech difficulty and weakness. Negative for dizziness and light-headedness.  Psychiatric/Behavioral: Negative.       Objective:   Physical Exam  Constitutional: He is oriented to person, place, and time. He appears well-developed and well-nourished.  HENT:  Head: Normocephalic and atraumatic.  Voice box in place, speaks through that.   Eyes: EOM are normal.  Neck: Normal range of motion. No JVD present.  Cardiovascular: Normal rate and regular rhythm.   Pulmonary/Chest: Effort  normal. No respiratory distress. He has no wheezes.  Abdominal: Soft. He exhibits no distension. There is no tenderness. There is no rebound.  Musculoskeletal: He exhibits no edema.  Neurological: He is alert and oriented to person, place, and time. Coordination abnormal.  Left arm at side, not used. In wheelchair for long distances but can walk short distances at home.   Skin: Skin is warm and dry.  Psychiatric: He has a normal mood and affect.   Filed Vitals:   11/30/14 1556  BP: 164/80  Pulse: 82  Temp: 98.6 F (37 C)  TempSrc: Oral  Resp: 16  Height: '5\' 6"'$  (1.676 m)  Weight: 178 lb (80.74 kg)  SpO2: 97%      Assessment & Plan:

## 2014-12-02 NOTE — Assessment & Plan Note (Signed)
Causing gait instability now. No recurrent stroke symptoms.

## 2014-12-02 NOTE — Assessment & Plan Note (Signed)
Did not discuss liver biopsy results with them as they are seeing oncology soon. They are aware that cancer is a possibility.

## 2014-12-02 NOTE — Assessment & Plan Note (Signed)
BP elevated today and has not yet had medication. BP not higher than 150/90 at home and will see back on meds and adjust as needed. Continue amlodipine 5 mg daily, toprol-xl 25 mg daily. Room to adjust as needed. No signs of urgency.

## 2014-12-03 ENCOUNTER — Telehealth: Payer: Self-pay

## 2014-12-03 ENCOUNTER — Telehealth: Payer: Self-pay | Admitting: Internal Medicine

## 2014-12-03 NOTE — Telephone Encounter (Signed)
Received records from [facility] forwarded to Dr. Doug Sou 12/03/14 fbg

## 2014-12-03 NOTE — Telephone Encounter (Signed)
Wife called asking why pt needs labs on his 6/27 appt. Noted that on 6/21 they called asking why appt was moved sooner than October.

## 2014-12-03 NOTE — Telephone Encounter (Signed)
Biopsy was positive

## 2014-12-03 NOTE — Telephone Encounter (Signed)
S/w wife re Dr Worthy Flank response.

## 2014-12-07 ENCOUNTER — Other Ambulatory Visit (HOSPITAL_BASED_OUTPATIENT_CLINIC_OR_DEPARTMENT_OTHER): Payer: Medicare Other

## 2014-12-07 ENCOUNTER — Telehealth: Payer: Self-pay | Admitting: Oncology

## 2014-12-07 ENCOUNTER — Ambulatory Visit (HOSPITAL_BASED_OUTPATIENT_CLINIC_OR_DEPARTMENT_OTHER): Payer: Medicare Other | Admitting: Oncology

## 2014-12-07 ENCOUNTER — Encounter: Payer: Self-pay | Admitting: Oncology

## 2014-12-07 ENCOUNTER — Telehealth: Payer: Self-pay | Admitting: Internal Medicine

## 2014-12-07 ENCOUNTER — Other Ambulatory Visit: Payer: Self-pay | Admitting: Geriatric Medicine

## 2014-12-07 ENCOUNTER — Other Ambulatory Visit: Payer: Self-pay

## 2014-12-07 VITALS — BP 182/96 | HR 98 | Temp 98.9°F | Resp 18 | Ht 66.0 in | Wt 177.0 lb

## 2014-12-07 DIAGNOSIS — C787 Secondary malignant neoplasm of liver and intrahepatic bile duct: Secondary | ICD-10-CM | POA: Diagnosis not present

## 2014-12-07 DIAGNOSIS — C329 Malignant neoplasm of larynx, unspecified: Secondary | ICD-10-CM

## 2014-12-07 DIAGNOSIS — Z8521 Personal history of malignant neoplasm of larynx: Secondary | ICD-10-CM

## 2014-12-07 DIAGNOSIS — C3410 Malignant neoplasm of upper lobe, unspecified bronchus or lung: Secondary | ICD-10-CM

## 2014-12-07 DIAGNOSIS — K769 Liver disease, unspecified: Secondary | ICD-10-CM

## 2014-12-07 DIAGNOSIS — C3412 Malignant neoplasm of upper lobe, left bronchus or lung: Secondary | ICD-10-CM

## 2014-12-07 LAB — COMPREHENSIVE METABOLIC PANEL (CC13)
ALK PHOS: 89 U/L (ref 40–150)
ALT: 19 U/L (ref 0–55)
AST: 25 U/L (ref 5–34)
Albumin: 3.5 g/dL (ref 3.5–5.0)
Anion Gap: 8 mEq/L (ref 3–11)
BILIRUBIN TOTAL: 0.35 mg/dL (ref 0.20–1.20)
BUN: 12.2 mg/dL (ref 7.0–26.0)
CALCIUM: 9.2 mg/dL (ref 8.4–10.4)
CHLORIDE: 107 meq/L (ref 98–109)
CO2: 28 mEq/L (ref 22–29)
CREATININE: 1 mg/dL (ref 0.7–1.3)
EGFR: 86 mL/min/{1.73_m2} — ABNORMAL LOW (ref >90)
Glucose: 105 mg/dl (ref 70–140)
Potassium: 4.3 mEq/L (ref 3.5–5.1)
Sodium: 143 mEq/L (ref 136–145)
Total Protein: 6.8 g/dL (ref 6.4–8.3)

## 2014-12-07 MED ORDER — PROCHLORPERAZINE MALEATE 10 MG PO TABS: 10.0000 mg | ORAL_TABLET | Freq: Four times a day (QID) | ORAL | Status: DC | PRN

## 2014-12-07 NOTE — Telephone Encounter (Signed)
Patient wife stated that patient need are refill of all his medications send to [facility], and after that fax to  [facility], [facility] fax # [phone]

## 2014-12-07 NOTE — Progress Notes (Signed)
[place] Telephone:([phone]) [phone]   Fax:(336) [phone]  OFFICE PROGRESS NOTE  Olga Millers, MD [place] [address]  PRINCIPAL DIAGNOSES:  1. 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 2. Stage IA non-small-cell lung cancer diagnosed in June 2011.  PRIOR THERAPY:  1. Status post total laryngectomy with. Bilateral selective lymph node dissection as well as left superior parotidectomy with nerve dissection under the care of Dr. Redmond Baseman with positive resection margin. 2. Status post course of concurrent chemoradiation with weekly cisplatin. Last dose of chemotherapy was given January 08, 2007. 3. Status post curative radiotherapy with tomotherapy to the lung lesions completed December 31, 2009 under the care of Dr. Pablo Ledger.  CURRENT THERAPY: Observation.  INTERVAL HISTORY: [name] 74 y.o. male returns to the clinic today for discussion of his recent liver biopsy results. The patient has no significant change since his last visit. The patient is feeling fine today with no specific complaints. He denied having any significant chest pain, shortness of breath, cough or hemoptysis. He has no weight loss or night sweats.   MEDICAL HISTORY: Past Medical History  Diagnosis Date  . Hypertension   . 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 Cancer 2009    SUPERGLOTTIS INVASIVE SQ CELL CA    ALLERGIES:  has No Known Allergies.  MEDICATIONS:  Current Outpatient Prescriptions  Medication Sig Dispense Refill  . albuterol (PROVENTIL) (2.5 MG/3ML) 0.083% nebulizer solution Take 2.5 mg by nebulization every 6 (six) hours as needed for wheezing or shortness of breath.    Marland Kitchen amLODipine (NORVASC) 5 MG tablet Take 5 mg by mouth at bedtime.     Marland Kitchen aspirin 81 MG tablet Take 81 mg by mouth every morning.     . baclofen (LIORESAL) 10 MG tablet Take 10 mg by mouth at bedtime.     . cholecalciferol (VITAMIN D) 1000  UNITS tablet Take 1,000 Units by mouth every morning.     . finasteride (PROSCAR) 5 MG tablet Take 5 mg by mouth every morning.     . metoprolol succinate (TOPROL-XL) 25 MG 24 hr tablet Take 25 mg by mouth every morning.     . simvastatin (ZOCOR) 20 MG tablet Take 20 mg by mouth every evening.     Marland Kitchen tiZANidine (ZANAFLEX) 4 MG tablet Take 1 tablet (4 mg total) by mouth 3 (three) times daily as needed for muscle spasms. 90 tablet 1  . prochlorperazine (COMPAZINE) 10 MG tablet Take 1 tablet (10 mg total) by mouth every 6 (six) hours as needed for nausea or vomiting. 30 tablet 0   No current facility-administered medications for this visit.    REVIEW OF SYSTEMS:  A comprehensive review of systems was negative.   PHYSICAL EXAMINATION: General appearance: alert, cooperative and no distress Head: Normocephalic, without obvious abnormality, atraumatic Neck: no adenopathy Lymph nodes: Cervical, supraclavicular, and axillary nodes normal. Resp: clear to auscultation bilaterally Cardio: regular rate and rhythm, S1, S2 normal, no murmur, click, rub or gallop GI: soft, non-tender; bowel sounds normal; no masses,  no organomegaly Extremities: extremities normal, atraumatic, no cyanosis or edema  ECOG PERFORMANCE STATUS: 1 - Symptomatic but completely ambulatory  Blood pressure 182/96, pulse 98, temperature 98.9 F (37.2 C), temperature source Oral, resp. rate 18, height '5\' 6"'$  (1.676 m), weight 177 lb (80.287 kg), SpO2 97 %.  LABORATORY DATA: Lab Results  Component Value Date  WBC 8.0 10/15/2014   HGB 13.4 10/15/2014   HCT 40.7 10/15/2014   MCV 79.8 10/15/2014   PLT 214 10/15/2014      Chemistry      Component Value Date/Time   NA 143 12/07/2014 1037   NA 139 01/19/2012 0942   NA 141 03/31/2010 1005   K 4.3 12/07/2014 1037   K 4.1 01/19/2012 0942   K 3.7 03/31/2010 1005   CL 103 08/02/2012 1107   CL 96* 01/19/2012 0942   CL 105 03/31/2010 1005   CO2 28 12/07/2014 1037   CO2 28  01/19/2012 0942   CO2 31 03/31/2010 1005   BUN 12.2 12/07/2014 1037   BUN 14 01/19/2012 0942   BUN 12 03/31/2010 1005   CREATININE 1.0 12/07/2014 1037   CREATININE 1.2 01/19/2012 0942   CREATININE 1.02 03/31/2010 1005      Component Value Date/Time   CALCIUM 9.2 12/07/2014 1037   CALCIUM 9.5 01/19/2012 0942   CALCIUM 9.0 03/31/2010 1005   ALKPHOS 89 12/07/2014 1037   ALKPHOS 64 01/19/2012 0942   ALKPHOS 60 03/31/2010 1005   AST 25 12/07/2014 1037   AST 27 01/19/2012 0942   AST 21 03/31/2010 1005   ALT 19 12/07/2014 1037   ALT 23 01/19/2012 0942   ALT 19 03/31/2010 1005   BILITOT 0.35 12/07/2014 1037   BILITOT 0.50 01/19/2012 0942   BILITOT 0.4 03/31/2010 1005     PATHOLOGY: Diagnosis Liver, needle/core biopsy, right inferior hepatic - NON-SMALL CELL CARCINOMA. Microscopic Comment The core biopsies of liver are extensively involved by non-small cell carcinoma consistent with metastatic carcinoma. Immunohistochemistry will be performed and reported as an addendum. (JDP:ecj 10/16/2014) ADDENDUM: Immunohistochemistry is performed and the tumor is strongly positive with MOC-31 and shows patchy positivity with cytokeratin 7 and p16 (HPV). The tumor is negative with cytokeratin 5/6, cytokeratin 903, p63, thyroid transcription factor-1 and CDX-2. The immunophenotype is non specific. 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 (JDP:gt, 10/19/14) Claudette Laws MD Pathologist, Electronic Signature (Case signed 10/16/2014) Corrected Report Signer Claudette Laws MD Pathologist, Electronic Signature (Case signed 10/19/2014) Specimen Gross and Clinical Information  RADIOGRAPHIC STUDIES: No results found.  ASSESSMENT AND PLAN: This is a very pleasant 74 year old African American male with history of stage IVC supraglottic invasive squamous cell  carcinoma diagnosed in May of 2008 in addition to a stage IA non-small cell lung cancer diagnosed in June of 2011 status post resection as well as concurrent chemoradiation for the laryngeal carcinoma and curative radiotherapy to the lung lesion. The patient has been observation since July of 2011 with no evidence for disease recurrence over the last few years. The recent CT scan of the chest showed interval progression of disease especially in the liver as well as small pulmonary nodules in the left upper lobe and continued increase in the size of the tail of the pancreas worrisome for underlying malignancy.  The patient had previous biopsy in the past that showed no evidence of malignancy but this was few years ago. Biopsy performed in May 2016 is consistent with metastatic cancer.  Patient seen and examined with Dr. Julien Nordmann. Biopsy results discussed. Recommend treatment with systemic chemotherapy with Carboplatin for an AUC of 5 and paclitaxel 175 MG/M2 given every 3 weeks. Side effect discussed including fatigue, alopecia, nausea, and vomiting. The patient would like to proceed and we will plan  to get him started the week on 12/21/14. Compazine prescription was sent to the local pharmacy.   Plan is for 3 cycles and then restaging to see how he responds. We may consider immunotherapy in the future.    The patient and his wife agreed to the current plan. We will see him back on about 2 weeks on the day he begins chemo.  He was advised to call me immediately if he has any concerning symptoms in the interval the  All questions were answered. The patient knows to call the clinic with any problems, questions or concerns. We can certainly see the patient much sooner if necessary.  Mikey Bussing, DNP, AGPCNP-BC, AOCNP   ADDENDUM: Hematology/Oncology Attending: I had a face to face encounter with the patient. I recommended his care plan. This is a very pleasant 74 years old African-American male with  history of stage IV supraglottic squamous cell carcinoma diagnosed in May 2018 as well as stage I a non-small cell lung cancer diagnosed in June 2011 and has been observation for the last several years. The patient had suspicious liver lesions for several years but recently it showed evidence for progression. It was previously biopsied and the final pathology was negative for malignancy. A recent biopsy of one of the progressive liver lesions was consistent with metastatic squamous cell carcinoma. The patient is here today for evaluation and discussion of his treatment options. He is feeling fine today was no specific complaints. I had a lengthy discussion with the patient and his wife about the biopsy results and treatment options. I gave the patient the option of palliative care and close monitoring versus proceeding with systemic chemotherapy was carboplatin for AUC of 5 and paclitaxel and 75 MG/M2 every 3 weeks with Neulasta support. The patient is interested in proceeding with systemic chemotherapy. I discussed with him the adverse effect of this treatment including but not limited to alopecia, myelosuppression, nausea and vomiting, peripheral neuropathy, liver or renal dysfunction. He is expected to start the first cycle of this treatment in 2 weeks. He would come back for follow-up visit for reevaluation at that time. The patient was advised to call immediately if he has any concerning symptoms in the interval.  Disclaimer: This note was dictated with voice recognition software. Similar sounding words can inadvertently be transcribed and may be missed upon review. [name]., MD 12/09/2014

## 2014-12-07 NOTE — Telephone Encounter (Signed)
Appointments made and avs pritned for patient °

## 2014-12-08 ENCOUNTER — Telehealth: Payer: Self-pay | Admitting: Oncology

## 2014-12-08 ENCOUNTER — Encounter: Payer: Self-pay | Admitting: Internal Medicine

## 2014-12-08 ENCOUNTER — Other Ambulatory Visit: Payer: Self-pay | Admitting: Geriatric Medicine

## 2014-12-08 DIAGNOSIS — I10 Essential (primary) hypertension: Secondary | ICD-10-CM

## 2014-12-08 DIAGNOSIS — C329 Malignant neoplasm of larynx, unspecified: Secondary | ICD-10-CM

## 2014-12-08 MED ORDER — AMLODIPINE BESYLATE 5 MG PO TABS: 5.0000 mg | ORAL_TABLET | Freq: Every day | ORAL | Status: DC

## 2014-12-08 NOTE — Telephone Encounter (Signed)
Appointments cancelled per pof

## 2014-12-08 NOTE — Telephone Encounter (Signed)
Sent to pharmacy 

## 2014-12-08 NOTE — Telephone Encounter (Signed)
Patient wife called again, he need all medications sent to [facility] (Amlodiphine)

## 2014-12-09 ENCOUNTER — Other Ambulatory Visit: Payer: Self-pay | Admitting: Geriatric Medicine

## 2014-12-09 NOTE — Telephone Encounter (Signed)
See note below: refills for baclofen (LIORESAL) 10 MG tablet [[id number], finasteride (PROSCAR) 5 MG tablet [[id number],  metoprolol succinate (TOPROL-XL) 25 MG 24 hr tablet [[id number]], and  simvastatin (ZOCOR) 20 MG tablet [[id number].  They will need to go to [facility] then all refills after that will need to go to [facility]

## 2014-12-09 NOTE — Telephone Encounter (Signed)
Wife called again regarding this. He still needs refills

## 2014-12-10 ENCOUNTER — Other Ambulatory Visit: Payer: Self-pay | Admitting: Geriatric Medicine

## 2014-12-10 DIAGNOSIS — E785 Hyperlipidemia, unspecified: Secondary | ICD-10-CM

## 2014-12-10 DIAGNOSIS — I10 Essential (primary) hypertension: Secondary | ICD-10-CM

## 2014-12-10 DIAGNOSIS — C329 Malignant neoplasm of larynx, unspecified: Secondary | ICD-10-CM

## 2014-12-10 DIAGNOSIS — R269 Unspecified abnormalities of gait and mobility: Secondary | ICD-10-CM

## 2014-12-10 DIAGNOSIS — K769 Liver disease, unspecified: Secondary | ICD-10-CM

## 2014-12-10 DIAGNOSIS — Z8673 Personal history of transient ischemic attack (TIA), and cerebral infarction without residual deficits: Secondary | ICD-10-CM

## 2014-12-10 MED ORDER — METOPROLOL SUCCINATE ER 25 MG PO TB24: 25.0000 mg | ORAL_TABLET | Freq: Every morning | ORAL | Status: DC

## 2014-12-10 MED ORDER — FINASTERIDE 5 MG PO TABS: 5.0000 mg | ORAL_TABLET | Freq: Every morning | ORAL | Status: DC

## 2014-12-10 MED ORDER — BACLOFEN 10 MG PO TABS: 10.0000 mg | ORAL_TABLET | Freq: Every day | ORAL | Status: DC

## 2014-12-10 MED ORDER — SIMVASTATIN 20 MG PO TABS: 20.0000 mg | ORAL_TABLET | Freq: Every evening | ORAL | Status: DC

## 2014-12-10 NOTE — Telephone Encounter (Signed)
Sent to pharmacy 

## 2014-12-21 ENCOUNTER — Ambulatory Visit (HOSPITAL_BASED_OUTPATIENT_CLINIC_OR_DEPARTMENT_OTHER): Payer: Medicare Other | Admitting: Oncology

## 2014-12-21 ENCOUNTER — Other Ambulatory Visit (HOSPITAL_BASED_OUTPATIENT_CLINIC_OR_DEPARTMENT_OTHER): Payer: Medicare Other

## 2014-12-21 ENCOUNTER — Ambulatory Visit (HOSPITAL_BASED_OUTPATIENT_CLINIC_OR_DEPARTMENT_OTHER): Payer: Medicare Other

## 2014-12-21 ENCOUNTER — Encounter: Payer: Self-pay | Admitting: Oncology

## 2014-12-21 ENCOUNTER — Telehealth: Payer: Self-pay | Admitting: Oncology

## 2014-12-21 VITALS — BP 122/72 | HR 64 | Temp 99.6°F | Resp 18 | Ht 66.0 in | Wt 174.8 lb

## 2014-12-21 VITALS — BP 140/78 | HR 64 | Temp 97.1°F | Resp 18

## 2014-12-21 DIAGNOSIS — C787 Secondary malignant neoplasm of liver and intrahepatic bile duct: Secondary | ICD-10-CM

## 2014-12-21 DIAGNOSIS — C3412 Malignant neoplasm of upper lobe, left bronchus or lung: Secondary | ICD-10-CM | POA: Diagnosis not present

## 2014-12-21 DIAGNOSIS — Z8521 Personal history of malignant neoplasm of larynx: Secondary | ICD-10-CM

## 2014-12-21 DIAGNOSIS — C329 Malignant neoplasm of larynx, unspecified: Secondary | ICD-10-CM

## 2014-12-21 DIAGNOSIS — C3410 Malignant neoplasm of upper lobe, unspecified bronchus or lung: Secondary | ICD-10-CM

## 2014-12-21 DIAGNOSIS — Z5111 Encounter for antineoplastic chemotherapy: Secondary | ICD-10-CM | POA: Diagnosis not present

## 2014-12-21 LAB — CBC WITH DIFFERENTIAL/PLATELET
BASO%: 0.3 % (ref 0.0–2.0)
BASOS ABS: 0 10*3/uL (ref 0.0–0.1)
EOS%: 1.9 % (ref 0.0–7.0)
Eosinophils Absolute: 0.1 10*3/uL (ref 0.0–0.5)
HCT: 38.8 % (ref 38.4–49.9)
HGB: 12.7 g/dL — ABNORMAL LOW (ref 13.0–17.1)
LYMPH%: 19.2 % (ref 14.0–49.0)
MCH: 25.7 pg — ABNORMAL LOW (ref 27.2–33.4)
MCHC: 32.7 g/dL (ref 32.0–36.0)
MCV: 78.4 fL — AB (ref 79.3–98.0)
MONO#: 0.4 10*3/uL (ref 0.1–0.9)
MONO%: 7.1 % (ref 0.0–14.0)
NEUT#: 4.2 10*3/uL (ref 1.5–6.5)
NEUT%: 71.5 % (ref 39.0–75.0)
PLATELETS: 183 10*3/uL (ref 140–400)
RBC: 4.95 10*6/uL (ref 4.20–5.82)
RDW: 16.6 % — AB (ref 11.0–14.6)
WBC: 5.9 10*3/uL (ref 4.0–10.3)
lymph#: 1.1 10*3/uL (ref 0.9–3.3)

## 2014-12-21 LAB — COMPREHENSIVE METABOLIC PANEL (CC13)
ALK PHOS: 87 U/L (ref 40–150)
ALT: 12 U/L (ref 0–55)
AST: 20 U/L (ref 5–34)
Albumin: 3.3 g/dL — ABNORMAL LOW (ref 3.5–5.0)
Anion Gap: 8 mEq/L (ref 3–11)
BUN: 11.1 mg/dL (ref 7.0–26.0)
CHLORIDE: 110 meq/L — AB (ref 98–109)
CO2: 23 mEq/L (ref 22–29)
CREATININE: 0.9 mg/dL (ref 0.7–1.3)
Calcium: 9 mg/dL (ref 8.4–10.4)
GLUCOSE: 123 mg/dL (ref 70–140)
Potassium: 3.5 mEq/L (ref 3.5–5.1)
Sodium: 141 mEq/L (ref 136–145)
TOTAL PROTEIN: 6.4 g/dL (ref 6.4–8.3)
Total Bilirubin: 0.33 mg/dL (ref 0.20–1.20)

## 2014-12-21 MED ORDER — FAMOTIDINE IN NACL 20-0.9 MG/50ML-% IV SOLN
INTRAVENOUS | Status: AC
  Filled 2014-12-21: qty 50

## 2014-12-21 MED ORDER — DIPHENHYDRAMINE HCL 50 MG/ML IJ SOLN
50.0000 mg | Freq: Once | INTRAMUSCULAR | Status: AC
  Administered 2014-12-21: 50 mg via INTRAVENOUS

## 2014-12-21 MED ORDER — SODIUM CHLORIDE 0.9 % IV SOLN
Freq: Once | INTRAVENOUS | Status: AC
  Administered 2014-12-21: 11:00:00 via INTRAVENOUS
  Filled 2014-12-21: qty 8

## 2014-12-21 MED ORDER — FAMOTIDINE IN NACL 20-0.9 MG/50ML-% IV SOLN
20.0000 mg | Freq: Once | INTRAVENOUS | Status: AC
  Administered 2014-12-21: 20 mg via INTRAVENOUS

## 2014-12-21 MED ORDER — SODIUM CHLORIDE 0.9 % IV SOLN
Freq: Once | INTRAVENOUS | Status: AC
  Administered 2014-12-21: 11:00:00 via INTRAVENOUS

## 2014-12-21 MED ORDER — SODIUM CHLORIDE 0.9 % IV SOLN
493.0000 mg | Freq: Once | INTRAVENOUS | Status: AC
  Administered 2014-12-21: 490 mg via INTRAVENOUS
  Filled 2014-12-21: qty 49

## 2014-12-21 MED ORDER — DIPHENHYDRAMINE HCL 50 MG/ML IJ SOLN
INTRAMUSCULAR | Status: AC
  Filled 2014-12-21: qty 1

## 2014-12-21 MED ORDER — PACLITAXEL CHEMO INJECTION 300 MG/50ML
175.0000 mg/m2 | Freq: Once | INTRAVENOUS | Status: AC
  Administered 2014-12-21: 336 mg via INTRAVENOUS
  Filled 2014-12-21: qty 56

## 2014-12-21 NOTE — Patient Instructions (Addendum)
[name] Discharge Instructions for Patients Receiving Chemotherapy  Today you received the following chemotherapy agents Taxol/Carboplatin  To help prevent nausea and vomiting after your treatment, we encourage you to take your nausea medication     If you develop nausea and vomiting that is not controlled by your nausea medication, call the clinic.   BELOW ARE SYMPTOMS THAT SHOULD BE REPORTED IMMEDIATELY:  *FEVER GREATER THAN 100.5 F  *CHILLS WITH OR WITHOUT FEVER  NAUSEA AND VOMITING THAT IS NOT CONTROLLED WITH YOUR NAUSEA MEDICATION  *UNUSUAL SHORTNESS OF BREATH  *UNUSUAL BRUISING OR BLEEDING  TENDERNESS IN MOUTH AND THROAT WITH OR WITHOUT PRESENCE OF ULCERS  *URINARY PROBLEMS  *BOWEL PROBLEMS  UNUSUAL RASH Items with * indicate a potential emergency and should be followed up as soon as possible.  Feel free to call the clinic you have any questions or concerns. The clinic phone number is [phone].  Please show the [facility] at check-in to the Emergency Department and triage nurse.  Paclitaxel injection What is this medicine? PACLITAXEL (PAK li TAX el) is a chemotherapy drug. It targets fast dividing cells, like cancer cells, and causes these cells to die. This medicine is used to treat ovarian cancer, breast cancer, and other cancers. This medicine may be used for other purposes; ask your health care provider or pharmacist if you have questions. COMMON BRAND NAME(S): Onxol, Taxol What should I tell my health care provider before I take this medicine? They need to know if you have any of these conditions: -blood disorders -irregular heartbeat -infection (especially a virus infection such as chickenpox, cold sores, or herpes) -liver disease -previous or ongoing radiation therapy -an unusual or allergic reaction to paclitaxel, alcohol, polyoxyethylated castor oil, other chemotherapy agents, other medicines, foods, dyes, or  preservatives -pregnant or trying to get pregnant -breast-feeding How should I use this medicine? This drug is given as an infusion into a vein. It is administered in a hospital or clinic by a specially trained health care professional. Talk to your pediatrician regarding the use of this medicine in children. Special care may be needed. Overdosage: If you think you have taken too much of this medicine contact a poison control center or emergency room at once. NOTE: This medicine is only for you. Do not share this medicine with others. What if I miss a dose? It is important not to miss your dose. Call your doctor or health care professional if you are unable to keep an appointment. What may interact with this medicine? Do not take this medicine with any of the following medications: -disulfiram -metronidazole This medicine may also interact with the following medications: -cyclosporine -diazepam -ketoconazole -medicines to increase blood counts like filgrastim, pegfilgrastim, sargramostim -other chemotherapy drugs like cisplatin, doxorubicin, epirubicin, etoposide, teniposide, vincristine -quinidine -testosterone -vaccines -verapamil Talk to your doctor or health care professional before taking any of these medicines: -acetaminophen -aspirin -ibuprofen -ketoprofen -naproxen This list may not describe all possible interactions. Give your health care provider a list of all the medicines, herbs, non-prescription drugs, or dietary supplements you use. Also tell them if you smoke, drink alcohol, or use illegal drugs. Some items may interact with your medicine. What should I watch for while using this medicine? Your condition will be monitored carefully while you are receiving this medicine. You will need important blood work done while you are taking this medicine. This drug may make you feel generally unwell. This is not uncommon, as chemotherapy can affect healthy  cells as well as cancer  cells. Report any side effects. Continue your course of treatment even though you feel ill unless your doctor tells you to stop. In some cases, you may be given additional medicines to help with side effects. Follow all directions for their use. Call your doctor or health care professional for advice if you get a fever, chills or sore throat, or other symptoms of a cold or flu. Do not treat yourself. This drug decreases your body's ability to fight infections. Try to avoid being around people who are sick. This medicine may increase your risk to bruise or bleed. Call your doctor or health care professional if you notice any unusual bleeding. Be careful brushing and flossing your teeth or using a toothpick because you may get an infection or bleed more easily. If you have any dental work done, tell your dentist you are receiving this medicine. Avoid taking products that contain aspirin, acetaminophen, ibuprofen, naproxen, or ketoprofen unless instructed by your doctor. These medicines may hide a fever. Do not become pregnant while taking this medicine. Women should inform their doctor if they wish to become pregnant or think they might be pregnant. There is a potential for serious side effects to an unborn child. Talk to your health care professional or pharmacist for more information. Do not breast-feed an infant while taking this medicine. Men are advised not to father a child while receiving this medicine. What side effects may I notice from receiving this medicine? Side effects that you should report to your doctor or health care professional as soon as possible: -allergic reactions like skin rash, itching or hives, swelling of the face, lips, or tongue -low blood counts - This drug may decrease the number of white blood cells, red blood cells and platelets. You may be at increased risk for infections and bleeding. -signs of infection - fever or chills, cough, sore throat, pain or difficulty passing  urine -signs of decreased platelets or bleeding - bruising, pinpoint red spots on the skin, black, tarry stools, nosebleeds -signs of decreased red blood cells - unusually weak or tired, fainting spells, lightheadedness -breathing problems -chest pain -high or low blood pressure -mouth sores -nausea and vomiting -pain, swelling, redness or irritation at the injection site -pain, tingling, numbness in the hands or feet -slow or irregular heartbeat -swelling of the ankle, feet, hands Side effects that usually do not require medical attention (report to your doctor or health care professional if they continue or are bothersome): -bone pain -complete hair loss including hair on your head, underarms, pubic hair, eyebrows, and eyelashes -changes in the color of fingernails -diarrhea -loosening of the fingernails -loss of appetite -muscle or joint pain -red flush to skin -sweating This list may not describe all possible side effects. Call your doctor for medical advice about side effects. You may report side effects to FDA at 1-800-FDA-1088. Where should I keep my medicine? This drug is given in a hospital or clinic and will not be stored at home. NOTE: This sheet is a summary. It may not cover all possible information. If you have questions about this medicine, talk to your doctor, pharmacist, or health care provider.  2015, Elsevier/Gold Standard. (2012-07-22 16:41:21)  Carboplatin injection What is this medicine? CARBOPLATIN (KAR boe pla tin) is a chemotherapy drug. It targets fast dividing cells, like cancer cells, and causes these cells to die. This medicine is used to treat ovarian cancer and many other cancers. This medicine may be used for  other purposes; ask your health care provider or pharmacist if you have questions. COMMON BRAND NAME(S): Paraplatin What should I tell my health care provider before I take this medicine? They need to know if you have any of these  conditions: -blood disorders -hearing problems -kidney disease -recent or ongoing radiation therapy -an unusual or allergic reaction to carboplatin, cisplatin, other chemotherapy, other medicines, foods, dyes, or preservatives -pregnant or trying to get pregnant -breast-feeding How should I use this medicine? This drug is usually given as an infusion into a vein. It is administered in a hospital or clinic by a specially trained health care professional. Talk to your pediatrician regarding the use of this medicine in children. Special care may be needed. Overdosage: If you think you have taken too much of this medicine contact a poison control center or emergency room at once. NOTE: This medicine is only for you. Do not share this medicine with others. What if I miss a dose? It is important not to miss a dose. Call your doctor or health care professional if you are unable to keep an appointment. What may interact with this medicine? -medicines for seizures -medicines to increase blood counts like filgrastim, pegfilgrastim, sargramostim -some antibiotics like amikacin, gentamicin, neomycin, streptomycin, tobramycin -vaccines Talk to your doctor or health care professional before taking any of these medicines: -acetaminophen -aspirin -ibuprofen -ketoprofen -naproxen This list may not describe all possible interactions. Give your health care provider a list of all the medicines, herbs, non-prescription drugs, or dietary supplements you use. Also tell them if you smoke, drink alcohol, or use illegal drugs. Some items may interact with your medicine. What should I watch for while using this medicine? Your condition will be monitored carefully while you are receiving this medicine. You will need important blood work done while you are taking this medicine. This drug may make you feel generally unwell. This is not uncommon, as chemotherapy can affect healthy cells as well as cancer cells. Report  any side effects. Continue your course of treatment even though you feel ill unless your doctor tells you to stop. In some cases, you may be given additional medicines to help with side effects. Follow all directions for their use. Call your doctor or health care professional for advice if you get a fever, chills or sore throat, or other symptoms of a cold or flu. Do not treat yourself. This drug decreases your body's ability to fight infections. Try to avoid being around people who are sick. This medicine may increase your risk to bruise or bleed. Call your doctor or health care professional if you notice any unusual bleeding. Be careful brushing and flossing your teeth or using a toothpick because you may get an infection or bleed more easily. If you have any dental work done, tell your dentist you are receiving this medicine. Avoid taking products that contain aspirin, acetaminophen, ibuprofen, naproxen, or ketoprofen unless instructed by your doctor. These medicines may hide a fever. Do not become pregnant while taking this medicine. Women should inform their doctor if they wish to become pregnant or think they might be pregnant. There is a potential for serious side effects to an unborn child. Talk to your health care professional or pharmacist for more information. Do not breast-feed an infant while taking this medicine. What side effects may I notice from receiving this medicine? Side effects that you should report to your doctor or health care professional as soon as possible: -allergic reactions like skin rash, itching  or hives, swelling of the face, lips, or tongue -signs of infection - fever or chills, cough, sore throat, pain or difficulty passing urine -signs of decreased platelets or bleeding - bruising, pinpoint red spots on the skin, black, tarry stools, nosebleeds -signs of decreased red blood cells - unusually weak or tired, fainting spells, lightheadedness -breathing  problems -changes in hearing -changes in vision -chest pain -high blood pressure -low blood counts - This drug may decrease the number of white blood cells, red blood cells and platelets. You may be at increased risk for infections and bleeding. -nausea and vomiting -pain, swelling, redness or irritation at the injection site -pain, tingling, numbness in the hands or feet -problems with balance, talking, walking -trouble passing urine or change in the amount of urine Side effects that usually do not require medical attention (report to your doctor or health care professional if they continue or are bothersome): -hair loss -loss of appetite -metallic taste in the mouth or changes in taste This list may not describe all possible side effects. Call your doctor for medical advice about side effects. You may report side effects to FDA at 1-800-FDA-1088. Where should I keep my medicine? This drug is given in a hospital or clinic and will not be stored at home. NOTE: This sheet is a summary. It may not cover all possible information. If you have questions about this medicine, talk to your doctor, pharmacist, or health care provider.  2015, Elsevier/Gold Standard. (2007-09-03 14:38:05)

## 2014-12-21 NOTE — Telephone Encounter (Signed)
Pt confirmed labs/ov per 07/11 POF, gave pt AVS and Calendar.... KJ °

## 2014-12-21 NOTE — Progress Notes (Signed)
[facility] [phone]   [phone]  OFFICE PROGRESS NOTE  Olga Millers, MD [place] [address]  PRINCIPAL DIAGNOSES:  1. 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 2. Stage IA non-small-cell lung cancer diagnosed in June 2011.  PRIOR THERAPY:  1. Status post total laryngectomy with. Bilateral selective lymph node dissection as well as left superior parotidectomy with nerve dissection under the care of Dr. Redmond Baseman with positive resection margin. 2. Status post course of concurrent chemoradiation with weekly cisplatin. Last dose of chemotherapy was given January 08, 2007. 3. Status post curative radiotherapy with tomotherapy to the lung lesions completed December 31, 2009 under the care of Dr. Pablo Ledger.  CURRENT THERAPY: The patient is here to begin systemic chemotherapy with carboplatin for an AUC of 5 and paclitaxel 175 mg meter squared.  INTERVAL HISTORY: [name] 74 y.o. male returns to the clinic today with his wife prior to his first cycle of carboplatin and paclitaxel. The patient has no significant change since his last visit. The patient is feeling fine today with no specific complaints. He denied having any significant chest pain, shortness of breath, cough or hemoptysis. He has no weight loss or night sweats. He is ready to begin his chemotherapy today.  MEDICAL HISTORY: Past Medical History  Diagnosis Date  . Hypertension   . 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 Cancer 2009    SUPERGLOTTIS INVASIVE SQ CELL CA    ALLERGIES:  has No Known Allergies.  MEDICATIONS:  Current Outpatient Prescriptions  Medication Sig Dispense Refill  . albuterol (PROVENTIL) (2.5 MG/3ML) 0.083% nebulizer solution Take 2.5 mg by nebulization every 6 (six) hours as needed for wheezing or shortness of breath.    Marland Kitchen amLODipine (NORVASC) 5 MG tablet Take 1 tablet (5 mg total) by mouth  at bedtime. 30 tablet 3  . aspirin 81 MG tablet Take 81 mg by mouth every morning.     . baclofen (LIORESAL) 10 MG tablet Take 1 tablet (10 mg total) by mouth at bedtime. 30 each 1  . cholecalciferol (VITAMIN D) 1000 UNITS tablet Take 1,000 Units by mouth every morning.     . finasteride (PROSCAR) 5 MG tablet Take 1 tablet (5 mg total) by mouth every morning. 30 tablet 1  . metoprolol succinate (TOPROL-XL) 25 MG 24 hr tablet Take 1 tablet (25 mg total) by mouth every morning. 30 tablet 1  . prochlorperazine (COMPAZINE) 10 MG tablet Take 1 tablet (10 mg total) by mouth every 6 (six) hours as needed for nausea or vomiting. 30 tablet 0  . simvastatin (ZOCOR) 20 MG tablet Take 1 tablet (20 mg total) by mouth every evening. 30 tablet 1  . tiZANidine (ZANAFLEX) 4 MG tablet Take 1 tablet (4 mg total) by mouth 3 (three) times daily as needed for muscle spasms. 90 tablet 1   No current facility-administered medications for this visit.   Facility-Administered Medications Ordered in Other Visits  Medication Dose Route Frequency Provider Last Rate Last Dose  . CARBOplatin (PARAPLATIN) 490 mg in sodium chloride 0.9 % 250 mL chemo infusion  490 mg Intravenous Once Curt Bears, MD      . PACLitaxel (TAXOL) 336 mg in dextrose 5 % 500 mL chemo infusion (> '80mg'$ /m2)  175 mg/m2 (Treatment Plan Actual) Intravenous Once Curt Bears, MD 46 mL/hr at 12/21/14 1145 336 mg at 12/21/14 1145  REVIEW OF SYSTEMS:  A comprehensive review of systems was negative.   PHYSICAL EXAMINATION: General appearance: alert, cooperative and no distress Head: Normocephalic, without obvious abnormality, atraumatic Neck: no adenopathy Lymph nodes: Cervical, supraclavicular, and axillary nodes normal. Resp: clear to auscultation bilaterally Cardio: regular rate and rhythm, S1, S2 normal, no murmur, click, rub or gallop GI: soft, non-tender; bowel sounds normal; no masses,  no organomegaly Extremities: extremities normal,  atraumatic, no cyanosis or edema  ECOG PERFORMANCE STATUS: 1 - Symptomatic but completely ambulatory  Blood pressure 122/72, pulse 64, temperature 99.6 F (37.6 C), temperature source Oral, resp. rate 18, height '5\' 6"'$  (1.676 m), weight 174 lb 12.8 oz (79.289 kg), SpO2 99 %.  LABORATORY DATA: Lab Results  Component Value Date   WBC 5.9 12/21/2014   HGB 12.7* 12/21/2014   HCT 38.8 12/21/2014   MCV 78.4* 12/21/2014   PLT 183 12/21/2014      Chemistry      Component Value Date/Time   NA 141 12/21/2014 0820   NA 139 01/19/2012 0942   NA 141 03/31/2010 1005   K 3.5 12/21/2014 0820   K 4.1 01/19/2012 0942   K 3.7 03/31/2010 1005   CL 103 08/02/2012 1107   CL 96* 01/19/2012 0942   CL 105 03/31/2010 1005   CO2 23 12/21/2014 0820   CO2 28 01/19/2012 0942   CO2 31 03/31/2010 1005   BUN 11.1 12/21/2014 0820   BUN 14 01/19/2012 0942   BUN 12 03/31/2010 1005   CREATININE 0.9 12/21/2014 0820   CREATININE 1.2 01/19/2012 0942   CREATININE 1.02 03/31/2010 1005      Component Value Date/Time   CALCIUM 9.0 12/21/2014 0820   CALCIUM 9.5 01/19/2012 0942   CALCIUM 9.0 03/31/2010 1005   ALKPHOS 87 12/21/2014 0820   ALKPHOS 64 01/19/2012 0942   ALKPHOS 60 03/31/2010 1005   AST 20 12/21/2014 0820   AST 27 01/19/2012 0942   AST 21 03/31/2010 1005   ALT 12 12/21/2014 0820   ALT 23 01/19/2012 0942   ALT 19 03/31/2010 1005   BILITOT 0.33 12/21/2014 0820   BILITOT 0.50 01/19/2012 0942   BILITOT 0.4 03/31/2010 1005     PATHOLOGY: Diagnosis Liver, needle/core biopsy, right inferior hepatic - NON-SMALL CELL CARCINOMA. Microscopic Comment The core biopsies of liver are extensively involved by non-small cell carcinoma consistent with metastatic carcinoma. Immunohistochemistry will be performed and reported as an addendum. (JDP:ecj 10/16/2014) ADDENDUM: Immunohistochemistry is performed and the tumor is strongly positive with MOC-31 and shows patchy positivity with cytokeratin 7 and p16  (HPV). The tumor is negative with cytokeratin 5/6, cytokeratin 903, p63, thyroid transcription factor-1 and CDX-2. The immunophenotype is non specific. 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 (JDP:gt, 10/19/14) Claudette Laws MD Pathologist, Electronic Signature (Case signed 10/16/2014) Corrected Report Signer Claudette Laws MD Pathologist, Electronic Signature (Case signed 10/19/2014) Specimen Gross and Clinical Information  RADIOGRAPHIC STUDIES: No results found.  ASSESSMENT AND PLAN: This is a very pleasant 74 year old African American male with history of stage IVC supraglottic invasive squamous cell carcinoma diagnosed in May of 2008 in addition to a stage IA non-small cell lung cancer diagnosed in June of 2011 status post resection as well as concurrent chemoradiation for the laryngeal carcinoma and curative radiotherapy to the lung lesion. The patient has been observation since July of 2011 with no evidence for disease  recurrence over the last few years. The recent CT scan of the chest showed interval progression of disease especially in the liver as well as small pulmonary nodules in the left upper lobe and continued increase in the size of the tail of the pancreas worrisome for underlying malignancy. The patient had previous biopsy in the past that showed no evidence of malignancy but this was few years ago. Biopsy performed in May 2016 is consistent with metastatic cancer.  Patient seen and examined with Dr. Julien Nordmann. Recommend that the patient begin treatment with systemic chemotherapy with Carboplatin for an AUC of 5 and paclitaxel 175 MG/M2 given every 3 weeks. Side effects discussed including fatigue, alopecia, nausea, and vomiting.   The patient will have weekly labs and be seen for follow-up prior to cycle 2 in 3 weeks.  Plan is for 3  cycles and then restaging to see how he responds. We may consider immunotherapy in the future.   He was advised to call immediately if he has any concerning symptoms in the interval the  All questions were answered. The patient knows to call the clinic with any problems, questions or concerns. We can certainly see the patient much sooner if necessary.  Mikey Bussing, DNP, AGPCNP-BC, AOCNP   ADDENDUM: Hematology/Oncology Attending: I had a face to face encounter with the patient today. I recommended his care plan. This is a very pleasant 74 years old African-American male with metastatic non-small cell lung cancer, squamous cell carcinoma with liver metastasis. The patient is here today to start the first cycle of systemic chemotherapy with carboplatin and paclitaxel. He is feeling fine and denied having any specific complaints. We will proceed with cycle #1 today as a scheduled. The patient would come back for follow-up visit in 3 weeks for reevaluation before starting cycle #2. He was advised to call immediately if he has any concerning symptoms in the interval.  Disclaimer: This note was dictated with voice recognition software. Similar sounding words can inadvertently be transcribed and may be missed upon review. Eilleen Kempf., MD 12/21/2014

## 2014-12-22 ENCOUNTER — Telehealth: Payer: Self-pay

## 2014-12-22 ENCOUNTER — Telehealth: Payer: Self-pay | Admitting: *Deleted

## 2014-12-22 ENCOUNTER — Ambulatory Visit: Payer: Medicare Other

## 2014-12-22 NOTE — Telephone Encounter (Signed)
-----   Message from Azzie Glatter, RN sent at 12/21/2014  4:20 PM EDT ----- Regarding: "1st time chemotherapy" Patient received Taxol/Carboplatin for the 1st time today, per Dr. Julien Nordmann.  Tolerated treatment well with no complaints.

## 2014-12-22 NOTE — Telephone Encounter (Signed)
I have called and moved appt from today to tomorrow. Patient aware

## 2014-12-22 NOTE — Telephone Encounter (Signed)
S/w wife. She states pt is doing well. No n/v/d, no fever. Instructed to call if any questions or concerns. Stated she understood why neulasta inj appt was changed from today to tomorrow.

## 2014-12-23 ENCOUNTER — Encounter: Payer: Self-pay | Admitting: Internal Medicine

## 2014-12-23 ENCOUNTER — Ambulatory Visit (HOSPITAL_BASED_OUTPATIENT_CLINIC_OR_DEPARTMENT_OTHER): Payer: Medicare Other

## 2014-12-23 VITALS — BP 98/58 | HR 83 | Temp 97.8°F

## 2014-12-23 DIAGNOSIS — C3412 Malignant neoplasm of upper lobe, left bronchus or lung: Secondary | ICD-10-CM | POA: Diagnosis not present

## 2014-12-23 DIAGNOSIS — Z5189 Encounter for other specified aftercare: Secondary | ICD-10-CM

## 2014-12-23 DIAGNOSIS — C3410 Malignant neoplasm of upper lobe, unspecified bronchus or lung: Secondary | ICD-10-CM

## 2014-12-23 MED ORDER — PEGFILGRASTIM INJECTION 6 MG/0.6ML ~~LOC~~
6.0000 mg | PREFILLED_SYRINGE | Freq: Once | SUBCUTANEOUS | Status: AC
  Administered 2014-12-23: 6 mg via SUBCUTANEOUS
  Filled 2014-12-23: qty 0.6

## 2014-12-23 NOTE — Patient Instructions (Signed)
Pegfilgrastim injection What is this medicine? PEGFILGRASTIM (peg fil GRA stim) is a long-acting granulocyte colony-stimulating factor that stimulates the growth of neutrophils, a type of white blood cell important in the body's fight against infection. It is used to reduce the incidence of fever and infection in patients with certain types of cancer who are receiving chemotherapy that affects the bone marrow. This medicine may be used for other purposes; ask your health care provider or pharmacist if you have questions. COMMON BRAND NAME(S): Neulasta What should I tell my health care provider before I take this medicine? They need to know if you have any of these conditions: -latex allergy -ongoing radiation therapy -sickle cell disease -skin reactions to acrylic adhesives (On-Body Injector only) -an unusual or allergic reaction to pegfilgrastim, filgrastim, other medicines, foods, dyes, or preservatives -pregnant or trying to get pregnant -breast-feeding How should I use this medicine? This medicine is for injection under the skin. If you get this medicine at home, you will be taught how to prepare and give the pre-filled syringe or how to use the On-body Injector. Refer to the patient Instructions for Use for detailed instructions. Use exactly as directed. Take your medicine at regular intervals. Do not take your medicine more often than directed. It is important that you put your used needles and syringes in a special sharps container. Do not put them in a trash can. If you do not have a sharps container, call your pharmacist or healthcare provider to get one. Talk to your pediatrician regarding the use of this medicine in children. Special care may be needed. Overdosage: If you think you have taken too much of this medicine contact a poison control center or emergency room at once. NOTE: This medicine is only for you. Do not share this medicine with others. What if I miss a dose? It is  important not to miss your dose. Call your doctor or health care professional if you miss your dose. If you miss a dose due to an On-body Injector failure or leakage, a new dose should be administered as soon as possible using a single prefilled syringe for manual use. What may interact with this medicine? Interactions have not been studied. Give your health care provider a list of all the medicines, herbs, non-prescription drugs, or dietary supplements you use. Also tell them if you smoke, drink alcohol, or use illegal drugs. Some items may interact with your medicine. This list may not describe all possible interactions. Give your health care provider a list of all the medicines, herbs, non-prescription drugs, or dietary supplements you use. Also tell them if you smoke, drink alcohol, or use illegal drugs. Some items may interact with your medicine. What should I watch for while using this medicine? You may need blood work done while you are taking this medicine. If you are going to need a MRI, CT scan, or other procedure, tell your doctor that you are using this medicine (On-Body Injector only). What side effects may I notice from receiving this medicine? Side effects that you should report to your doctor or health care professional as soon as possible: -allergic reactions like skin rash, itching or hives, swelling of the face, lips, or tongue -dizziness -fever -pain, redness, or irritation at site where injected -pinpoint red spots on the skin -shortness of breath or breathing problems -stomach or side pain, or pain at the shoulder -swelling -tiredness -trouble passing urine Side effects that usually do not require medical attention (report to your doctor   or health care professional if they continue or are bothersome): -bone pain -muscle pain This list may not describe all possible side effects. Call your doctor for medical advice about side effects. You may report side effects to FDA at  1-800-FDA-1088. Where should I keep my medicine? Keep out of the reach of children. Store pre-filled syringes in a refrigerator between 2 and 8 degrees C (36 and 46 degrees F). Do not freeze. Keep in carton to protect from light. Throw away this medicine if it is left out of the refrigerator for more than 48 hours. Throw away any unused medicine after the expiration date. NOTE: This sheet is a summary. It may not cover all possible information. If you have questions about this medicine, talk to your doctor, pharmacist, or health care provider.  2015, [employer]vier/[employer]. (2013-08-28 16:14:05)  

## 2014-12-23 NOTE — Progress Notes (Signed)
I placed fmla forms for garnet wife of Decker on the desk of nurse for dr. Mckinley Jewel

## 2014-12-24 ENCOUNTER — Encounter: Payer: Self-pay | Admitting: Internal Medicine

## 2014-12-24 NOTE — Progress Notes (Signed)
I faxed fmla for wife [name] to  [phone] and will mail copy to patient

## 2014-12-25 ENCOUNTER — Telehealth: Payer: Self-pay | Admitting: *Deleted

## 2014-12-25 NOTE — Telephone Encounter (Signed)
TC from patient's wife. She states that her husband had chemo on Monday, July 11 (Taxol/Carbo) and neulasta injection on Tuesday, July 12. On Thursday he developed fever of 103.5 and felt 'bad'. She gave him tylenol and temp came down to 99.4. Then this morning it was 100.1 and 100.  Current temp is 99.4. Has no appetite, has not been eating much. Only 1 bowl of ice cream today. And he has not had as much fluids as he needs. Reviewed use of Tylenol and that he needs to push fluids-at least 64 oz per day.   Denies nausea/vomiting or diarrhea. He states he feels extremely fatigued. Advised that he may need to go to the ED. Wife told him that while on the phone with this RN. He declined to go. Instructed him and his wife on fluids, tyelnol. Instructed to go to ED should temperature go back up to > 100.5 or any new symptoms arise such as lightedness, dizzyness, worsening weakness, dark amber urine, confusion etc.  Wife verbalized understanding. She is aware to call [facility] at any time. For now she will keep him home, push fluids and give tylenol every 6 hours as needed for temp.

## 2014-12-28 ENCOUNTER — Other Ambulatory Visit (HOSPITAL_BASED_OUTPATIENT_CLINIC_OR_DEPARTMENT_OTHER): Payer: Medicare Other

## 2014-12-28 DIAGNOSIS — C3412 Malignant neoplasm of upper lobe, left bronchus or lung: Secondary | ICD-10-CM

## 2014-12-28 DIAGNOSIS — C3410 Malignant neoplasm of upper lobe, unspecified bronchus or lung: Secondary | ICD-10-CM

## 2014-12-28 LAB — COMPREHENSIVE METABOLIC PANEL (CC13)
ALK PHOS: 89 U/L (ref 40–150)
ALT: 16 U/L (ref 0–55)
AST: 20 U/L (ref 5–34)
Albumin: 3.4 g/dL — ABNORMAL LOW (ref 3.5–5.0)
Anion Gap: 8 mEq/L (ref 3–11)
BUN: 13.2 mg/dL (ref 7.0–26.0)
CALCIUM: 9 mg/dL (ref 8.4–10.4)
CHLORIDE: 104 meq/L (ref 98–109)
CO2: 26 mEq/L (ref 22–29)
Creatinine: 0.8 mg/dL (ref 0.7–1.3)
EGFR: >90 mL/min/{1.73_m2} (ref >90)
Glucose: 103 mg/dl (ref 70–140)
Potassium: 3.8 mEq/L (ref 3.5–5.1)
Sodium: 138 mEq/L (ref 136–145)
Total Bilirubin: 0.41 mg/dL (ref 0.20–1.20)
Total Protein: 6.6 g/dL (ref 6.4–8.3)

## 2014-12-28 LAB — CBC WITH DIFFERENTIAL/PLATELET
BASO%: 0.6 % (ref 0.0–2.0)
Basophils Absolute: 0 10*3/uL (ref 0.0–0.1)
EOS ABS: 0 10*3/uL (ref 0.0–0.5)
EOS%: 0.5 % (ref 0.0–7.0)
HCT: 39.5 % (ref 38.4–49.9)
HGB: 12.8 g/dL — ABNORMAL LOW (ref 13.0–17.1)
LYMPH#: 0.5 10*3/uL — AB (ref 0.9–3.3)
LYMPH%: 10.8 % — AB (ref 14.0–49.0)
MCH: 24.7 pg — ABNORMAL LOW (ref 27.2–33.4)
MCHC: 32.3 g/dL (ref 32.0–36.0)
MCV: 76.4 fL — ABNORMAL LOW (ref 79.3–98.0)
MONO#: 0.8 10*3/uL (ref 0.1–0.9)
MONO%: 18 % — ABNORMAL HIGH (ref 0.0–14.0)
NEUT%: 70.1 % (ref 39.0–75.0)
NEUTROS ABS: 3 10*3/uL (ref 1.5–6.5)
Platelets: 153 10*3/uL (ref 140–400)
RBC: 5.17 10*6/uL (ref 4.20–5.82)
RDW: 17.9 % — ABNORMAL HIGH (ref 11.0–14.6)
WBC: 4.2 10*3/uL (ref 4.0–10.3)

## 2015-01-04 ENCOUNTER — Other Ambulatory Visit: Payer: Self-pay | Admitting: Geriatric Medicine

## 2015-01-04 ENCOUNTER — Other Ambulatory Visit (HOSPITAL_BASED_OUTPATIENT_CLINIC_OR_DEPARTMENT_OTHER): Payer: Medicare Other

## 2015-01-04 DIAGNOSIS — K769 Liver disease, unspecified: Secondary | ICD-10-CM

## 2015-01-04 DIAGNOSIS — Z8673 Personal history of transient ischemic attack (TIA), and cerebral infarction without residual deficits: Secondary | ICD-10-CM

## 2015-01-04 DIAGNOSIS — C3410 Malignant neoplasm of upper lobe, unspecified bronchus or lung: Secondary | ICD-10-CM

## 2015-01-04 DIAGNOSIS — C3412 Malignant neoplasm of upper lobe, left bronchus or lung: Secondary | ICD-10-CM | POA: Diagnosis not present

## 2015-01-04 DIAGNOSIS — I10 Essential (primary) hypertension: Secondary | ICD-10-CM

## 2015-01-04 DIAGNOSIS — C329 Malignant neoplasm of larynx, unspecified: Secondary | ICD-10-CM

## 2015-01-04 LAB — CBC WITH DIFFERENTIAL/PLATELET
BASO%: 0.8 % (ref 0.0–2.0)
BASOS ABS: 0.1 10*3/uL (ref 0.0–0.1)
EOS%: 0.2 % (ref 0.0–7.0)
Eosinophils Absolute: 0 10*3/uL (ref 0.0–0.5)
HCT: 41.6 % (ref 38.4–49.9)
HEMOGLOBIN: 13.6 g/dL (ref 13.0–17.1)
LYMPH%: 12.1 % — ABNORMAL LOW (ref 14.0–49.0)
MCH: 25.2 pg — ABNORMAL LOW (ref 27.2–33.4)
MCHC: 32.7 g/dL (ref 32.0–36.0)
MCV: 77.1 fL — ABNORMAL LOW (ref 79.3–98.0)
MONO#: 0.4 10*3/uL (ref 0.1–0.9)
MONO%: 3.9 % (ref 0.0–14.0)
NEUT%: 83 % — ABNORMAL HIGH (ref 39.0–75.0)
NEUTROS ABS: 8.4 10*3/uL — AB (ref 1.5–6.5)
Platelets: 221 10*3/uL (ref 140–400)
RBC: 5.39 10*6/uL (ref 4.20–5.82)
RDW: 18.4 % — ABNORMAL HIGH (ref 11.0–14.6)
WBC: 10.1 10*3/uL (ref 4.0–10.3)
lymph#: 1.2 10*3/uL (ref 0.9–3.3)

## 2015-01-04 LAB — COMPREHENSIVE METABOLIC PANEL (CC13)
ALK PHOS: 128 U/L (ref 40–150)
ALT: 18 U/L (ref 0–55)
AST: 24 U/L (ref 5–34)
Albumin: 3.5 g/dL (ref 3.5–5.0)
Anion Gap: 10 mEq/L (ref 3–11)
BUN: 8.7 mg/dL (ref 7.0–26.0)
CALCIUM: 9.1 mg/dL (ref 8.4–10.4)
CO2: 25 meq/L (ref 22–29)
Chloride: 106 mEq/L (ref 98–109)
Creatinine: 0.8 mg/dL (ref 0.7–1.3)
EGFR: >90 mL/min/{1.73_m2} (ref >90)
Glucose: 103 mg/dl (ref 70–140)
POTASSIUM: 3.7 meq/L (ref 3.5–5.1)
SODIUM: 141 meq/L (ref 136–145)
TOTAL PROTEIN: 6.9 g/dL (ref 6.4–8.3)
Total Bilirubin: 0.39 mg/dL (ref 0.20–1.20)

## 2015-01-04 MED ORDER — METOPROLOL SUCCINATE ER 25 MG PO TB24: 25.0000 mg | ORAL_TABLET | Freq: Every morning | ORAL | Status: DC

## 2015-01-04 MED ORDER — FINASTERIDE 5 MG PO TABS: 5.0000 mg | ORAL_TABLET | Freq: Every morning | ORAL | Status: DC

## 2015-01-11 ENCOUNTER — Other Ambulatory Visit: Payer: Self-pay | Admitting: Geriatric Medicine

## 2015-01-11 ENCOUNTER — Other Ambulatory Visit (HOSPITAL_BASED_OUTPATIENT_CLINIC_OR_DEPARTMENT_OTHER): Payer: Medicare Other

## 2015-01-11 ENCOUNTER — Telehealth: Payer: Self-pay | Admitting: Internal Medicine

## 2015-01-11 ENCOUNTER — Encounter: Payer: Self-pay | Admitting: Physician Assistant

## 2015-01-11 ENCOUNTER — Ambulatory Visit (HOSPITAL_BASED_OUTPATIENT_CLINIC_OR_DEPARTMENT_OTHER): Payer: Medicare Other

## 2015-01-11 ENCOUNTER — Ambulatory Visit (HOSPITAL_BASED_OUTPATIENT_CLINIC_OR_DEPARTMENT_OTHER): Payer: Medicare Other | Admitting: Physician Assistant

## 2015-01-11 VITALS — BP 115/74 | HR 84 | Temp 98.3°F | Resp 18 | Ht 66.0 in | Wt 169.5 lb

## 2015-01-11 DIAGNOSIS — Z5111 Encounter for antineoplastic chemotherapy: Secondary | ICD-10-CM | POA: Diagnosis not present

## 2015-01-11 DIAGNOSIS — C3412 Malignant neoplasm of upper lobe, left bronchus or lung: Secondary | ICD-10-CM

## 2015-01-11 DIAGNOSIS — K769 Liver disease, unspecified: Secondary | ICD-10-CM

## 2015-01-11 DIAGNOSIS — Z8521 Personal history of malignant neoplasm of larynx: Secondary | ICD-10-CM | POA: Diagnosis not present

## 2015-01-11 DIAGNOSIS — C3410 Malignant neoplasm of upper lobe, unspecified bronchus or lung: Secondary | ICD-10-CM

## 2015-01-11 DIAGNOSIS — C787 Secondary malignant neoplasm of liver and intrahepatic bile duct: Secondary | ICD-10-CM

## 2015-01-11 DIAGNOSIS — C3490 Malignant neoplasm of unspecified part of unspecified bronchus or lung: Secondary | ICD-10-CM

## 2015-01-11 DIAGNOSIS — R269 Unspecified abnormalities of gait and mobility: Secondary | ICD-10-CM

## 2015-01-11 DIAGNOSIS — I10 Essential (primary) hypertension: Secondary | ICD-10-CM

## 2015-01-11 DIAGNOSIS — C329 Malignant neoplasm of larynx, unspecified: Secondary | ICD-10-CM

## 2015-01-11 LAB — COMPREHENSIVE METABOLIC PANEL (CC13)
ALBUMIN: 3.5 g/dL (ref 3.5–5.0)
ALT: 16 U/L (ref 0–55)
AST: 22 U/L (ref 5–34)
Alkaline Phosphatase: 107 U/L (ref 40–150)
Anion Gap: 9 mEq/L (ref 3–11)
BILIRUBIN TOTAL: 0.24 mg/dL (ref 0.20–1.20)
BUN: 11 mg/dL (ref 7.0–26.0)
CHLORIDE: 108 meq/L (ref 98–109)
CO2: 24 meq/L (ref 22–29)
CREATININE: 0.8 mg/dL (ref 0.7–1.3)
Calcium: 8.9 mg/dL (ref 8.4–10.4)
EGFR: >90 mL/min/{1.73_m2} (ref >90)
Glucose: 125 mg/dl (ref 70–140)
POTASSIUM: 3.5 meq/L (ref 3.5–5.1)
Sodium: 141 mEq/L (ref 136–145)
Total Protein: 6.7 g/dL (ref 6.4–8.3)

## 2015-01-11 LAB — CBC WITH DIFFERENTIAL/PLATELET
BASO%: 0.6 % (ref 0.0–2.0)
Basophils Absolute: 0 10*3/uL (ref 0.0–0.1)
EOS ABS: 0 10*3/uL (ref 0.0–0.5)
EOS%: 0.2 % (ref 0.0–7.0)
HCT: 39.6 % (ref 38.4–49.9)
HGB: 12.9 g/dL — ABNORMAL LOW (ref 13.0–17.1)
LYMPH%: 8.7 % — ABNORMAL LOW (ref 14.0–49.0)
MCH: 25.5 pg — ABNORMAL LOW (ref 27.2–33.4)
MCHC: 32.5 g/dL (ref 32.0–36.0)
MCV: 78.5 fL — ABNORMAL LOW (ref 79.3–98.0)
MONO#: 0.5 10*3/uL (ref 0.1–0.9)
MONO%: 5.9 % (ref 0.0–14.0)
NEUT%: 84.6 % — ABNORMAL HIGH (ref 39.0–75.0)
NEUTROS ABS: 7.1 10*3/uL — AB (ref 1.5–6.5)
Platelets: 270 10*3/uL (ref 140–400)
RBC: 5.04 10*6/uL (ref 4.20–5.82)
RDW: 19.1 % — ABNORMAL HIGH (ref 11.0–14.6)
WBC: 8.4 10*3/uL (ref 4.0–10.3)
lymph#: 0.7 10*3/uL — ABNORMAL LOW (ref 0.9–3.3)

## 2015-01-11 MED ORDER — BACLOFEN 10 MG PO TABS: 10.0000 mg | ORAL_TABLET | Freq: Every day | ORAL | Status: DC

## 2015-01-11 MED ORDER — SODIUM CHLORIDE 0.9 % IV SOLN
493.0000 mg | Freq: Once | INTRAVENOUS | Status: AC
  Administered 2015-01-11: 490 mg via INTRAVENOUS
  Filled 2015-01-11: qty 49

## 2015-01-11 MED ORDER — AMLODIPINE BESYLATE 5 MG PO TABS: 5.0000 mg | ORAL_TABLET | Freq: Every day | ORAL | Status: DC

## 2015-01-11 MED ORDER — FAMOTIDINE IN NACL 20-0.9 MG/50ML-% IV SOLN
20.0000 mg | Freq: Once | INTRAVENOUS | Status: AC
  Administered 2015-01-11: 20 mg via INTRAVENOUS

## 2015-01-11 MED ORDER — SODIUM CHLORIDE 0.9 % IV SOLN
Freq: Once | INTRAVENOUS | Status: AC
  Administered 2015-01-11: 11:00:00 via INTRAVENOUS
  Filled 2015-01-11: qty 8

## 2015-01-11 MED ORDER — DIPHENHYDRAMINE HCL 50 MG/ML IJ SOLN
50.0000 mg | Freq: Once | INTRAMUSCULAR | Status: AC
  Administered 2015-01-11: 50 mg via INTRAVENOUS

## 2015-01-11 MED ORDER — DIPHENHYDRAMINE HCL 50 MG/ML IJ SOLN
INTRAMUSCULAR | Status: AC
  Filled 2015-01-11: qty 1

## 2015-01-11 MED ORDER — PACLITAXEL CHEMO INJECTION 300 MG/50ML
175.0000 mg/m2 | Freq: Once | INTRAVENOUS | Status: AC
  Administered 2015-01-11: 336 mg via INTRAVENOUS
  Filled 2015-01-11: qty 56

## 2015-01-11 MED ORDER — FAMOTIDINE IN NACL 20-0.9 MG/50ML-% IV SOLN
INTRAVENOUS | Status: AC
  Filled 2015-01-11: qty 50

## 2015-01-11 MED ORDER — SODIUM CHLORIDE 0.9 % IV SOLN
Freq: Once | INTRAVENOUS | Status: AC
  Administered 2015-01-11: 11:00:00 via INTRAVENOUS

## 2015-01-11 NOTE — Progress Notes (Addendum)
[facility] [phone]   [phone]  OFFICE PROGRESS NOTE  Olga Millers, MD [place] [address]  PRINCIPAL DIAGNOSES:  1. 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 2. Stage IA non-small-cell lung cancer diagnosed in June 2011.  PRIOR THERAPY:  1. Status post total laryngectomy with. Bilateral selective lymph node dissection as well as left superior parotidectomy with nerve dissection under the care of Dr. Redmond Baseman with positive resection margin. 2. Status post course of concurrent chemoradiation with weekly cisplatin. Last dose of chemotherapy was given January 08, 2007. 3. Status post curative radiotherapy with tomotherapy to the lung lesions completed December 31, 2009 under the care of Dr. Pablo Ledger.  CURRENT THERAPY: Systemic chemotherapy with carboplatin for an AUC of 5 and paclitaxel 175 mg meter squared. Status post 1 cycle  INTERVAL HISTORY: [name] 74 y.o. male returns to the clinic today with his wife prior to starting cycle #2 of carboplatin and paclitaxel. He reports he had about 3 days where he had significantly increased fatigue as well as some achy bones and joints after the Neulasta injection. He had some mild nausea that was well-controlled with his current antiemetics. He otherwise tolerated his first cycle of chemotherapy with carboplatin and paclitaxel with Neulasta support relatively well. Today he voices no specific complaints. The patient is feeling fine today with no specific complaints. He denied having any significant chest pain, shortness of breath, cough or hemoptysis. He has no weight loss or night sweats.   MEDICAL HISTORY: Past Medical History  Diagnosis Date  . Hypertension   . 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 Cancer 2009    SUPERGLOTTIS INVASIVE SQ CELL CA    ALLERGIES:  has No Known Allergies.  MEDICATIONS:  Current Outpatient  Prescriptions  Medication Sig Dispense Refill  . albuterol (PROVENTIL) (2.5 MG/3ML) 0.083% nebulizer solution Take 2.5 mg by nebulization every 6 (six) hours as needed for wheezing or shortness of breath.    Marland Kitchen amLODipine (NORVASC) 5 MG tablet Take 1 tablet (5 mg total) by mouth at bedtime. 30 tablet 3  . aspirin 81 MG tablet Take 81 mg by mouth every morning.     . baclofen (LIORESAL) 10 MG tablet Take 1 tablet (10 mg total) by mouth at bedtime. 30 each 1  . cholecalciferol (VITAMIN D) 1000 UNITS tablet Take 1,000 Units by mouth every morning.     . finasteride (PROSCAR) 5 MG tablet Take 1 tablet (5 mg total) by mouth every morning. 90 tablet 3  . metoprolol succinate (TOPROL-XL) 25 MG 24 hr tablet Take 1 tablet (25 mg total) by mouth every morning. 90 tablet 3  . prochlorperazine (COMPAZINE) 10 MG tablet Take 1 tablet (10 mg total) by mouth every 6 (six) hours as needed for nausea or vomiting. 30 tablet 0  . simvastatin (ZOCOR) 20 MG tablet Take 1 tablet (20 mg total) by mouth every evening. 30 tablet 1  . tiZANidine (ZANAFLEX) 4 MG tablet Take 1 tablet (4 mg total) by mouth 3 (three) times daily as needed for muscle spasms. 90 tablet 1   No current facility-administered medications for this visit.    REVIEW OF SYSTEMS:  A comprehensive review of systems was negative except for: Constitutional: positive for fatigue Gastrointestinal: positive for nausea Musculoskeletal: positive for arthralgias and bone pain   PHYSICAL EXAMINATION: General appearance: alert, cooperative and no distress  Head: Normocephalic, without obvious abnormality, atraumatic Neck: no adenopathy Lymph nodes: Cervical, supraclavicular, and axillary nodes normal. Resp: clear to auscultation bilaterally Cardio: regular rate and rhythm, S1, S2 normal, no murmur, click, rub or gallop GI: soft, non-tender; bowel sounds normal; no masses,  no organomegaly Extremities: extremities normal, atraumatic, no cyanosis or  edema  ECOG PERFORMANCE STATUS: 1 - Symptomatic but completely ambulatory  Blood pressure 115/74, pulse 84, temperature 98.3 F (36.8 C), temperature source Oral, resp. rate 18, height '5\' 6"'$  (1.676 m), weight 169 lb 8 oz (76.885 kg), SpO2 99 %.  LABORATORY DATA: Lab Results  Component Value Date   WBC 8.4 01/11/2015   HGB 12.9* 01/11/2015   HCT 39.6 01/11/2015   MCV 78.5* 01/11/2015   PLT 270 01/11/2015      Chemistry      Component Value Date/Time   NA 141 01/11/2015 0834   NA 139 01/19/2012 0942   NA 141 03/31/2010 1005   K 3.5 01/11/2015 0834   K 4.1 01/19/2012 0942   K 3.7 03/31/2010 1005   CL 103 08/02/2012 1107   CL 96* 01/19/2012 0942   CL 105 03/31/2010 1005   CO2 24 01/11/2015 0834   CO2 28 01/19/2012 0942   CO2 31 03/31/2010 1005   BUN 11.0 01/11/2015 0834   BUN 14 01/19/2012 0942   BUN 12 03/31/2010 1005   CREATININE 0.8 01/11/2015 0834   CREATININE 1.2 01/19/2012 0942   CREATININE 1.02 03/31/2010 1005      Component Value Date/Time   CALCIUM 8.9 01/11/2015 0834   CALCIUM 9.5 01/19/2012 0942   CALCIUM 9.0 03/31/2010 1005   ALKPHOS 107 01/11/2015 0834   ALKPHOS 64 01/19/2012 0942   ALKPHOS 60 03/31/2010 1005   AST 22 01/11/2015 0834   AST 27 01/19/2012 0942   AST 21 03/31/2010 1005   ALT 16 01/11/2015 0834   ALT 23 01/19/2012 0942   ALT 19 03/31/2010 1005   BILITOT 0.24 01/11/2015 0834   BILITOT 0.50 01/19/2012 0942   BILITOT 0.4 03/31/2010 1005     PATHOLOGY: Diagnosis Liver, needle/core biopsy, right inferior hepatic - NON-SMALL CELL CARCINOMA. Microscopic Comment The core biopsies of liver are extensively involved by non-small cell carcinoma consistent with metastatic carcinoma. Immunohistochemistry will be performed and reported as an addendum. (JDP:ecj 10/16/2014) ADDENDUM: Immunohistochemistry is performed and the tumor is strongly positive with MOC-31 and shows patchy positivity with cytokeratin 7 and p16 (HPV). The tumor is negative  with cytokeratin 5/6, cytokeratin 903, p63, thyroid transcription factor-1 and CDX-2. The immunophenotype is non specific. 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 (JDP:gt, 10/19/14) Claudette Laws MD Pathologist, Electronic Signature (Case signed 10/16/2014) Corrected Report Signer Claudette Laws MD Pathologist, Electronic Signature (Case signed 10/19/2014) Specimen Gross and Clinical Information  RADIOGRAPHIC STUDIES: No results found.  ASSESSMENT AND PLAN: This is a very pleasant 74 year old African American male with history of stage IVC supraglottic invasive squamous cell carcinoma diagnosed in May of 2008 in addition to a stage IA non-small cell lung cancer diagnosed in June of 2011 status post resection as well as concurrent chemoradiation for the laryngeal carcinoma and curative radiotherapy to the lung lesion. The patient has been observation since July of 2011 with no evidence for disease recurrence over the last few years. The recent CT scan of the chest showed interval progression of disease especially in the liver  as well as small pulmonary nodules in the left upper lobe and continued increase in the size of the tail of the pancreas worrisome for underlying malignancy. The patient had previous biopsy in the past that showed no evidence of malignancy but this was few years ago. Biopsy performed in May 2016 is consistent with metastatic cancer.  Patient discussed with and also seen by Dr. Julien Nordmann. He is currently being treated with systemic chemotherapy with Carboplatin for an AUC of 5 and paclitaxel 175 MG/M2 given every 3 weeks. Status post 1 cycle. Overall he tolerated his first cycle relatively well the exception of some mild nausea significant increase in fatigue and some achy bone and joint pain. Patient was advised to take Claritin starting  the day before his Neulasta injection and continuing for 4-5 days after the Neulasta injection. He may take Tylenol as needed for pain and is to notify us if he needs something stronger to address his pain. He will proceed with cycle #2 today. He will continue with weekly labs as scheduled. He'll follow-up in 3 weeks prior to the start of cycle #3.   Plan is for 3 cycles and then restaging to see how he responds. We may consider immunotherapy in the future.   He was advised to call immediately if he has any concerning symptoms in the interval.  All questions were answered. The patient knows to call the clinic with any problems, questions or concerns. We can certainly see the patient much sooner if necessary.  Carlton Adam, PA-C 01/11/2015    ADDENDUM: Hematology/Oncology Attending: I had a face to face encounter with the patient. I recommended his care plan. This is a very pleasant 74 years old African-American male with metastatic non-small cell lung cancer, squamous cell carcinoma currently undergoing systemic chemotherapy with carboplatin and paclitaxel is status post 1 cycle. He tolerated the first cycle of his treatment well except for increasing fatigue and aching pain after the Neulasta injection for several days. We advised the patient to take Tylenol as well as Claritin for the aching pain that the Neulasta injection. He will proceed with cycle #2 today as a scheduled. The patient would come back for follow-up visit in 3 weeks for reevaluation before starting cycle #3. He was advised to call immediately if he has any concerning symptoms in the interval.  Disclaimer: This note was dictated with voice recognition software. Similar sounding words can inadvertently be transcribed and may be missed upon review. Eilleen Kempf., MD 01/11/2015

## 2015-01-11 NOTE — Telephone Encounter (Signed)
Patient requesting refill for amLODipine (NORVASC) 5 MG tablet [[id number]] and baclofen (LIORESAL) 10 MG tablet [[id number]]  Pharmacy is [facility]

## 2015-01-11 NOTE — Telephone Encounter (Signed)
Added appt pt will get sched in chemo

## 2015-01-11 NOTE — Telephone Encounter (Signed)
Sent to pharmacy 

## 2015-01-11 NOTE — Patient Instructions (Signed)
Continue weekly labs as scheduled Take Claritin as advised Follow-up in 3 weeks prior to next scheduled cycle of chemotherapy

## 2015-01-11 NOTE — Patient Instructions (Signed)
[name] Discharge Instructions for Patients Receiving Chemotherapy  Today you received the following chemotherapy agents Taxol/Carboplatin.  To help prevent nausea and vomiting after your treatment, we encourage you to take your nausea medication as directed.   If you develop nausea and vomiting that is not controlled by your nausea medication, call the clinic.   BELOW ARE SYMPTOMS THAT SHOULD BE REPORTED IMMEDIATELY:  *FEVER GREATER THAN 100.5 F  *CHILLS WITH OR WITHOUT FEVER  NAUSEA AND VOMITING THAT IS NOT CONTROLLED WITH YOUR NAUSEA MEDICATION  *UNUSUAL SHORTNESS OF BREATH  *UNUSUAL BRUISING OR BLEEDING  TENDERNESS IN MOUTH AND THROAT WITH OR WITHOUT PRESENCE OF ULCERS  *URINARY PROBLEMS  *BOWEL PROBLEMS  UNUSUAL RASH Items with * indicate a potential emergency and should be followed up as soon as possible.  Feel free to call the clinic you have any questions or concerns. The clinic phone number is [phone].  Please show the [facility] at check-in to the Emergency Department and triage nurse.

## 2015-01-12 ENCOUNTER — Ambulatory Visit: Payer: Medicare Other

## 2015-01-13 ENCOUNTER — Ambulatory Visit (HOSPITAL_BASED_OUTPATIENT_CLINIC_OR_DEPARTMENT_OTHER): Payer: Medicare Other

## 2015-01-13 VITALS — BP 98/59 | HR 82 | Temp 98.4°F

## 2015-01-13 DIAGNOSIS — C787 Secondary malignant neoplasm of liver and intrahepatic bile duct: Secondary | ICD-10-CM

## 2015-01-13 DIAGNOSIS — C3412 Malignant neoplasm of upper lobe, left bronchus or lung: Secondary | ICD-10-CM

## 2015-01-13 DIAGNOSIS — C3410 Malignant neoplasm of upper lobe, unspecified bronchus or lung: Secondary | ICD-10-CM

## 2015-01-13 DIAGNOSIS — Z5189 Encounter for other specified aftercare: Secondary | ICD-10-CM

## 2015-01-13 MED ORDER — PEGFILGRASTIM INJECTION 6 MG/0.6ML ~~LOC~~
6.0000 mg | PREFILLED_SYRINGE | Freq: Once | SUBCUTANEOUS | Status: AC
  Administered 2015-01-13: 6 mg via SUBCUTANEOUS
  Filled 2015-01-13: qty 0.6

## 2015-01-18 ENCOUNTER — Other Ambulatory Visit (HOSPITAL_BASED_OUTPATIENT_CLINIC_OR_DEPARTMENT_OTHER): Payer: Medicare Other

## 2015-01-18 DIAGNOSIS — C787 Secondary malignant neoplasm of liver and intrahepatic bile duct: Secondary | ICD-10-CM | POA: Diagnosis not present

## 2015-01-18 DIAGNOSIS — C3410 Malignant neoplasm of upper lobe, unspecified bronchus or lung: Secondary | ICD-10-CM

## 2015-01-18 DIAGNOSIS — C3412 Malignant neoplasm of upper lobe, left bronchus or lung: Secondary | ICD-10-CM

## 2015-01-18 LAB — CBC WITH DIFFERENTIAL/PLATELET
BASO%: 0.8 % (ref 0.0–2.0)
BASOS ABS: 0 10*3/uL (ref 0.0–0.1)
EOS%: 0.7 % (ref 0.0–7.0)
Eosinophils Absolute: 0 10*3/uL (ref 0.0–0.5)
HCT: 40.7 % (ref 38.4–49.9)
HEMOGLOBIN: 13.3 g/dL (ref 13.0–17.1)
LYMPH%: 13.6 % — AB (ref 14.0–49.0)
MCH: 25.3 pg — ABNORMAL LOW (ref 27.2–33.4)
MCHC: 32.6 g/dL (ref 32.0–36.0)
MCV: 77.6 fL — AB (ref 79.3–98.0)
MONO#: 0.6 10*3/uL (ref 0.1–0.9)
MONO%: 15.5 % — ABNORMAL HIGH (ref 0.0–14.0)
NEUT#: 2.7 10*3/uL (ref 1.5–6.5)
NEUT%: 69.4 % (ref 39.0–75.0)
PLATELETS: 139 10*3/uL — AB (ref 140–400)
RBC: 5.25 10*6/uL (ref 4.20–5.82)
RDW: 19.6 % — ABNORMAL HIGH (ref 11.0–14.6)
WBC: 4 10*3/uL (ref 4.0–10.3)
lymph#: 0.5 10*3/uL — ABNORMAL LOW (ref 0.9–3.3)

## 2015-01-18 LAB — COMPREHENSIVE METABOLIC PANEL (CC13)
ALT: 18 U/L (ref 0–55)
AST: 23 U/L (ref 5–34)
Albumin: 3.7 g/dL (ref 3.5–5.0)
Alkaline Phosphatase: 110 U/L (ref 40–150)
Anion Gap: 10 mEq/L (ref 3–11)
BUN: 16.3 mg/dL (ref 7.0–26.0)
CO2: 25 mEq/L (ref 22–29)
CREATININE: 0.9 mg/dL (ref 0.7–1.3)
Calcium: 9.1 mg/dL (ref 8.4–10.4)
Chloride: 107 mEq/L (ref 98–109)
EGFR: >90 mL/min/{1.73_m2} (ref >90)
GLUCOSE: 105 mg/dL (ref 70–140)
Potassium: 3.6 mEq/L (ref 3.5–5.1)
SODIUM: 142 meq/L (ref 136–145)
Total Bilirubin: 0.53 mg/dL (ref 0.20–1.20)
Total Protein: 7 g/dL (ref 6.4–8.3)

## 2015-01-25 ENCOUNTER — Other Ambulatory Visit (HOSPITAL_BASED_OUTPATIENT_CLINIC_OR_DEPARTMENT_OTHER): Payer: Medicare Other

## 2015-01-25 DIAGNOSIS — C787 Secondary malignant neoplasm of liver and intrahepatic bile duct: Secondary | ICD-10-CM | POA: Diagnosis not present

## 2015-01-25 DIAGNOSIS — C3412 Malignant neoplasm of upper lobe, left bronchus or lung: Secondary | ICD-10-CM

## 2015-01-25 DIAGNOSIS — C3410 Malignant neoplasm of upper lobe, unspecified bronchus or lung: Secondary | ICD-10-CM

## 2015-01-25 LAB — COMPREHENSIVE METABOLIC PANEL (CC13)
ALBUMIN: 3.8 g/dL (ref 3.5–5.0)
ALK PHOS: 155 U/L — AB (ref 40–150)
ALT: 18 U/L (ref 0–55)
AST: 23 U/L (ref 5–34)
Anion Gap: 10 mEq/L (ref 3–11)
BILIRUBIN TOTAL: 0.34 mg/dL (ref 0.20–1.20)
BUN: 10.6 mg/dL (ref 7.0–26.0)
CALCIUM: 9.5 mg/dL (ref 8.4–10.4)
CO2: 24 mEq/L (ref 22–29)
Chloride: 108 mEq/L (ref 98–109)
Creatinine: 1 mg/dL (ref 0.7–1.3)
EGFR: 87 mL/min/{1.73_m2} — AB (ref >90)
GLUCOSE: 111 mg/dL (ref 70–140)
Potassium: 4.1 mEq/L (ref 3.5–5.1)
SODIUM: 142 meq/L (ref 136–145)
TOTAL PROTEIN: 7 g/dL (ref 6.4–8.3)

## 2015-01-25 LAB — CBC WITH DIFFERENTIAL/PLATELET
BASO%: 0.5 % (ref 0.0–2.0)
BASOS ABS: 0.1 10*3/uL (ref 0.0–0.1)
EOS ABS: 0 10*3/uL (ref 0.0–0.5)
EOS%: 0.1 % (ref 0.0–7.0)
HEMATOCRIT: 42.3 % (ref 38.4–49.9)
HEMOGLOBIN: 13.7 g/dL (ref 13.0–17.1)
LYMPH#: 1 10*3/uL (ref 0.9–3.3)
LYMPH%: 6 % — ABNORMAL LOW (ref 14.0–49.0)
MCH: 25.6 pg — ABNORMAL LOW (ref 27.2–33.4)
MCHC: 32.5 g/dL (ref 32.0–36.0)
MCV: 78.8 fL — AB (ref 79.3–98.0)
MONO#: 0.6 10*3/uL (ref 0.1–0.9)
MONO%: 3.7 % (ref 0.0–14.0)
NEUT%: 89.7 % — ABNORMAL HIGH (ref 39.0–75.0)
NEUTROS ABS: 14.2 10*3/uL — AB (ref 1.5–6.5)
Platelets: 229 10*3/uL (ref 140–400)
RBC: 5.37 10*6/uL (ref 4.20–5.82)
RDW: 20.5 % — AB (ref 11.0–14.6)
WBC: 15.9 10*3/uL — AB (ref 4.0–10.3)

## 2015-01-28 ENCOUNTER — Other Ambulatory Visit: Payer: Self-pay | Admitting: Medical Oncology

## 2015-01-28 DIAGNOSIS — C3490 Malignant neoplasm of unspecified part of unspecified bronchus or lung: Secondary | ICD-10-CM

## 2015-02-01 ENCOUNTER — Ambulatory Visit (HOSPITAL_BASED_OUTPATIENT_CLINIC_OR_DEPARTMENT_OTHER): Payer: Medicare Other

## 2015-02-01 ENCOUNTER — Other Ambulatory Visit: Payer: Self-pay | Admitting: Medical Oncology

## 2015-02-01 ENCOUNTER — Encounter: Payer: Self-pay | Admitting: Physician Assistant

## 2015-02-01 ENCOUNTER — Other Ambulatory Visit (HOSPITAL_BASED_OUTPATIENT_CLINIC_OR_DEPARTMENT_OTHER): Payer: Medicare Other

## 2015-02-01 ENCOUNTER — Ambulatory Visit (HOSPITAL_BASED_OUTPATIENT_CLINIC_OR_DEPARTMENT_OTHER): Payer: Medicare Other | Admitting: Physician Assistant

## 2015-02-01 VITALS — BP 144/93 | HR 94 | Temp 98.2°F | Resp 18 | Ht 66.0 in | Wt 166.9 lb

## 2015-02-01 DIAGNOSIS — C787 Secondary malignant neoplasm of liver and intrahepatic bile duct: Secondary | ICD-10-CM

## 2015-02-01 DIAGNOSIS — Z5111 Encounter for antineoplastic chemotherapy: Secondary | ICD-10-CM | POA: Diagnosis not present

## 2015-02-01 DIAGNOSIS — C3412 Malignant neoplasm of upper lobe, left bronchus or lung: Secondary | ICD-10-CM

## 2015-02-01 DIAGNOSIS — R300 Dysuria: Secondary | ICD-10-CM

## 2015-02-01 DIAGNOSIS — Z8521 Personal history of malignant neoplasm of larynx: Secondary | ICD-10-CM | POA: Diagnosis not present

## 2015-02-01 DIAGNOSIS — C3490 Malignant neoplasm of unspecified part of unspecified bronchus or lung: Secondary | ICD-10-CM

## 2015-02-01 DIAGNOSIS — C3410 Malignant neoplasm of upper lobe, unspecified bronchus or lung: Secondary | ICD-10-CM

## 2015-02-01 LAB — COMPREHENSIVE METABOLIC PANEL (CC13)
ALT: 23 U/L (ref 0–55)
AST: 28 U/L (ref 5–34)
Albumin: 3.3 g/dL — ABNORMAL LOW (ref 3.5–5.0)
Alkaline Phosphatase: 132 U/L (ref 40–150)
Anion Gap: 10 mEq/L (ref 3–11)
BUN: 9.6 mg/dL (ref 7.0–26.0)
CHLORIDE: 108 meq/L (ref 98–109)
CO2: 22 meq/L (ref 22–29)
CREATININE: 0.8 mg/dL (ref 0.7–1.3)
Calcium: 8.9 mg/dL (ref 8.4–10.4)
EGFR: >90 mL/min/{1.73_m2} (ref >90)
GLUCOSE: 108 mg/dL (ref 70–140)
Potassium: 3.7 mEq/L (ref 3.5–5.1)
Sodium: 140 mEq/L (ref 136–145)
Total Bilirubin: 0.25 mg/dL (ref 0.20–1.20)
Total Protein: 6.5 g/dL (ref 6.4–8.3)

## 2015-02-01 LAB — URINALYSIS, MICROSCOPIC - CHCC
Bilirubin (Urine): NEGATIVE
Blood: NEGATIVE
GLUCOSE UR CHCC: NEGATIVE mg/dL
Ketones: NEGATIVE mg/dL
Nitrite: NEGATIVE
PH: 6 (ref 4.6–8.0)
PROTEIN: NEGATIVE mg/dL
RBC / HPF: NEGATIVE (ref 0–2)
Specific Gravity, Urine: 1.005 (ref 1.003–1.035)
UROBILINOGEN UR: 0.2 mg/dL (ref 0.2–1)

## 2015-02-01 LAB — CBC WITH DIFFERENTIAL/PLATELET
BASO%: 0.6 % (ref 0.0–2.0)
Basophils Absolute: 0.1 10*3/uL (ref 0.0–0.1)
EOS%: 0.1 % (ref 0.0–7.0)
Eosinophils Absolute: 0 10*3/uL (ref 0.0–0.5)
HCT: 38.8 % (ref 38.4–49.9)
HGB: 12.9 g/dL — ABNORMAL LOW (ref 13.0–17.1)
LYMPH#: 1 10*3/uL (ref 0.9–3.3)
LYMPH%: 8.5 % — AB (ref 14.0–49.0)
MCH: 26.3 pg — ABNORMAL LOW (ref 27.2–33.4)
MCHC: 33.2 g/dL (ref 32.0–36.0)
MCV: 79.4 fL (ref 79.3–98.0)
MONO#: 0.6 10*3/uL (ref 0.1–0.9)
MONO%: 5.3 % (ref 0.0–14.0)
NEUT#: 9.8 10*3/uL — ABNORMAL HIGH (ref 1.5–6.5)
NEUT%: 85.5 % — AB (ref 39.0–75.0)
Platelets: 251 10*3/uL (ref 140–400)
RBC: 4.89 10*6/uL (ref 4.20–5.82)
RDW: 21.8 % — ABNORMAL HIGH (ref 11.0–14.6)
WBC: 11.4 10*3/uL — AB (ref 4.0–10.3)

## 2015-02-01 MED ORDER — SODIUM CHLORIDE 0.9 % IV SOLN
Freq: Once | INTRAVENOUS | Status: AC
  Administered 2015-02-01: 11:00:00 via INTRAVENOUS

## 2015-02-01 MED ORDER — SODIUM CHLORIDE 0.9 % IV SOLN
493.0000 mg | Freq: Once | INTRAVENOUS | Status: AC
  Administered 2015-02-01: 490 mg via INTRAVENOUS
  Filled 2015-02-01: qty 49

## 2015-02-01 MED ORDER — FAMOTIDINE IN NACL 20-0.9 MG/50ML-% IV SOLN
20.0000 mg | Freq: Once | INTRAVENOUS | Status: AC
  Administered 2015-02-01: 20 mg via INTRAVENOUS

## 2015-02-01 MED ORDER — DIPHENHYDRAMINE HCL 50 MG/ML IJ SOLN
50.0000 mg | Freq: Once | INTRAMUSCULAR | Status: AC
  Administered 2015-02-01: 50 mg via INTRAVENOUS

## 2015-02-01 MED ORDER — SODIUM CHLORIDE 0.9 % IV SOLN
Freq: Once | INTRAVENOUS | Status: AC
  Administered 2015-02-01: 11:00:00 via INTRAVENOUS
  Filled 2015-02-01: qty 8

## 2015-02-01 MED ORDER — PACLITAXEL CHEMO INJECTION 300 MG/50ML
175.0000 mg/m2 | Freq: Once | INTRAVENOUS | Status: AC
  Administered 2015-02-01: 336 mg via INTRAVENOUS
  Filled 2015-02-01: qty 56

## 2015-02-01 MED ORDER — DIPHENHYDRAMINE HCL 50 MG/ML IJ SOLN
INTRAMUSCULAR | Status: AC
  Filled 2015-02-01: qty 1

## 2015-02-01 MED ORDER — FAMOTIDINE IN NACL 20-0.9 MG/50ML-% IV SOLN
INTRAVENOUS | Status: AC
  Filled 2015-02-01: qty 50

## 2015-02-01 NOTE — Patient Instructions (Signed)
Continue weekly labs as scheduled Follow up in 3 weeks, prior to your next scheduled cycle of chemotherapy with a restaging CT scan of your chest, abdomen and pelvis to re-evaluate your disease.

## 2015-02-01 NOTE — Progress Notes (Addendum)
[name] [phone]   [phone]  OFFICE PROGRESS NOTE  Olga Millers, MD [place] [address]  PRINCIPAL DIAGNOSES:  1. 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 2. Stage IA non-small-cell lung cancer diagnosed in June 2011.  PRIOR THERAPY:  1. Status post total laryngectomy with. Bilateral selective lymph node dissection as well as left superior parotidectomy with nerve dissection under the care of Dr. Redmond Baseman with positive resection margin. 2. Status post course of concurrent chemoradiation with weekly cisplatin. Last dose of chemotherapy was given January 08, 2007. 3. Status post curative radiotherapy with tomotherapy to the lung lesions completed December 31, 2009 under the care of Dr. Pablo Ledger.  CURRENT THERAPY: Systemic chemotherapy with carboplatin for an AUC of 5 and paclitaxel 175 mg meter squared. Status post 2 cycles  INTERVAL HISTORY: [name] 74 y.o. male returns to the clinic today with his wife prior to starting cycle #3 of carboplatin and paclitaxel. He reports he continues to have about 3 days where he had significantly increased fatigue, malaise as well as some achy bones and joints after the Neulasta injection. He had some mild nausea that was well-controlled with his current antiemetics. He otherwise is tolerating his chemotherapy with carboplatin and paclitaxel with Neulasta support relatively well. Today he voices no specific complaints. The patient is feeling fine today with no specific complaints. He denied having any significant chest pain, shortness of breath, cough or hemoptysis. He has no weight loss or night sweats.   MEDICAL HISTORY: Past Medical History  Diagnosis Date  . Hypertension   . 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 Cancer 2009    SUPERGLOTTIS INVASIVE SQ CELL CA    ALLERGIES:  has No Known Allergies.  MEDICATIONS:  Current  Outpatient Prescriptions  Medication Sig Dispense Refill  . albuterol (PROVENTIL) (2.5 MG/3ML) 0.083% nebulizer solution Take 2.5 mg by nebulization every 6 (six) hours as needed for wheezing or shortness of breath.    Marland Kitchen amLODipine (NORVASC) 5 MG tablet Take 1 tablet (5 mg total) by mouth at bedtime. 30 tablet 3  . amLODipine (NORVASC) 5 MG tablet Take 1 tablet (5 mg total) by mouth at bedtime. 90 tablet 3  . aspirin 81 MG tablet Take 81 mg by mouth every morning.     . baclofen (LIORESAL) 10 MG tablet Take 1 tablet (10 mg total) by mouth at bedtime. 90 each 3  . cholecalciferol (VITAMIN D) 1000 UNITS tablet Take 1,000 Units by mouth every morning.     . finasteride (PROSCAR) 5 MG tablet Take 1 tablet (5 mg total) by mouth every morning. 90 tablet 3  . metoprolol succinate (TOPROL-XL) 25 MG 24 hr tablet Take 1 tablet (25 mg total) by mouth every morning. 90 tablet 3  . prochlorperazine (COMPAZINE) 10 MG tablet Take 1 tablet (10 mg total) by mouth every 6 (six) hours as needed for nausea or vomiting. 30 tablet 0  . simvastatin (ZOCOR) 20 MG tablet Take 1 tablet (20 mg total) by mouth every evening. 30 tablet 1  . tiZANidine (ZANAFLEX) 4 MG tablet Take 1 tablet (4 mg total) by mouth 3 (three) times daily as needed for muscle spasms. 90 tablet 1   No current facility-administered medications for this visit.    REVIEW OF SYSTEMS:  A comprehensive review of systems was negative except for: Constitutional: positive for fatigue Gastrointestinal:  positive for nausea Musculoskeletal: positive for arthralgias and bone pain   PHYSICAL EXAMINATION: General appearance: alert, cooperative and no distress Head: Normocephalic, without obvious abnormality, atraumatic Neck: no adenopathy Lymph nodes: Cervical, supraclavicular, and axillary nodes normal. Resp: clear to auscultation bilaterally Cardio: regular rate and rhythm, S1, S2 normal, no murmur, click, rub or gallop GI: soft, non-tender; bowel sounds  normal; no masses,  no organomegaly Extremities: extremities normal, atraumatic, no cyanosis or edema  ECOG PERFORMANCE STATUS: 1 - Symptomatic but completely ambulatory  Blood pressure 144/93, pulse 94, temperature 98.2 F (36.8 C), temperature source Oral, resp. rate 18, height '5\' 6"'$  (1.676 m), weight 166 lb 14.4 oz (75.705 kg), SpO2 97 %.  LABORATORY DATA: Lab Results  Component Value Date   WBC 11.4* 02/01/2015   HGB 12.9* 02/01/2015   HCT 38.8 02/01/2015   MCV 79.4 02/01/2015   PLT 251 02/01/2015      Chemistry      Component Value Date/Time   NA 140 02/01/2015 0926   NA 139 01/19/2012 0942   NA 141 03/31/2010 1005   K 3.7 02/01/2015 0926   K 4.1 01/19/2012 0942   K 3.7 03/31/2010 1005   CL 103 08/02/2012 1107   CL 96* 01/19/2012 0942   CL 105 03/31/2010 1005   CO2 22 02/01/2015 0926   CO2 28 01/19/2012 0942   CO2 31 03/31/2010 1005   BUN 9.6 02/01/2015 0926   BUN 14 01/19/2012 0942   BUN 12 03/31/2010 1005   CREATININE 0.8 02/01/2015 0926   CREATININE 1.2 01/19/2012 0942   CREATININE 1.02 03/31/2010 1005      Component Value Date/Time   CALCIUM 8.9 02/01/2015 0926   CALCIUM 9.5 01/19/2012 0942   CALCIUM 9.0 03/31/2010 1005   ALKPHOS 132 02/01/2015 0926   ALKPHOS 64 01/19/2012 0942   ALKPHOS 60 03/31/2010 1005   AST 28 02/01/2015 0926   AST 27 01/19/2012 0942   AST 21 03/31/2010 1005   ALT 23 02/01/2015 0926   ALT 23 01/19/2012 0942   ALT 19 03/31/2010 1005   BILITOT 0.25 02/01/2015 0926   BILITOT 0.50 01/19/2012 0942   BILITOT 0.4 03/31/2010 1005     PATHOLOGY: Diagnosis Liver, needle/core biopsy, right inferior hepatic - NON-SMALL CELL CARCINOMA. Microscopic Comment The core biopsies of liver are extensively involved by non-small cell carcinoma consistent with metastatic carcinoma. Immunohistochemistry will be performed and reported as an addendum. (JDP:ecj 10/16/2014) ADDENDUM: Immunohistochemistry is performed and the tumor is strongly positive  with MOC-31 and shows patchy positivity with cytokeratin 7 and p16 (HPV). The tumor is negative with cytokeratin 5/6, cytokeratin 903, p63, thyroid transcription factor-1 and CDX-2. The immunophenotype is non specific. 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 (JDP:gt, 10/19/14) Claudette Laws MD Pathologist, Electronic Signature (Case signed 10/16/2014) Corrected Report Signer Claudette Laws MD Pathologist, Electronic Signature (Case signed 10/19/2014) Specimen Gross and Clinical Information  RADIOGRAPHIC STUDIES: No results found.  ASSESSMENT AND PLAN: This is a very pleasant 74 year old African American male with history of stage IVC supraglottic invasive squamous cell carcinoma diagnosed in May of 2008 in addition to a stage IA non-small cell lung cancer diagnosed in June of 2011 status post resection as well as concurrent chemoradiation for the laryngeal carcinoma and curative radiotherapy to the lung lesion. The patient has been observation since July of 2011 with no evidence for disease recurrence  over the last few years. The recent CT scan of the chest showed interval progression of disease especially in the liver as well as small pulmonary nodules in the left upper lobe and continued increase in the size of the tail of the pancreas worrisome for underlying malignancy. The patient had previous biopsy in the past that showed no evidence of malignancy but this was few years ago. Biopsy performed in May 2016 is consistent with metastatic cancer.  Patient discussed with and also seen by Dr. Julien Nordmann. He is currently being treated with systemic chemotherapy with Carboplatin for an AUC of 5 and paclitaxel 175 MG/M2 given every 3 weeks. Status post 2 cyclea. Overall he tolerated his first cycle relatively well the exception of some mild nausea significant  increase in fatigue and some achy bone and joint pain. Patient was advised to take Claritin starting the day before his Neulasta injection and continuing for 4-5 days after the Neulasta injection. He may take Tylenol as needed for pain and is to notify us if he needs something stronger to address his pain. He will proceed with cycle #3 today. He will continue with weekly labs as scheduled. He'll follow-up in 3 weeks prior to the start of cycle #4 with a restaging CT scan of the chest, abdomen and pelvis with contrast to re-evaluate his disease..   We may consider immunotherapy in the future if his restaging CT scan reveals disease progression.Marland Kitchen   He was advised to call immediately if he has any concerning symptoms in the interval.  All questions were answered. The patient knows to call the clinic with any problems, questions or concerns. We can certainly see the patient much sooner if necessary.  Carlton Adam, PA-C 02/01/2015  ADDENDUM: Hematology/Oncology Attending: I had a face to face encounter with the patient. I recommended his care plan. This is a very pleasant 74 years old African-American male with metastatic non-small cell lung cancer, squamous cell carcinoma who is currently undergoing systemic chemotherapy with carboplatin and paclitaxel is status post 2 cycles. He is rating his treatment well except for the aching pain and fatigue after the Neulasta injection. The patient is feeling much better today and he is ready to start cycle #3 of his treatment. We'll proceed with treatment as scheduled. He would come back for follow-up visit in 3 weeks for reevaluation after repeating CT scan of the chest, abdomen and pelvis for restaging of his disease. The patient was advised to call immediately if he has any concerning symptoms in the interval.  Disclaimer: This note was dictated with voice recognition software. Similar sounding words can inadvertently be transcribed and may be missed  upon review.  Eilleen Kempf., MD 02/01/2015

## 2015-02-01 NOTE — Patient Instructions (Signed)
Paclitaxel injection What is this medicine? PACLITAXEL (PAK li TAX el) is a chemotherapy drug. It targets fast dividing cells, like cancer cells, and causes these cells to die. This medicine is used to treat ovarian cancer, breast cancer, and other cancers. This medicine may be used for other purposes; ask your health care provider or pharmacist if you have questions. COMMON BRAND NAME(S): Onxol, Taxol What should I tell my health care provider before I take this medicine? They need to know if you have any of these conditions: -blood disorders -irregular heartbeat -infection (especially a virus infection such as chickenpox, cold sores, or herpes) -liver disease -previous or ongoing radiation therapy -an unusual or allergic reaction to paclitaxel, alcohol, polyoxyethylated castor oil, other chemotherapy agents, other medicines, foods, dyes, or preservatives -pregnant or trying to get pregnant -breast-feeding How should I use this medicine? This drug is given as an infusion into a vein. It is administered in a hospital or clinic by a specially trained health care professional. Talk to your pediatrician regarding the use of this medicine in children. Special care may be needed. Overdosage: If you think you have taken too much of this medicine contact a poison control center or emergency room at once. NOTE: This medicine is only for you. Do not share this medicine with others. What if I miss a dose? It is important not to miss your dose. Call your doctor or health care professional if you are unable to keep an appointment. What may interact with this medicine? Do not take this medicine with any of the following medications: -disulfiram -metronidazole This medicine may also interact with the following medications: -cyclosporine -diazepam -ketoconazole -medicines to increase blood counts like filgrastim, pegfilgrastim, sargramostim -other chemotherapy drugs like cisplatin, doxorubicin,  epirubicin, etoposide, teniposide, vincristine -quinidine -testosterone -vaccines -verapamil Talk to your doctor or health care professional before taking any of these medicines: -acetaminophen -aspirin -ibuprofen -ketoprofen -naproxen This list may not describe all possible interactions. Give your health care provider a list of all the medicines, herbs, non-prescription drugs, or dietary supplements you use. Also tell them if you smoke, drink alcohol, or use illegal drugs. Some items may interact with your medicine. What should I watch for while using this medicine? Your condition will be monitored carefully while you are receiving this medicine. You will need important blood work done while you are taking this medicine. This drug may make you feel generally unwell. This is not uncommon, as chemotherapy can affect healthy cells as well as cancer cells. Report any side effects. Continue your course of treatment even though you feel ill unless your doctor tells you to stop. In some cases, you may be given additional medicines to help with side effects. Follow all directions for their use. Call your doctor or health care professional for advice if you get a fever, chills or sore throat, or other symptoms of a cold or flu. Do not treat yourself. This drug decreases your body's ability to fight infections. Try to avoid being around people who are sick. This medicine may increase your risk to bruise or bleed. Call your doctor or health care professional if you notice any unusual bleeding. Be careful brushing and flossing your teeth or using a toothpick because you may get an infection or bleed more easily. If you have any dental work done, tell your dentist you are receiving this medicine. Avoid taking products that contain aspirin, acetaminophen, ibuprofen, naproxen, or ketoprofen unless instructed by your doctor. These medicines may hide a fever.   Do not become pregnant while taking this medicine.  Women should inform their doctor if they wish to become pregnant or think they might be pregnant. There is a potential for serious side effects to an unborn child. Talk to your health care professional or pharmacist for more information. Do not breast-feed an infant while taking this medicine. Men are advised not to father a child while receiving this medicine. What side effects may I notice from receiving this medicine? Side effects that you should report to your doctor or health care professional as soon as possible: -allergic reactions like skin rash, itching or hives, swelling of the face, lips, or tongue -low blood counts - This drug may decrease the number of white blood cells, red blood cells and platelets. You may be at increased risk for infections and bleeding. -signs of infection - fever or chills, cough, sore throat, pain or difficulty passing urine -signs of decreased platelets or bleeding - bruising, pinpoint red spots on the skin, black, tarry stools, nosebleeds -signs of decreased red blood cells - unusually weak or tired, fainting spells, lightheadedness -breathing problems -chest pain -high or low blood pressure -mouth sores -nausea and vomiting -pain, swelling, redness or irritation at the injection site -pain, tingling, numbness in the hands or feet -slow or irregular heartbeat -swelling of the ankle, feet, hands Side effects that usually do not require medical attention (report to your doctor or health care professional if they continue or are bothersome): -bone pain -complete hair loss including hair on your head, underarms, pubic hair, eyebrows, and eyelashes -changes in the color of fingernails -diarrhea -loosening of the fingernails -loss of appetite -muscle or joint pain -red flush to skin -sweating This list may not describe all possible side effects. Call your doctor for medical advice about side effects. You may report side effects to FDA at  1-800-FDA-1088. Where should I keep my medicine? This drug is given in a hospital or clinic and will not be stored at home. NOTE: This sheet is a summary. It may not cover all possible information. If you have questions about this medicine, talk to your doctor, pharmacist, or health care provider.  2015, [employer] Standard. (2012-07-22 16:41:21)  Carboplatin injection What is this medicine? CARBOPLATIN (KAR boe pla tin) is a chemotherapy drug. It targets fast dividing cells, like cancer cells, and causes these cells to die. This medicine is used to treat ovarian cancer and many other cancers. This medicine may be used for other purposes; ask your health care provider or pharmacist if you have questions. COMMON BRAND NAME(S): Paraplatin What should I tell my health care provider before I take this medicine? They need to know if you have any of these conditions: -blood disorders -hearing problems -kidney disease -recent or ongoing radiation therapy -an unusual or allergic reaction to carboplatin, cisplatin, other chemotherapy, other medicines, foods, dyes, or preservatives -pregnant or trying to get pregnant -breast-feeding How should I use this medicine? This drug is usually given as an infusion into a vein. It is administered in a hospital or clinic by a specially trained health care professional. Talk to your pediatrician regarding the use of this medicine in children. Special care may be needed. Overdosage: If you think you have taken too much of this medicine contact a poison control center or emergency room at once. NOTE: This medicine is only for you. Do not share this medicine with others. What if I miss a dose? It is important not to miss a dose. Call your   doctor or health care professional if you are unable to keep an appointment. What may interact with this medicine? -medicines for seizures -medicines to increase blood counts like filgrastim, pegfilgrastim,  sargramostim -some antibiotics like amikacin, gentamicin, neomycin, streptomycin, tobramycin -vaccines Talk to your doctor or health care professional before taking any of these medicines: -acetaminophen -aspirin -ibuprofen -ketoprofen -naproxen This list may not describe all possible interactions. Give your health care provider a list of all the medicines, herbs, non-prescription drugs, or dietary supplements you use. Also tell them if you smoke, drink alcohol, or use illegal drugs. Some items may interact with your medicine. What should I watch for while using this medicine? Your condition will be monitored carefully while you are receiving this medicine. You will need important blood work done while you are taking this medicine. This drug may make you feel generally unwell. This is not uncommon, as chemotherapy can affect healthy cells as well as cancer cells. Report any side effects. Continue your course of treatment even though you feel ill unless your doctor tells you to stop. In some cases, you may be given additional medicines to help with side effects. Follow all directions for their use. Call your doctor or health care professional for advice if you get a fever, chills or sore throat, or other symptoms of a cold or flu. Do not treat yourself. This drug decreases your body's ability to fight infections. Try to avoid being around people who are sick. This medicine may increase your risk to bruise or bleed. Call your doctor or health care professional if you notice any unusual bleeding. Be careful brushing and flossing your teeth or using a toothpick because you may get an infection or bleed more easily. If you have any dental work done, tell your dentist you are receiving this medicine. Avoid taking products that contain aspirin, acetaminophen, ibuprofen, naproxen, or ketoprofen unless instructed by your doctor. These medicines may hide a fever. Do not become pregnant while taking this  medicine. Women should inform their doctor if they wish to become pregnant or think they might be pregnant. There is a potential for serious side effects to an unborn child. Talk to your health care professional or pharmacist for more information. Do not breast-feed an infant while taking this medicine. What side effects may I notice from receiving this medicine? Side effects that you should report to your doctor or health care professional as soon as possible: -allergic reactions like skin rash, itching or hives, swelling of the face, lips, or tongue -signs of infection - fever or chills, cough, sore throat, pain or difficulty passing urine -signs of decreased platelets or bleeding - bruising, pinpoint red spots on the skin, black, tarry stools, nosebleeds -signs of decreased red blood cells - unusually weak or tired, fainting spells, lightheadedness -breathing problems -changes in hearing -changes in vision -chest pain -high blood pressure -low blood counts - This drug may decrease the number of white blood cells, red blood cells and platelets. You may be at increased risk for infections and bleeding. -nausea and vomiting -pain, swelling, redness or irritation at the injection site -pain, tingling, numbness in the hands or feet -problems with balance, talking, walking -trouble passing urine or change in the amount of urine Side effects that usually do not require medical attention (report to your doctor or health care professional if they continue or are bothersome): -hair loss -loss of appetite -metallic taste in the mouth or changes in taste This list may not describe all   possible side effects. Call your doctor for medical advice about side effects. You may report side effects to FDA at 1-800-FDA-1088. Where should I keep my medicine? This drug is given in a hospital or clinic and will not be stored at home. NOTE: This sheet is a summary. It may not cover all possible information. If you  have questions about this medicine, talk to your doctor, pharmacist, or health care provider.  2015, [employer] Standard. (2007-09-03 14:38:05)  

## 2015-02-01 NOTE — Progress Notes (Unsigned)
Pt reports burning with urination today and last week. He is having urinary frequency this am.

## 2015-02-02 ENCOUNTER — Ambulatory Visit: Payer: Medicare Other

## 2015-02-03 ENCOUNTER — Ambulatory Visit (HOSPITAL_BASED_OUTPATIENT_CLINIC_OR_DEPARTMENT_OTHER): Payer: Medicare Other

## 2015-02-03 VITALS — BP 91/68 | HR 102 | Temp 98.7°F

## 2015-02-03 DIAGNOSIS — Z5189 Encounter for other specified aftercare: Secondary | ICD-10-CM | POA: Diagnosis not present

## 2015-02-03 DIAGNOSIS — C787 Secondary malignant neoplasm of liver and intrahepatic bile duct: Secondary | ICD-10-CM | POA: Diagnosis not present

## 2015-02-03 DIAGNOSIS — C3410 Malignant neoplasm of upper lobe, unspecified bronchus or lung: Secondary | ICD-10-CM

## 2015-02-03 DIAGNOSIS — C3412 Malignant neoplasm of upper lobe, left bronchus or lung: Secondary | ICD-10-CM | POA: Diagnosis not present

## 2015-02-03 MED ORDER — PEGFILGRASTIM INJECTION 6 MG/0.6ML ~~LOC~~
6.0000 mg | PREFILLED_SYRINGE | Freq: Once | SUBCUTANEOUS | Status: AC
  Administered 2015-02-03: 6 mg via SUBCUTANEOUS
  Filled 2015-02-03: qty 0.6

## 2015-02-05 ENCOUNTER — Other Ambulatory Visit: Payer: Self-pay | Admitting: *Deleted

## 2015-02-05 DIAGNOSIS — C329 Malignant neoplasm of larynx, unspecified: Secondary | ICD-10-CM

## 2015-02-05 DIAGNOSIS — C3490 Malignant neoplasm of unspecified part of unspecified bronchus or lung: Secondary | ICD-10-CM

## 2015-02-08 ENCOUNTER — Other Ambulatory Visit (HOSPITAL_BASED_OUTPATIENT_CLINIC_OR_DEPARTMENT_OTHER): Payer: Medicare Other

## 2015-02-08 DIAGNOSIS — C787 Secondary malignant neoplasm of liver and intrahepatic bile duct: Secondary | ICD-10-CM | POA: Diagnosis not present

## 2015-02-08 DIAGNOSIS — C3412 Malignant neoplasm of upper lobe, left bronchus or lung: Secondary | ICD-10-CM | POA: Diagnosis not present

## 2015-02-08 DIAGNOSIS — C3490 Malignant neoplasm of unspecified part of unspecified bronchus or lung: Secondary | ICD-10-CM

## 2015-02-08 DIAGNOSIS — C329 Malignant neoplasm of larynx, unspecified: Secondary | ICD-10-CM

## 2015-02-08 LAB — COMPREHENSIVE METABOLIC PANEL (CC13)
ALT: 19 U/L (ref 0–55)
AST: 23 U/L (ref 5–34)
Albumin: 3.5 g/dL (ref 3.5–5.0)
Alkaline Phosphatase: 118 U/L (ref 40–150)
Anion Gap: 10 mEq/L (ref 3–11)
BUN: 13.1 mg/dL (ref 7.0–26.0)
CHLORIDE: 104 meq/L (ref 98–109)
CO2: 27 meq/L (ref 22–29)
CREATININE: 0.9 mg/dL (ref 0.7–1.3)
Calcium: 9.2 mg/dL (ref 8.4–10.4)
EGFR: >90 mL/min/{1.73_m2} (ref >90)
Glucose: 111 mg/dl (ref 70–140)
POTASSIUM: 3.7 meq/L (ref 3.5–5.1)
Sodium: 140 mEq/L (ref 136–145)
Total Bilirubin: 0.52 mg/dL (ref 0.20–1.20)
Total Protein: 7 g/dL (ref 6.4–8.3)

## 2015-02-08 LAB — CBC WITH DIFFERENTIAL/PLATELET
BASO%: 1 % (ref 0.0–2.0)
BASOS ABS: 0 10*3/uL (ref 0.0–0.1)
EOS%: 0.5 % (ref 0.0–7.0)
Eosinophils Absolute: 0 10*3/uL (ref 0.0–0.5)
HCT: 38.1 % — ABNORMAL LOW (ref 38.4–49.9)
HGB: 12.6 g/dL — ABNORMAL LOW (ref 13.0–17.1)
LYMPH%: 19.8 % (ref 14.0–49.0)
MCH: 26.5 pg — AB (ref 27.2–33.4)
MCHC: 33.1 g/dL (ref 32.0–36.0)
MCV: 80 fL (ref 79.3–98.0)
MONO#: 0.4 10*3/uL (ref 0.1–0.9)
MONO%: 18.8 % — AB (ref 0.0–14.0)
NEUT#: 1.2 10*3/uL — ABNORMAL LOW (ref 1.5–6.5)
NEUT%: 59.9 % (ref 39.0–75.0)
Platelets: 122 10*3/uL — ABNORMAL LOW (ref 140–400)
RBC: 4.76 10*6/uL (ref 4.20–5.82)
RDW: 19.6 % — ABNORMAL HIGH (ref 11.0–14.6)
WBC: 2 10*3/uL — ABNORMAL LOW (ref 4.0–10.3)
lymph#: 0.4 10*3/uL — ABNORMAL LOW (ref 0.9–3.3)

## 2015-02-12 ENCOUNTER — Other Ambulatory Visit: Payer: Self-pay | Admitting: *Deleted

## 2015-02-12 ENCOUNTER — Telehealth: Payer: Self-pay | Admitting: Internal Medicine

## 2015-02-12 DIAGNOSIS — C3490 Malignant neoplasm of unspecified part of unspecified bronchus or lung: Secondary | ICD-10-CM

## 2015-02-12 NOTE — Telephone Encounter (Signed)
s.w. pt wife and advised on 9.12 appt time changed due to MD comming in late....pt ok and aware

## 2015-02-16 ENCOUNTER — Other Ambulatory Visit (HOSPITAL_BASED_OUTPATIENT_CLINIC_OR_DEPARTMENT_OTHER): Payer: Medicare Other

## 2015-02-16 DIAGNOSIS — C787 Secondary malignant neoplasm of liver and intrahepatic bile duct: Secondary | ICD-10-CM | POA: Diagnosis not present

## 2015-02-16 DIAGNOSIS — C3412 Malignant neoplasm of upper lobe, left bronchus or lung: Secondary | ICD-10-CM

## 2015-02-16 DIAGNOSIS — C3490 Malignant neoplasm of unspecified part of unspecified bronchus or lung: Secondary | ICD-10-CM

## 2015-02-16 LAB — COMPREHENSIVE METABOLIC PANEL (CC13)
ALBUMIN: 3.5 g/dL (ref 3.5–5.0)
ALK PHOS: 147 U/L (ref 40–150)
ALT: 19 U/L (ref 0–55)
ANION GAP: 9 meq/L (ref 3–11)
AST: 26 U/L (ref 5–34)
BILIRUBIN TOTAL: 0.33 mg/dL (ref 0.20–1.20)
BUN: 8 mg/dL (ref 7.0–26.0)
CALCIUM: 9 mg/dL (ref 8.4–10.4)
CO2: 25 mEq/L (ref 22–29)
CREATININE: 0.9 mg/dL (ref 0.7–1.3)
Chloride: 108 mEq/L (ref 98–109)
Glucose: 100 mg/dl (ref 70–140)
Potassium: 3.6 mEq/L (ref 3.5–5.1)
Sodium: 142 mEq/L (ref 136–145)
TOTAL PROTEIN: 6.7 g/dL (ref 6.4–8.3)

## 2015-02-16 LAB — CBC WITH DIFFERENTIAL/PLATELET
BASO%: 0.2 % (ref 0.0–2.0)
Basophils Absolute: 0 10*3/uL (ref 0.0–0.1)
EOS ABS: 0 10*3/uL (ref 0.0–0.5)
EOS%: 0.1 % (ref 0.0–7.0)
HEMATOCRIT: 40.1 % (ref 38.4–49.9)
HGB: 13 g/dL (ref 13.0–17.1)
LYMPH#: 0.9 10*3/uL (ref 0.9–3.3)
LYMPH%: 5.6 % — AB (ref 14.0–49.0)
MCH: 25.9 pg — ABNORMAL LOW (ref 27.2–33.4)
MCHC: 32.4 g/dL (ref 32.0–36.0)
MCV: 80 fL (ref 79.3–98.0)
MONO#: 0.7 10*3/uL (ref 0.1–0.9)
MONO%: 4.6 % (ref 0.0–14.0)
NEUT%: 89.5 % — AB (ref 39.0–75.0)
NEUTROS ABS: 14 10*3/uL — AB (ref 1.5–6.5)
PLATELETS: 244 10*3/uL (ref 140–400)
RBC: 5.01 10*6/uL (ref 4.20–5.82)
RDW: 22.2 % — ABNORMAL HIGH (ref 11.0–14.6)
WBC: 15.6 10*3/uL — AB (ref 4.0–10.3)

## 2015-02-19 ENCOUNTER — Other Ambulatory Visit: Payer: Self-pay | Admitting: Medical Oncology

## 2015-02-19 DIAGNOSIS — C3490 Malignant neoplasm of unspecified part of unspecified bronchus or lung: Secondary | ICD-10-CM

## 2015-02-22 ENCOUNTER — Encounter: Payer: Self-pay | Admitting: Internal Medicine

## 2015-02-22 ENCOUNTER — Other Ambulatory Visit: Payer: Medicare Other

## 2015-02-22 ENCOUNTER — Other Ambulatory Visit (HOSPITAL_BASED_OUTPATIENT_CLINIC_OR_DEPARTMENT_OTHER): Payer: Medicare Other

## 2015-02-22 ENCOUNTER — Ambulatory Visit (HOSPITAL_BASED_OUTPATIENT_CLINIC_OR_DEPARTMENT_OTHER): Payer: Medicare Other | Admitting: Internal Medicine

## 2015-02-22 ENCOUNTER — Telehealth: Payer: Self-pay | Admitting: *Deleted

## 2015-02-22 ENCOUNTER — Ambulatory Visit: Payer: Medicare Other

## 2015-02-22 ENCOUNTER — Ambulatory Visit: Payer: Medicare Other | Admitting: Internal Medicine

## 2015-02-22 ENCOUNTER — Telehealth: Payer: Self-pay | Admitting: Internal Medicine

## 2015-02-22 VITALS — BP 150/86 | HR 94 | Temp 98.6°F | Resp 18 | Ht 66.0 in | Wt 162.6 lb

## 2015-02-22 DIAGNOSIS — C3490 Malignant neoplasm of unspecified part of unspecified bronchus or lung: Secondary | ICD-10-CM

## 2015-02-22 DIAGNOSIS — C787 Secondary malignant neoplasm of liver and intrahepatic bile duct: Secondary | ICD-10-CM | POA: Diagnosis not present

## 2015-02-22 DIAGNOSIS — Z8521 Personal history of malignant neoplasm of larynx: Secondary | ICD-10-CM

## 2015-02-22 DIAGNOSIS — C3411 Malignant neoplasm of upper lobe, right bronchus or lung: Secondary | ICD-10-CM | POA: Diagnosis not present

## 2015-02-22 DIAGNOSIS — C3412 Malignant neoplasm of upper lobe, left bronchus or lung: Secondary | ICD-10-CM | POA: Diagnosis not present

## 2015-02-22 DIAGNOSIS — C329 Malignant neoplasm of larynx, unspecified: Secondary | ICD-10-CM

## 2015-02-22 LAB — CBC WITH DIFFERENTIAL/PLATELET
BASO%: 0.5 % (ref 0.0–2.0)
BASOS ABS: 0 10*3/uL (ref 0.0–0.1)
EOS%: 0.3 % (ref 0.0–7.0)
Eosinophils Absolute: 0 10*3/uL (ref 0.0–0.5)
HEMATOCRIT: 40 % (ref 38.4–49.9)
HEMOGLOBIN: 13 g/dL (ref 13.0–17.1)
LYMPH#: 1.2 10*3/uL (ref 0.9–3.3)
LYMPH%: 14.6 % (ref 14.0–49.0)
MCH: 26.8 pg — AB (ref 27.2–33.4)
MCHC: 32.5 g/dL (ref 32.0–36.0)
MCV: 82.5 fL (ref 79.3–98.0)
MONO#: 0.6 10*3/uL (ref 0.1–0.9)
MONO%: 6.9 % (ref 0.0–14.0)
NEUT%: 77.7 % — ABNORMAL HIGH (ref 39.0–75.0)
NEUTROS ABS: 6.2 10*3/uL (ref 1.5–6.5)
Platelets: 204 10*3/uL (ref 140–400)
RBC: 4.85 10*6/uL (ref 4.20–5.82)
RDW: 20.9 % — AB (ref 11.0–14.6)
WBC: 8 10*3/uL (ref 4.0–10.3)

## 2015-02-22 LAB — COMPREHENSIVE METABOLIC PANEL (CC13)
ALBUMIN: 3.4 g/dL — AB (ref 3.5–5.0)
ALK PHOS: 129 U/L (ref 40–150)
ALT: 17 U/L (ref 0–55)
AST: 27 U/L (ref 5–34)
Anion Gap: 7 mEq/L (ref 3–11)
BILIRUBIN TOTAL: 0.35 mg/dL (ref 0.20–1.20)
BUN: 9.3 mg/dL (ref 7.0–26.0)
CALCIUM: 9.3 mg/dL (ref 8.4–10.4)
CO2: 26 mEq/L (ref 22–29)
CREATININE: 0.8 mg/dL (ref 0.7–1.3)
Chloride: 107 mEq/L (ref 98–109)
EGFR: >90 mL/min/{1.73_m2} (ref >90)
GLUCOSE: 98 mg/dL (ref 70–140)
Potassium: 4.2 mEq/L (ref 3.5–5.1)
Sodium: 140 mEq/L (ref 136–145)
TOTAL PROTEIN: 6.8 g/dL (ref 6.4–8.3)

## 2015-02-22 NOTE — Telephone Encounter (Signed)
Per staff message and POF I have scheduled appts. Advised scheduler of appts and to move labs. JMW  

## 2015-02-22 NOTE — Progress Notes (Signed)
[facility] Telephone:(336) [phone]   [phone]  OFFICE PROGRESS NOTE  Olga Millers, MD [place] [address]  PRINCIPAL DIAGNOSES:  1. 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 2.  metastatic non-small cell lung cancer initially diagnosed as Stage IA non-small-cell lung cancer diagnosed in June 2011.  PRIOR THERAPY:  1. Status post total laryngectomy with. Bilateral selective lymph node dissection as well as left superior parotidectomy with nerve dissection under the care of Dr. Redmond Baseman with positive resection margin. 2. Status post course of concurrent chemoradiation with weekly cisplatin. Last dose of chemotherapy was given January 08, 2007. 3. Status post curative radiotherapy with tomotherapy to the lung lesions completed December 31, 2009 under the care of Dr. Pablo Ledger.  CURRENT THERAPY: Systemic chemotherapy with carboplatin for an AUC of 5 and paclitaxel 175 mg meter squared. Status post 3 cycles  INTERVAL HISTORY: [name] 74 y.o. male returns to the clinic today for follow-up visit accompanied by his wife.  The patient is currently undergoing systemic chemotherapy with carboplatin and paclitaxel is status post 3 cycles and tolerating his treatment fairly well with no significant adverse effects except for the aching pain and fatigue after the Neulasta injection. He denied having any significant nausea or vomiting, no fever or chills. He denied having any significant chest pain, shortness of breath, cough or hemoptysis. He is scheduled to have repeat CT scan of the chest tomorrow. He was supposed to start cycle #4 of his systemic chemotherapy today.   MEDICAL HISTORY: Past Medical History  Diagnosis Date  . Hypertension   . 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 Cancer 2009    SUPERGLOTTIS INVASIVE SQ CELL CA    ALLERGIES:  has No Known Allergies.  MEDICATIONS:  Current  Outpatient Prescriptions  Medication Sig Dispense Refill  . albuterol (PROVENTIL) (2.5 MG/3ML) 0.083% nebulizer solution Take 2.5 mg by nebulization every 6 (six) hours as needed for wheezing or shortness of breath.    Marland Kitchen amLODipine (NORVASC) 5 MG tablet Take 1 tablet (5 mg total) by mouth at bedtime. 90 tablet 3  . aspirin 81 MG tablet Take 81 mg by mouth every morning.     . baclofen (LIORESAL) 10 MG tablet Take 1 tablet (10 mg total) by mouth at bedtime. 90 each 3  . cholecalciferol (VITAMIN D) 1000 UNITS tablet Take 1,000 Units by mouth every morning.     . finasteride (PROSCAR) 5 MG tablet Take 1 tablet (5 mg total) by mouth every morning. 90 tablet 3  . metoprolol succinate (TOPROL-XL) 25 MG 24 hr tablet Take 1 tablet (25 mg total) by mouth every morning. 90 tablet 3  . prochlorperazine (COMPAZINE) 10 MG tablet Take 1 tablet (10 mg total) by mouth every 6 (six) hours as needed for nausea or vomiting. 30 tablet 0  . simvastatin (ZOCOR) 20 MG tablet Take 1 tablet (20 mg total) by mouth every evening. 30 tablet 1  . tiZANidine (ZANAFLEX) 4 MG tablet Take 1 tablet (4 mg total) by mouth 3 (three) times daily as needed for muscle spasms. 90 tablet 1   No current facility-administered medications for this visit.    SURGICAL HISTORY:  Past Surgical History  Procedure Laterality Date  . Tracheostomy  2009    REVIEW OF SYSTEMS:  A comprehensive review of systems was negative except for: Constitutional: positive for fatigue Musculoskeletal: positive for  muscle weakness   PHYSICAL EXAMINATION: General appearance: alert, cooperative, fatigued and no distress Head: Normocephalic, without obvious abnormality, atraumatic Neck: no adenopathy, no JVD, supple, symmetrical, trachea midline and thyroid not enlarged, symmetric, no tenderness/mass/nodules Lymph nodes: Cervical, supraclavicular, and axillary nodes normal. Resp: clear to auscultation bilaterally Back: symmetric, no curvature. ROM normal.  No CVA tenderness. Cardio: regular rate and rhythm, S1, S2 normal, no murmur, click, rub or gallop GI: soft, non-tender; bowel sounds normal; no masses,  no organomegaly Extremities: extremities normal, atraumatic, no cyanosis or edema  ECOG PERFORMANCE STATUS: 1 - Symptomatic but completely ambulatory  Blood pressure 150/86, pulse 94, temperature 98.6 F (37 C), temperature source Oral, resp. rate 18, height '5\' 6"'$  (1.676 m), weight 162 lb 9.3 oz (73.746 kg), SpO2 99 %.  LABORATORY DATA: Lab Results  Component Value Date   WBC 8.0 02/22/2015   HGB 13.0 02/22/2015   HCT 40.0 02/22/2015   MCV 82.5 02/22/2015   PLT 204 02/22/2015      Chemistry      Component Value Date/Time   NA 140 02/22/2015 0917   NA 139 01/19/2012 0942   NA 141 03/31/2010 1005   K 4.2 02/22/2015 0917   K 4.1 01/19/2012 0942   K 3.7 03/31/2010 1005   CL 103 08/02/2012 1107   CL 96* 01/19/2012 0942   CL 105 03/31/2010 1005   CO2 26 02/22/2015 0917   CO2 28 01/19/2012 0942   CO2 31 03/31/2010 1005   BUN 9.3 02/22/2015 0917   BUN 14 01/19/2012 0942   BUN 12 03/31/2010 1005   CREATININE 0.8 02/22/2015 0917   CREATININE 1.2 01/19/2012 0942   CREATININE 1.02 03/31/2010 1005      Component Value Date/Time   CALCIUM 9.3 02/22/2015 0917   CALCIUM 9.5 01/19/2012 0942   CALCIUM 9.0 03/31/2010 1005   ALKPHOS 129 02/22/2015 0917   ALKPHOS 64 01/19/2012 0942   ALKPHOS 60 03/31/2010 1005   AST 27 02/22/2015 0917   AST 27 01/19/2012 0942   AST 21 03/31/2010 1005   ALT 17 02/22/2015 0917   ALT 23 01/19/2012 0942   ALT 19 03/31/2010 1005   BILITOT 0.35 02/22/2015 0917   BILITOT 0.50 01/19/2012 0942   BILITOT 0.4 03/31/2010 1005       RADIOGRAPHIC STUDIES: No results found.  ASSESSMENT AND PLAN:  This is a very pleasant 74 years old African-American male with metastatic non-small cell lung cancer , squamous cell carcinoma currently undergoing systemic chemotherapy with carboplatin and paclitaxel is  status post 3 cycles. He was supposed to start cycle #4 today but unfortunately his restaging scan as a scheduled for tomorrow.  I recommended for the patient to delay the start of cycle #4 until 02/24/2015 to have the restaging scan available before making the decision to proceed with his systemic chemotherapy.  if his scan showed no evidence for disease progression, the patient will start cycle #4 as scheduled in 2 days and he would come back for follow-up visit in 3 weeks with the start of cycle #5.  If the scan showed any evidence for disease progression, I will change his treatment plan and I'll bring the patient back for follow-up visit in one week for reevaluation and discussion of his new systemic treatment or palliative care.  The patient and his wife agreed to the current plan.  He was advised to call immediately if he has any concerning symptoms in the interval. The patient voices understanding of current disease status  and treatment options and is in agreement with the current care plan.  All questions were answered. The patient knows to call the clinic with any problems, questions or concerns. We can certainly see the patient much sooner if necessary.  Disclaimer: This note was dictated with voice recognition software. Similar sounding words can inadvertently be transcribed and may not be corrected upon review.

## 2015-02-22 NOTE — Telephone Encounter (Signed)
per pof to sch pt appt-sent Dr Julien Nordmann email to adv no openings on 10/5-awaiting reply-sent MW email to r/s trmt to 9/14 per Dr Tonita Cong reply-gave pt copy of avs

## 2015-02-23 ENCOUNTER — Ambulatory Visit (HOSPITAL_COMMUNITY)
Admission: RE | Admit: 2015-02-23 | Discharge: 2015-02-23 | Disposition: A | Discharge status: Home / Self Care | Payer: Medicare Other | Source: Ambulatory Visit | Attending: Physician Assistant | Admitting: Physician Assistant

## 2015-02-23 ENCOUNTER — Ambulatory Visit: Payer: Medicare Other

## 2015-02-23 ENCOUNTER — Encounter (HOSPITAL_COMMUNITY): Payer: Self-pay

## 2015-02-23 ENCOUNTER — Telehealth: Payer: Self-pay | Admitting: Internal Medicine

## 2015-02-23 DIAGNOSIS — I2510 Atherosclerotic heart disease of native coronary artery without angina pectoris: Secondary | ICD-10-CM | POA: Diagnosis not present

## 2015-02-23 DIAGNOSIS — K5730 Diverticulosis of large intestine without perforation or abscess without bleeding: Secondary | ICD-10-CM | POA: Insufficient documentation

## 2015-02-23 DIAGNOSIS — C329 Malignant neoplasm of larynx, unspecified: Secondary | ICD-10-CM | POA: Diagnosis not present

## 2015-02-23 DIAGNOSIS — I700 Atherosclerosis of aorta: Secondary | ICD-10-CM | POA: Insufficient documentation

## 2015-02-23 DIAGNOSIS — R911 Solitary pulmonary nodule: Secondary | ICD-10-CM | POA: Insufficient documentation

## 2015-02-23 DIAGNOSIS — Z930 Tracheostomy status: Secondary | ICD-10-CM | POA: Diagnosis not present

## 2015-02-23 DIAGNOSIS — I714 Abdominal aortic aneurysm, without rupture: Secondary | ICD-10-CM | POA: Insufficient documentation

## 2015-02-23 DIAGNOSIS — C3490 Malignant neoplasm of unspecified part of unspecified bronchus or lung: Secondary | ICD-10-CM | POA: Diagnosis present

## 2015-02-23 DIAGNOSIS — C787 Secondary malignant neoplasm of liver and intrahepatic bile duct: Secondary | ICD-10-CM | POA: Diagnosis not present

## 2015-02-23 MED ORDER — IOHEXOL 300 MG/ML  SOLN
100.0000 mL | Freq: Once | INTRAMUSCULAR | Status: AC | PRN
  Administered 2015-02-23: 100 mL via INTRAVENOUS

## 2015-02-23 NOTE — Telephone Encounter (Signed)
perp of to sch pt appt-per reply from Dr Julien Nordmann to use MD only slots-daughter came by and to see what time chemo was for 9/14-[name] scheduled for correct date-gave daughtercorrect time-will give daugter completed sch 9/14 in trmt room

## 2015-02-24 ENCOUNTER — Other Ambulatory Visit: Payer: Medicare Other

## 2015-02-24 ENCOUNTER — Ambulatory Visit (HOSPITAL_BASED_OUTPATIENT_CLINIC_OR_DEPARTMENT_OTHER): Payer: Medicare Other

## 2015-02-24 DIAGNOSIS — C3410 Malignant neoplasm of upper lobe, unspecified bronchus or lung: Secondary | ICD-10-CM

## 2015-02-24 DIAGNOSIS — C787 Secondary malignant neoplasm of liver and intrahepatic bile duct: Secondary | ICD-10-CM

## 2015-02-24 DIAGNOSIS — Z5111 Encounter for antineoplastic chemotherapy: Secondary | ICD-10-CM | POA: Diagnosis not present

## 2015-02-24 DIAGNOSIS — C3411 Malignant neoplasm of upper lobe, right bronchus or lung: Secondary | ICD-10-CM | POA: Diagnosis not present

## 2015-02-24 MED ORDER — DIPHENHYDRAMINE HCL 50 MG/ML IJ SOLN
50.0000 mg | Freq: Once | INTRAMUSCULAR | Status: AC
  Administered 2015-02-24: 50 mg via INTRAVENOUS

## 2015-02-24 MED ORDER — FAMOTIDINE IN NACL 20-0.9 MG/50ML-% IV SOLN
20.0000 mg | Freq: Once | INTRAVENOUS | Status: AC
  Administered 2015-02-24: 20 mg via INTRAVENOUS

## 2015-02-24 MED ORDER — DIPHENHYDRAMINE HCL 50 MG/ML IJ SOLN
INTRAMUSCULAR | Status: AC
  Filled 2015-02-24: qty 1

## 2015-02-24 MED ORDER — SODIUM CHLORIDE 0.9 % IV SOLN
Freq: Once | INTRAVENOUS | Status: AC
  Administered 2015-02-24: 14:00:00 via INTRAVENOUS
  Filled 2015-02-24: qty 8

## 2015-02-24 MED ORDER — CARBOPLATIN CHEMO INJECTION 600 MG/60ML
493.0000 mg | Freq: Once | INTRAVENOUS | Status: AC
  Administered 2015-02-24: 490 mg via INTRAVENOUS
  Filled 2015-02-24: qty 49

## 2015-02-24 MED ORDER — SODIUM CHLORIDE 0.9 % IV SOLN
Freq: Once | INTRAVENOUS | Status: AC
  Administered 2015-02-24: 13:00:00 via INTRAVENOUS

## 2015-02-24 MED ORDER — PACLITAXEL CHEMO INJECTION 300 MG/50ML
175.0000 mg/m2 | Freq: Once | INTRAVENOUS | Status: AC
  Administered 2015-02-24: 336 mg via INTRAVENOUS
  Filled 2015-02-24: qty 56

## 2015-02-24 MED ORDER — FAMOTIDINE IN NACL 20-0.9 MG/50ML-% IV SOLN
INTRAVENOUS | Status: AC
  Filled 2015-02-24: qty 50

## 2015-02-24 NOTE — Patient Instructions (Signed)
Paclitaxel injection What is this medicine? PACLITAXEL (PAK li TAX el) is a chemotherapy drug. It targets fast dividing cells, like cancer cells, and causes these cells to die. This medicine is used to treat ovarian cancer, breast cancer, and other cancers. This medicine may be used for other purposes; ask your health care provider or pharmacist if you have questions. COMMON BRAND NAME(S): Onxol, Taxol What should I tell my health care provider before I take this medicine? They need to know if you have any of these conditions: -blood disorders -irregular heartbeat -infection (especially a virus infection such as chickenpox, cold sores, or herpes) -liver disease -previous or ongoing radiation therapy -an unusual or allergic reaction to paclitaxel, alcohol, polyoxyethylated castor oil, other chemotherapy agents, other medicines, foods, dyes, or preservatives -pregnant or trying to get pregnant -breast-feeding How should I use this medicine? This drug is given as an infusion into a vein. It is administered in a hospital or clinic by a specially trained health care professional. Talk to your pediatrician regarding the use of this medicine in children. Special care may be needed. Overdosage: If you think you have taken too much of this medicine contact a poison control center or emergency room at once. NOTE: This medicine is only for you. Do not share this medicine with others. What if I miss a dose? It is important not to miss your dose. Call your doctor or health care professional if you are unable to keep an appointment. What may interact with this medicine? Do not take this medicine with any of the following medications: -disulfiram -metronidazole This medicine may also interact with the following medications: -cyclosporine -diazepam -ketoconazole -medicines to increase blood counts like filgrastim, pegfilgrastim, sargramostim -other chemotherapy drugs like cisplatin, doxorubicin,  epirubicin, etoposide, teniposide, vincristine -quinidine -testosterone -vaccines -verapamil Talk to your doctor or health care professional before taking any of these medicines: -acetaminophen -aspirin -ibuprofen -ketoprofen -naproxen This list may not describe all possible interactions. Give your health care provider a list of all the medicines, herbs, non-prescription drugs, or dietary supplements you use. Also tell them if you smoke, drink alcohol, or use illegal drugs. Some items may interact with your medicine. What should I watch for while using this medicine? Your condition will be monitored carefully while you are receiving this medicine. You will need important blood work done while you are taking this medicine. This drug may make you feel generally unwell. This is not uncommon, as chemotherapy can affect healthy cells as well as cancer cells. Report any side effects. Continue your course of treatment even though you feel ill unless your doctor tells you to stop. In some cases, you may be given additional medicines to help with side effects. Follow all directions for their use. Call your doctor or health care professional for advice if you get a fever, chills or sore throat, or other symptoms of a cold or flu. Do not treat yourself. This drug decreases your body's ability to fight infections. Try to avoid being around people who are sick. This medicine may increase your risk to bruise or bleed. Call your doctor or health care professional if you notice any unusual bleeding. Be careful brushing and flossing your teeth or using a toothpick because you may get an infection or bleed more easily. If you have any dental work done, tell your dentist you are receiving this medicine. Avoid taking products that contain aspirin, acetaminophen, ibuprofen, naproxen, or ketoprofen unless instructed by your doctor. These medicines may hide a fever.   Do not become pregnant while taking this medicine.  Women should inform their doctor if they wish to become pregnant or think they might be pregnant. There is a potential for serious side effects to an unborn child. Talk to your health care professional or pharmacist for more information. Do not breast-feed an infant while taking this medicine. Men are advised not to father a child while receiving this medicine. What side effects may I notice from receiving this medicine? Side effects that you should report to your doctor or health care professional as soon as possible: -allergic reactions like skin rash, itching or hives, swelling of the face, lips, or tongue -low blood counts - This drug may decrease the number of white blood cells, red blood cells and platelets. You may be at increased risk for infections and bleeding. -signs of infection - fever or chills, cough, sore throat, pain or difficulty passing urine -signs of decreased platelets or bleeding - bruising, pinpoint red spots on the skin, black, tarry stools, nosebleeds -signs of decreased red blood cells - unusually weak or tired, fainting spells, lightheadedness -breathing problems -chest pain -high or low blood pressure -mouth sores -nausea and vomiting -pain, swelling, redness or irritation at the injection site -pain, tingling, numbness in the hands or feet -slow or irregular heartbeat -swelling of the ankle, feet, hands Side effects that usually do not require medical attention (report to your doctor or health care professional if they continue or are bothersome): -bone pain -complete hair loss including hair on your head, underarms, pubic hair, eyebrows, and eyelashes -changes in the color of fingernails -diarrhea -loosening of the fingernails -loss of appetite -muscle or joint pain -red flush to skin -sweating This list may not describe all possible side effects. Call your doctor for medical advice about side effects. You may report side effects to FDA at  1-800-FDA-1088. Where should I keep my medicine? This drug is given in a hospital or clinic and will not be stored at home. NOTE: This sheet is a summary. It may not cover all possible information. If you have questions about this medicine, talk to your doctor, pharmacist, or health care provider.  2015, [employer] Standard. (2012-07-22 16:41:21)  Carboplatin injection What is this medicine? CARBOPLATIN (KAR boe pla tin) is a chemotherapy drug. It targets fast dividing cells, like cancer cells, and causes these cells to die. This medicine is used to treat ovarian cancer and many other cancers. This medicine may be used for other purposes; ask your health care provider or pharmacist if you have questions. COMMON BRAND NAME(S): Paraplatin What should I tell my health care provider before I take this medicine? They need to know if you have any of these conditions: -blood disorders -hearing problems -kidney disease -recent or ongoing radiation therapy -an unusual or allergic reaction to carboplatin, cisplatin, other chemotherapy, other medicines, foods, dyes, or preservatives -pregnant or trying to get pregnant -breast-feeding How should I use this medicine? This drug is usually given as an infusion into a vein. It is administered in a hospital or clinic by a specially trained health care professional. Talk to your pediatrician regarding the use of this medicine in children. Special care may be needed. Overdosage: If you think you have taken too much of this medicine contact a poison control center or emergency room at once. NOTE: This medicine is only for you. Do not share this medicine with others. What if I miss a dose? It is important not to miss a dose. Call your   doctor or health care professional if you are unable to keep an appointment. What may interact with this medicine? -medicines for seizures -medicines to increase blood counts like filgrastim, pegfilgrastim,  sargramostim -some antibiotics like amikacin, gentamicin, neomycin, streptomycin, tobramycin -vaccines Talk to your doctor or health care professional before taking any of these medicines: -acetaminophen -aspirin -ibuprofen -ketoprofen -naproxen This list may not describe all possible interactions. Give your health care provider a list of all the medicines, herbs, non-prescription drugs, or dietary supplements you use. Also tell them if you smoke, drink alcohol, or use illegal drugs. Some items may interact with your medicine. What should I watch for while using this medicine? Your condition will be monitored carefully while you are receiving this medicine. You will need important blood work done while you are taking this medicine. This drug may make you feel generally unwell. This is not uncommon, as chemotherapy can affect healthy cells as well as cancer cells. Report any side effects. Continue your course of treatment even though you feel ill unless your doctor tells you to stop. In some cases, you may be given additional medicines to help with side effects. Follow all directions for their use. Call your doctor or health care professional for advice if you get a fever, chills or sore throat, or other symptoms of a cold or flu. Do not treat yourself. This drug decreases your body's ability to fight infections. Try to avoid being around people who are sick. This medicine may increase your risk to bruise or bleed. Call your doctor or health care professional if you notice any unusual bleeding. Be careful brushing and flossing your teeth or using a toothpick because you may get an infection or bleed more easily. If you have any dental work done, tell your dentist you are receiving this medicine. Avoid taking products that contain aspirin, acetaminophen, ibuprofen, naproxen, or ketoprofen unless instructed by your doctor. These medicines may hide a fever. Do not become pregnant while taking this  medicine. Women should inform their doctor if they wish to become pregnant or think they might be pregnant. There is a potential for serious side effects to an unborn child. Talk to your health care professional or pharmacist for more information. Do not breast-feed an infant while taking this medicine. What side effects may I notice from receiving this medicine? Side effects that you should report to your doctor or health care professional as soon as possible: -allergic reactions like skin rash, itching or hives, swelling of the face, lips, or tongue -signs of infection - fever or chills, cough, sore throat, pain or difficulty passing urine -signs of decreased platelets or bleeding - bruising, pinpoint red spots on the skin, black, tarry stools, nosebleeds -signs of decreased red blood cells - unusually weak or tired, fainting spells, lightheadedness -breathing problems -changes in hearing -changes in vision -chest pain -high blood pressure -low blood counts - This drug may decrease the number of white blood cells, red blood cells and platelets. You may be at increased risk for infections and bleeding. -nausea and vomiting -pain, swelling, redness or irritation at the injection site -pain, tingling, numbness in the hands or feet -problems with balance, talking, walking -trouble passing urine or change in the amount of urine Side effects that usually do not require medical attention (report to your doctor or health care professional if they continue or are bothersome): -hair loss -loss of appetite -metallic taste in the mouth or changes in taste This list may not describe all   possible side effects. Call your doctor for medical advice about side effects. You may report side effects to FDA at 1-800-FDA-1088. Where should I keep my medicine? This drug is given in a hospital or clinic and will not be stored at home. NOTE: This sheet is a summary. It may not cover all possible information. If you  have questions about this medicine, talk to your doctor, pharmacist, or health care provider.  2015, [employer] Standard. (2007-09-03 14:38:05)  

## 2015-02-26 ENCOUNTER — Ambulatory Visit (HOSPITAL_BASED_OUTPATIENT_CLINIC_OR_DEPARTMENT_OTHER): Payer: Medicare Other

## 2015-02-26 ENCOUNTER — Ambulatory Visit: Payer: Medicare Other

## 2015-02-26 VITALS — BP 101/69 | HR 85 | Temp 99.2°F

## 2015-02-26 DIAGNOSIS — C3410 Malignant neoplasm of upper lobe, unspecified bronchus or lung: Secondary | ICD-10-CM | POA: Diagnosis not present

## 2015-02-26 DIAGNOSIS — Z5189 Encounter for other specified aftercare: Secondary | ICD-10-CM

## 2015-02-26 MED ORDER — PEGFILGRASTIM INJECTION 6 MG/0.6ML ~~LOC~~
6.0000 mg | PREFILLED_SYRINGE | Freq: Once | SUBCUTANEOUS | Status: AC
Start: 2015-02-26 — End: 2015-02-26
  Administered 2015-02-26: 6 mg via SUBCUTANEOUS
  Filled 2015-02-26: qty 0.6

## 2015-03-01 ENCOUNTER — Other Ambulatory Visit: Payer: Medicare Other

## 2015-03-01 ENCOUNTER — Ambulatory Visit: Payer: Medicare Other

## 2015-03-02 ENCOUNTER — Ambulatory Visit: Payer: Medicare Other

## 2015-03-02 ENCOUNTER — Other Ambulatory Visit: Payer: Self-pay | Admitting: Medical Oncology

## 2015-03-02 DIAGNOSIS — C3491 Malignant neoplasm of unspecified part of right bronchus or lung: Secondary | ICD-10-CM

## 2015-03-02 MED ORDER — FAMOTIDINE IN NACL 20-0.9 MG/50ML-% IV SOLN
INTRAVENOUS | Status: AC
  Filled 2015-03-02: qty 50

## 2015-03-02 MED ORDER — DIPHENHYDRAMINE HCL 50 MG/ML IJ SOLN
INTRAMUSCULAR | Status: AC
  Filled 2015-03-02: qty 1

## 2015-03-03 ENCOUNTER — Other Ambulatory Visit (HOSPITAL_BASED_OUTPATIENT_CLINIC_OR_DEPARTMENT_OTHER): Payer: Medicare Other

## 2015-03-03 DIAGNOSIS — C3491 Malignant neoplasm of unspecified part of right bronchus or lung: Secondary | ICD-10-CM | POA: Diagnosis not present

## 2015-03-03 LAB — CBC WITH DIFFERENTIAL/PLATELET
BASO%: 0.8 % (ref 0.0–2.0)
BASOS ABS: 0 10*3/uL (ref 0.0–0.1)
EOS ABS: 0 10*3/uL (ref 0.0–0.5)
EOS%: 0.6 % (ref 0.0–7.0)
HCT: 37.7 % — ABNORMAL LOW (ref 38.4–49.9)
HGB: 12.3 g/dL — ABNORMAL LOW (ref 13.0–17.1)
LYMPH%: 14.6 % (ref 14.0–49.0)
MCH: 26.7 pg — ABNORMAL LOW (ref 27.2–33.4)
MCHC: 32.5 g/dL (ref 32.0–36.0)
MCV: 82 fL (ref 79.3–98.0)
MONO#: 0.5 10*3/uL (ref 0.1–0.9)
MONO%: 10.4 % (ref 0.0–14.0)
NEUT%: 73.6 % (ref 39.0–75.0)
NEUTROS ABS: 3.6 10*3/uL (ref 1.5–6.5)
PLATELETS: 130 10*3/uL — AB (ref 140–400)
RBC: 4.6 10*6/uL (ref 4.20–5.82)
RDW: 21.7 % — ABNORMAL HIGH (ref 11.0–14.6)
WBC: 4.8 10*3/uL (ref 4.0–10.3)
lymph#: 0.7 10*3/uL — ABNORMAL LOW (ref 0.9–3.3)

## 2015-03-03 LAB — COMPREHENSIVE METABOLIC PANEL (CC13)
ALT: 17 U/L (ref 0–55)
AST: 24 U/L (ref 5–34)
Albumin: 3.5 g/dL (ref 3.5–5.0)
Alkaline Phosphatase: 129 U/L (ref 40–150)
Anion Gap: 10 mEq/L (ref 3–11)
BUN: 16 mg/dL (ref 7.0–26.0)
CALCIUM: 9.2 mg/dL (ref 8.4–10.4)
CHLORIDE: 104 meq/L (ref 98–109)
CO2: 24 meq/L (ref 22–29)
Creatinine: 0.8 mg/dL (ref 0.7–1.3)
EGFR: >90 mL/min/{1.73_m2} (ref >90)
GLUCOSE: 106 mg/dL (ref 70–140)
POTASSIUM: 3.9 meq/L (ref 3.5–5.1)
SODIUM: 138 meq/L (ref 136–145)
Total Bilirubin: 0.5 mg/dL (ref 0.20–1.20)
Total Protein: 6.9 g/dL (ref 6.4–8.3)

## 2015-03-08 ENCOUNTER — Other Ambulatory Visit: Payer: Medicare Other

## 2015-03-09 ENCOUNTER — Other Ambulatory Visit: Payer: Self-pay | Admitting: *Deleted

## 2015-03-09 DIAGNOSIS — C3491 Malignant neoplasm of unspecified part of right bronchus or lung: Secondary | ICD-10-CM

## 2015-03-10 ENCOUNTER — Other Ambulatory Visit (HOSPITAL_BASED_OUTPATIENT_CLINIC_OR_DEPARTMENT_OTHER): Payer: Medicare Other

## 2015-03-10 DIAGNOSIS — C3491 Malignant neoplasm of unspecified part of right bronchus or lung: Secondary | ICD-10-CM | POA: Diagnosis not present

## 2015-03-10 LAB — COMPREHENSIVE METABOLIC PANEL (CC13)
ALBUMIN: 3.5 g/dL (ref 3.5–5.0)
ALT: 11 U/L (ref 0–55)
AST: 20 U/L (ref 5–34)
Alkaline Phosphatase: 139 U/L (ref 40–150)
Anion Gap: 9 mEq/L (ref 3–11)
BUN: 8.4 mg/dL (ref 7.0–26.0)
CO2: 23 meq/L (ref 22–29)
Calcium: 9 mg/dL (ref 8.4–10.4)
Chloride: 110 mEq/L — ABNORMAL HIGH (ref 98–109)
Creatinine: 0.8 mg/dL (ref 0.7–1.3)
EGFR: >90 mL/min/{1.73_m2} (ref >90)
GLUCOSE: 96 mg/dL (ref 70–140)
POTASSIUM: 3.9 meq/L (ref 3.5–5.1)
SODIUM: 142 meq/L (ref 136–145)
TOTAL PROTEIN: 6.9 g/dL (ref 6.4–8.3)
Total Bilirubin: 0.32 mg/dL (ref 0.20–1.20)

## 2015-03-10 LAB — CBC WITH DIFFERENTIAL/PLATELET
BASO%: 0.5 % (ref 0.0–2.0)
Basophils Absolute: 0.1 10*3/uL (ref 0.0–0.1)
EOS%: 0.2 % (ref 0.0–7.0)
Eosinophils Absolute: 0 10*3/uL (ref 0.0–0.5)
HCT: 39.7 % (ref 38.4–49.9)
HEMOGLOBIN: 13 g/dL (ref 13.0–17.1)
LYMPH%: 11.1 % — ABNORMAL LOW (ref 14.0–49.0)
MCH: 27 pg — AB (ref 27.2–33.4)
MCHC: 32.7 g/dL (ref 32.0–36.0)
MCV: 82.8 fL (ref 79.3–98.0)
MONO#: 0.4 10*3/uL (ref 0.1–0.9)
MONO%: 3.5 % (ref 0.0–14.0)
NEUT%: 84.7 % — ABNORMAL HIGH (ref 39.0–75.0)
NEUTROS ABS: 9.4 10*3/uL — AB (ref 1.5–6.5)
Platelets: 212 10*3/uL (ref 140–400)
RBC: 4.8 10*6/uL (ref 4.20–5.82)
RDW: 22.4 % — AB (ref 11.0–14.6)
WBC: 11 10*3/uL — AB (ref 4.0–10.3)
lymph#: 1.2 10*3/uL (ref 0.9–3.3)

## 2015-03-17 ENCOUNTER — Other Ambulatory Visit: Payer: Self-pay | Admitting: Medical Oncology

## 2015-03-17 ENCOUNTER — Ambulatory Visit (HOSPITAL_BASED_OUTPATIENT_CLINIC_OR_DEPARTMENT_OTHER): Payer: Medicare Other | Admitting: Internal Medicine

## 2015-03-17 ENCOUNTER — Ambulatory Visit (HOSPITAL_BASED_OUTPATIENT_CLINIC_OR_DEPARTMENT_OTHER): Payer: Medicare Other

## 2015-03-17 ENCOUNTER — Telehealth: Payer: Self-pay | Admitting: Internal Medicine

## 2015-03-17 ENCOUNTER — Encounter: Payer: Self-pay | Admitting: Internal Medicine

## 2015-03-17 ENCOUNTER — Other Ambulatory Visit (HOSPITAL_BASED_OUTPATIENT_CLINIC_OR_DEPARTMENT_OTHER): Payer: Medicare Other

## 2015-03-17 VITALS — BP 144/82 | HR 100 | Temp 98.3°F | Resp 18 | Ht 66.0 in | Wt 153.1 lb

## 2015-03-17 DIAGNOSIS — C787 Secondary malignant neoplasm of liver and intrahepatic bile duct: Secondary | ICD-10-CM

## 2015-03-17 DIAGNOSIS — Z5111 Encounter for antineoplastic chemotherapy: Secondary | ICD-10-CM

## 2015-03-17 DIAGNOSIS — C3411 Malignant neoplasm of upper lobe, right bronchus or lung: Secondary | ICD-10-CM

## 2015-03-17 DIAGNOSIS — Z8521 Personal history of malignant neoplasm of larynx: Secondary | ICD-10-CM

## 2015-03-17 DIAGNOSIS — C3491 Malignant neoplasm of unspecified part of right bronchus or lung: Secondary | ICD-10-CM

## 2015-03-17 DIAGNOSIS — Z789 Other specified health status: Secondary | ICD-10-CM

## 2015-03-17 HISTORY — DX: Other specified health status: Z78.9

## 2015-03-17 LAB — COMPREHENSIVE METABOLIC PANEL (CC13)
ALBUMIN: 3.5 g/dL (ref 3.5–5.0)
ALK PHOS: 119 U/L (ref 40–150)
ALT: 15 U/L (ref 0–55)
ANION GAP: 9 meq/L (ref 3–11)
AST: 23 U/L (ref 5–34)
BILIRUBIN TOTAL: 0.3 mg/dL (ref 0.20–1.20)
BUN: 12 mg/dL (ref 7.0–26.0)
CO2: 24 meq/L (ref 22–29)
CREATININE: 0.8 mg/dL (ref 0.7–1.3)
Calcium: 9 mg/dL (ref 8.4–10.4)
Chloride: 109 mEq/L (ref 98–109)
GLUCOSE: 100 mg/dL (ref 70–140)
Potassium: 3.9 mEq/L (ref 3.5–5.1)
Sodium: 142 mEq/L (ref 136–145)
TOTAL PROTEIN: 6.9 g/dL (ref 6.4–8.3)

## 2015-03-17 LAB — CBC WITH DIFFERENTIAL/PLATELET
BASO%: 0.5 % (ref 0.0–2.0)
Basophils Absolute: 0 10*3/uL (ref 0.0–0.1)
EOS ABS: 0 10*3/uL (ref 0.0–0.5)
EOS%: 0.3 % (ref 0.0–7.0)
HCT: 39.8 % (ref 38.4–49.9)
HEMOGLOBIN: 13 g/dL (ref 13.0–17.1)
LYMPH#: 1.2 10*3/uL (ref 0.9–3.3)
LYMPH%: 13.1 % — AB (ref 14.0–49.0)
MCH: 27.8 pg (ref 27.2–33.4)
MCHC: 32.7 g/dL (ref 32.0–36.0)
MCV: 85.2 fL (ref 79.3–98.0)
MONO#: 0.7 10*3/uL (ref 0.1–0.9)
MONO%: 7.9 % (ref 0.0–14.0)
NEUT%: 78.2 % — ABNORMAL HIGH (ref 39.0–75.0)
NEUTROS ABS: 6.9 10*3/uL — AB (ref 1.5–6.5)
PLATELETS: 183 10*3/uL (ref 140–400)
RBC: 4.67 10*6/uL (ref 4.20–5.82)
RDW: 20.3 % — AB (ref 11.0–14.6)
WBC: 8.8 10*3/uL (ref 4.0–10.3)

## 2015-03-17 MED ORDER — DIPHENHYDRAMINE HCL 50 MG/ML IJ SOLN
50.0000 mg | Freq: Once | INTRAMUSCULAR | Status: AC
  Administered 2015-03-17: 50 mg via INTRAVENOUS

## 2015-03-17 MED ORDER — SODIUM CHLORIDE 0.9 % IV SOLN
Freq: Once | INTRAVENOUS | Status: AC
  Administered 2015-03-17: 13:00:00 via INTRAVENOUS
  Filled 2015-03-17: qty 8

## 2015-03-17 MED ORDER — SODIUM CHLORIDE 0.9 % IV SOLN
Freq: Once | INTRAVENOUS | Status: AC
  Administered 2015-03-17: 12:00:00 via INTRAVENOUS

## 2015-03-17 MED ORDER — FAMOTIDINE IN NACL 20-0.9 MG/50ML-% IV SOLN
INTRAVENOUS | Status: AC
  Filled 2015-03-17: qty 50

## 2015-03-17 MED ORDER — PACLITAXEL CHEMO INJECTION 300 MG/50ML
175.0000 mg/m2 | Freq: Once | INTRAVENOUS | Status: AC
  Administered 2015-03-17: 336 mg via INTRAVENOUS
  Filled 2015-03-17: qty 56

## 2015-03-17 MED ORDER — FAMOTIDINE IN NACL 20-0.9 MG/50ML-% IV SOLN
20.0000 mg | Freq: Once | INTRAVENOUS | Status: AC
  Administered 2015-03-17: 20 mg via INTRAVENOUS

## 2015-03-17 MED ORDER — CARBOPLATIN CHEMO INJECTION 600 MG/60ML
493.0000 mg | Freq: Once | INTRAVENOUS | Status: AC
  Administered 2015-03-17: 490 mg via INTRAVENOUS
  Filled 2015-03-17: qty 49

## 2015-03-17 MED ORDER — DIPHENHYDRAMINE HCL 50 MG/ML IJ SOLN
INTRAMUSCULAR | Status: AC
  Filled 2015-03-17: qty 1

## 2015-03-17 NOTE — Progress Notes (Signed)
[facility] Telephone:(336) [phone]   Fax:(336) [place], MD [place] [address]  PRINCIPAL DIAGNOSES:  1. 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 2.  metastatic non-small cell lung cancer initially diagnosed as Stage IA non-small-cell lung cancer diagnosed in June 2011.  PRIOR THERAPY:  1. Status post total laryngectomy with. Bilateral selective lymph node dissection as well as left superior parotidectomy with nerve dissection under the care of Dr. Redmond Baseman with positive resection margin. 2. Status post course of concurrent chemoradiation with weekly cisplatin. Last dose of chemotherapy was given January 08, 2007. 3. Status post curative radiotherapy with tomotherapy to the lung lesions completed December 31, 2009 under the care of Dr. Pablo Ledger.  CURRENT THERAPY: Systemic chemotherapy with carboplatin for an AUC of 5 and paclitaxel 175 mg meter squared. Status post 4 cycles  INTERVAL HISTORY: [name] 74 y.o. male returns to the clinic today for follow-up visit accompanied by his wife. The patient is feeling fine today was no specific complaints. He is currently undergoing systemic chemotherapy with carboplatin and paclitaxel is status post 4 cycles and tolerating his treatment fairly well with no significant adverse effects except for the aching pain and fatigue after the Neulasta injection. He denied having any significant nausea or vomiting, no fever or chills. He denied having any significant chest pain, shortness of breath, cough or hemoptysis. He had repeat CT scan of the chest, abdomen and pelvis performed recently and he is here for evaluation and discussion of his scan results.   MEDICAL HISTORY: Past Medical History  Diagnosis Date  . Hypertension   . 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 Cancer 2009    SUPERGLOTTIS INVASIVE SQ CELL CA     ALLERGIES:  has No Known Allergies.  MEDICATIONS:  Current Outpatient Prescriptions  Medication Sig Dispense Refill  . albuterol (PROVENTIL) (2.5 MG/3ML) 0.083% nebulizer solution Take 2.5 mg by nebulization every 6 (six) hours as needed for wheezing or shortness of breath.    Marland Kitchen amLODipine (NORVASC) 5 MG tablet Take 1 tablet (5 mg total) by mouth at bedtime. 90 tablet 3  . aspirin 81 MG tablet Take 81 mg by mouth every morning.     . baclofen (LIORESAL) 10 MG tablet Take 1 tablet (10 mg total) by mouth at bedtime. 90 each 3  . cholecalciferol (VITAMIN D) 1000 UNITS tablet Take 1,000 Units by mouth every morning.     . finasteride (PROSCAR) 5 MG tablet Take 1 tablet (5 mg total) by mouth every morning. 90 tablet 3  . metoprolol succinate (TOPROL-XL) 25 MG 24 hr tablet Take 1 tablet (25 mg total) by mouth every morning. 90 tablet 3  . prochlorperazine (COMPAZINE) 10 MG tablet Take 1 tablet (10 mg total) by mouth every 6 (six) hours as needed for nausea or vomiting. 30 tablet 0  . simvastatin (ZOCOR) 20 MG tablet Take 1 tablet (20 mg total) by mouth every evening. 30 tablet 1  . tiZANidine (ZANAFLEX) 4 MG tablet Take 1 tablet (4 mg total) by mouth 3 (three) times daily as needed for muscle spasms. 90 tablet 1   No current facility-administered medications for this visit.    SURGICAL HISTORY:  Past Surgical History  Procedure Laterality Date  . Tracheostomy  2009    REVIEW OF SYSTEMS:  Constitutional: negative Eyes: negative Ears, nose, mouth,  throat, and face: negative Respiratory: negative Cardiovascular: negative Gastrointestinal: negative Genitourinary:negative Integument/breast: negative Hematologic/lymphatic: negative Musculoskeletal:negative Neurological: negative Behavioral/Psych: negative Endocrine: negative Allergic/Immunologic: negative   PHYSICAL EXAMINATION: General appearance: alert, cooperative, fatigued and no distress Head: Normocephalic, without obvious  abnormality, atraumatic Neck: no adenopathy, no JVD, supple, symmetrical, trachea midline and thyroid not enlarged, symmetric, no tenderness/mass/nodules Lymph nodes: Cervical, supraclavicular, and axillary nodes normal. Resp: clear to auscultation bilaterally Back: symmetric, no curvature. ROM normal. No CVA tenderness. Cardio: regular rate and rhythm, S1, S2 normal, no murmur, click, rub or gallop GI: soft, non-tender; bowel sounds normal; no masses,  no organomegaly Extremities: extremities normal, atraumatic, no cyanosis or edema Neurologic: Alert and oriented X 3, normal strength and tone. Normal symmetric reflexes. Normal coordination and gait  ECOG PERFORMANCE STATUS: 1 - Symptomatic but completely ambulatory  Blood pressure 144/82, pulse 100, temperature 98.3 F (36.8 C), temperature source Oral, resp. rate 18, height '5\' 6"'$  (1.676 m), weight 153 lb 1.6 oz (69.446 kg), SpO2 96 %.  LABORATORY DATA: Lab Results  Component Value Date   WBC 8.8 03/17/2015   HGB 13.0 03/17/2015   HCT 39.8 03/17/2015   MCV 85.2 03/17/2015   PLT 183 03/17/2015      Chemistry      Component Value Date/Time   NA 142 03/10/2015 1011   NA 139 01/19/2012 0942   NA 141 03/31/2010 1005   K 3.9 03/10/2015 1011   K 4.1 01/19/2012 0942   K 3.7 03/31/2010 1005   CL 103 08/02/2012 1107   CL 96* 01/19/2012 0942   CL 105 03/31/2010 1005   CO2 23 03/10/2015 1011   CO2 28 01/19/2012 0942   CO2 31 03/31/2010 1005   BUN 8.4 03/10/2015 1011   BUN 14 01/19/2012 0942   BUN 12 03/31/2010 1005   CREATININE 0.8 03/10/2015 1011   CREATININE 1.2 01/19/2012 0942   CREATININE 1.02 03/31/2010 1005      Component Value Date/Time   CALCIUM 9.0 03/10/2015 1011   CALCIUM 9.5 01/19/2012 0942   CALCIUM 9.0 03/31/2010 1005   ALKPHOS 139 03/10/2015 1011   ALKPHOS 64 01/19/2012 0942   ALKPHOS 60 03/31/2010 1005   AST 20 03/10/2015 1011   AST 27 01/19/2012 0942   AST 21 03/31/2010 1005   ALT 11 03/10/2015 1011    ALT 23 01/19/2012 0942   ALT 19 03/31/2010 1005   BILITOT 0.32 03/10/2015 1011   BILITOT 0.50 01/19/2012 0942   BILITOT 0.4 03/31/2010 1005       RADIOGRAPHIC STUDIES: Ct Chest W Contrast  02/23/2015   CLINICAL DATA:  Lung cancer diagnosed 2011, laryngeal cancer diagnosed 2008, known liver mets in 2014.  EXAM: CT CHEST, ABDOMEN, AND PELVIS WITH CONTRAST  TECHNIQUE: Multidetector CT imaging of the chest, abdomen and pelvis was performed following the standard protocol during bolus administration of intravenous contrast.  CONTRAST:  148m OMNIPAQUE IOHEXOL 300 MG/ML  SOLN  COMPARISON:  09/29/2014  FINDINGS: CT CHEST FINDINGS  Mediastinum/Nodes: Heart is normal in size. No pericardial effusion.  Coronary atherosclerosis.  Atherosclerotic calcifications of the aortic arch.  No suspicious mediastinal, hilar, or axillary lymphadenopathy.  Lungs/Pleura: Tracheostomy in satisfactory position.  Increased platelike opacity in the right upper lobe along the right minor fissure (series 4/image 24; coronal image 42) which has progressed from the prior CTs. While this appearance was previously favored to reflect post treatment changes, increasing soft tissue in this region raises the possibility of tumor recurrence.  5 mm nodule in  the posterior left upper lobe (series 4/image 15), grossly unchanged.  No focal consolidation. Mosaic attenuation. No pleural effusion or pneumothorax.  Musculoskeletal: Degenerative changes of the thoracic spine.  CT ABDOMEN PELVIS FINDINGS  Hepatobiliary: Numerous hepatic metastases, grossly unchanged. Index lesions are as follows:  --2.5 x 3.2 cm lesion in segment 7 (series 2/ image 46), previously 2.6 x 3.3 cm, grossly unchanged  --3.2 x 3.7 cm lesion in segment 6 (series 2/ image 56), previously 3.7 x 3.4 cm, grossly unchanged  --3.9 x 2.9 cm lesion anteriorly in segment 6 (series 2/ image 59), previously 3.9 x 2.9 cm, unchanged  Gallbladder is unremarkable. No intrahepatic or  extrahepatic ductal dilatation.  Pancreas: 3.5 x 3.3 cm hypoenhancing mass in the pancreatic tail (series 2/ image 53), previously 3.6 x 2.6 cm, suspicious for metastasis, less likely pancreatic neoplasm.  Spleen: Within normal limits.  Adrenals/Urinary Tract: Adrenal glands are within normal limits.  Right kidney is within normal limits. 2.1 cm left lower pole renal cyst (series 5/image 19). No hydronephrosis.  Bladder is mildly thick-walled.  Stomach/Bowel: Stomach is within normal limits.  No evidence of bowel obstruction.  Normal appendix.  Colonic diverticulosis, without evidence of diverticulitis.  Vascular/Lymphatic: 4.1 x 4.2 cm infrarenal abdominal aortic aneurysm (series 2/image 83), previously 3.9 x 4.3 cm, grossly unchanged.  Atherosclerotic calcifications of the abdominal aorta and branch vessels.  No suspicious abdominopelvic lymphadenopathy.  Reproductive: Prostate is notable for enlargement of the central gland which indents the base of the bladder.  Other: No abdominopelvic ascites.  Musculoskeletal: Degenerative changes of the lumbar spine.  IMPRESSION: Increasing platelike opacity in the right upper lobe along the right minor fissure, in the region of prior posttreatment changes, raising the possibility of recurrent tumor. Attention on follow-up is suggested.  Stable hepatic metastases, with index lesions as above.  4.1 x 4.2 cm infrarenal abdominal aortic aneurysm, grossly unchanged.  Additional ancillary findings as above.   Electronically Signed   By: Julian Hy M.D.   On: 02/23/2015 16:07   Ct Abdomen Pelvis W Contrast  02/23/2015   CLINICAL DATA:  Lung cancer diagnosed 2011, laryngeal cancer diagnosed 2008, known liver mets in 2014.  EXAM: CT CHEST, ABDOMEN, AND PELVIS WITH CONTRAST  TECHNIQUE: Multidetector CT imaging of the chest, abdomen and pelvis was performed following the standard protocol during bolus administration of intravenous contrast.  CONTRAST:  111m OMNIPAQUE  IOHEXOL 300 MG/ML  SOLN  COMPARISON:  09/29/2014  FINDINGS: CT CHEST FINDINGS  Mediastinum/Nodes: Heart is normal in size. No pericardial effusion.  Coronary atherosclerosis.  Atherosclerotic calcifications of the aortic arch.  No suspicious mediastinal, hilar, or axillary lymphadenopathy.  Lungs/Pleura: Tracheostomy in satisfactory position.  Increased platelike opacity in the right upper lobe along the right minor fissure (series 4/image 24; coronal image 42) which has progressed from the prior CTs. While this appearance was previously favored to reflect post treatment changes, increasing soft tissue in this region raises the possibility of tumor recurrence.  5 mm nodule in the posterior left upper lobe (series 4/image 15), grossly unchanged.  No focal consolidation. Mosaic attenuation. No pleural effusion or pneumothorax.  Musculoskeletal: Degenerative changes of the thoracic spine.  CT ABDOMEN PELVIS FINDINGS  Hepatobiliary: Numerous hepatic metastases, grossly unchanged. Index lesions are as follows:  --2.5 x 3.2 cm lesion in segment 7 (series 2/ image 46), previously 2.6 x 3.3 cm, grossly unchanged  --3.2 x 3.7 cm lesion in segment 6 (series 2/ image 56), previously 3.7 x 3.4 cm, grossly  unchanged  --3.9 x 2.9 cm lesion anteriorly in segment 6 (series 2/ image 59), previously 3.9 x 2.9 cm, unchanged  Gallbladder is unremarkable. No intrahepatic or extrahepatic ductal dilatation.  Pancreas: 3.5 x 3.3 cm hypoenhancing mass in the pancreatic tail (series 2/ image 53), previously 3.6 x 2.6 cm, suspicious for metastasis, less likely pancreatic neoplasm.  Spleen: Within normal limits.  Adrenals/Urinary Tract: Adrenal glands are within normal limits.  Right kidney is within normal limits. 2.1 cm left lower pole renal cyst (series 5/image 19). No hydronephrosis.  Bladder is mildly thick-walled.  Stomach/Bowel: Stomach is within normal limits.  No evidence of bowel obstruction.  Normal appendix.  Colonic  diverticulosis, without evidence of diverticulitis.  Vascular/Lymphatic: 4.1 x 4.2 cm infrarenal abdominal aortic aneurysm (series 2/image 83), previously 3.9 x 4.3 cm, grossly unchanged.  Atherosclerotic calcifications of the abdominal aorta and branch vessels.  No suspicious abdominopelvic lymphadenopathy.  Reproductive: Prostate is notable for enlargement of the central gland which indents the base of the bladder.  Other: No abdominopelvic ascites.  Musculoskeletal: Degenerative changes of the lumbar spine.  IMPRESSION: Increasing platelike opacity in the right upper lobe along the right minor fissure, in the region of prior posttreatment changes, raising the possibility of recurrent tumor. Attention on follow-up is suggested.  Stable hepatic metastases, with index lesions as above.  4.1 x 4.2 cm infrarenal abdominal aortic aneurysm, grossly unchanged.  Additional ancillary findings as above.   Electronically Signed   By: Julian Hy M.D.   On: 02/23/2015 16:07    ASSESSMENT AND PLAN:  This is a very pleasant 74 years old African-American male with metastatic non-small cell lung cancer , squamous cell carcinoma currently undergoing systemic chemotherapy with carboplatin and paclitaxel status post 4 cycles.  The patient is tolerating his treatment well except for fatigue and aching pain after the Neulasta injection. The recent CT scan of the chest, abdomen and pelvis showed stable disease in the liver and pancreas with increasing platelike opacity in the right upper lobe in the area of the previous treatment. I discussed the scan results with the patient and his wife. I recommended for him to continue his current systemic chemotherapy with carboplatin and paclitaxel as a scheduled. He will receive cycle #5 today. The patient would come back for follow-up visit in 3 weeks for reevaluation with the start of cycle #6. CODE STATUS was discussed with the patient and he has a living will indicating full  code initially but no prolonged resuscitation or ventilation. He will bring a copy of his living will.  He was advised to call immediately if he has any concerning symptoms in the interval. The patient voices understanding of current disease status and treatment options and is in agreement with the current care plan.  All questions were answered. The patient knows to call the clinic with any problems, questions or concerns. We can certainly see the patient much sooner if necessary.  Disclaimer: This note was dictated with voice recognition software. Similar sounding words can inadvertently be transcribed and may not be corrected upon review.

## 2015-03-17 NOTE — Patient Instructions (Signed)
[facility] Discharge Instructions for Patients Receiving Chemotherapy  Today you received the following chemotherapy agents Taxol and Carboplatin.  To help prevent nausea and vomiting after your treatment, we encourage you to take your nausea medication as prescribed.   If you develop nausea and vomiting that is not controlled by your nausea medication, call the clinic.   BELOW ARE SYMPTOMS THAT SHOULD BE REPORTED IMMEDIATELY:  *FEVER GREATER THAN 100.5 F  *CHILLS WITH OR WITHOUT FEVER  NAUSEA AND VOMITING THAT IS NOT CONTROLLED WITH YOUR NAUSEA MEDICATION  *UNUSUAL SHORTNESS OF BREATH  *UNUSUAL BRUISING OR BLEEDING  TENDERNESS IN MOUTH AND THROAT WITH OR WITHOUT PRESENCE OF ULCERS  *URINARY PROBLEMS  *BOWEL PROBLEMS  UNUSUAL RASH Items with * indicate a potential emergency and should be followed up as soon as possible.  Feel free to call the clinic you have any questions or concerns. The clinic phone number is [phone].  Please show the CHEMO ALERT CARD at check-in to the Emergency Department and triage nurse.   

## 2015-03-17 NOTE — Telephone Encounter (Signed)
Gave and printed appt sched and avs fo rpt for OCT °

## 2015-03-19 ENCOUNTER — Ambulatory Visit (HOSPITAL_BASED_OUTPATIENT_CLINIC_OR_DEPARTMENT_OTHER): Payer: Medicare Other

## 2015-03-19 VITALS — BP 116/78 | HR 100 | Temp 99.0°F | Resp 20

## 2015-03-19 DIAGNOSIS — C3411 Malignant neoplasm of upper lobe, right bronchus or lung: Secondary | ICD-10-CM

## 2015-03-19 DIAGNOSIS — Z5189 Encounter for other specified aftercare: Secondary | ICD-10-CM

## 2015-03-19 MED ORDER — PEGFILGRASTIM INJECTION 6 MG/0.6ML ~~LOC~~
6.0000 mg | PREFILLED_SYRINGE | Freq: Once | SUBCUTANEOUS | Status: AC
  Administered 2015-03-19: 6 mg via SUBCUTANEOUS
  Filled 2015-03-19: qty 0.6

## 2015-03-19 NOTE — Patient Instructions (Signed)
Pegfilgrastim injection What is this medicine? PEGFILGRASTIM (peg fil GRA stim) is a long-acting granulocyte colony-stimulating factor that stimulates the growth of neutrophils, a type of white blood cell important in the body's fight against infection. It is used to reduce the incidence of fever and infection in patients with certain types of cancer who are receiving chemotherapy that affects the bone marrow. This medicine may be used for other purposes; ask your health care provider or pharmacist if you have questions. COMMON BRAND NAME(S): Neulasta What should I tell my health care provider before I take this medicine? They need to know if you have any of these conditions: -latex allergy -ongoing radiation therapy -sickle cell disease -skin reactions to acrylic adhesives (On-Body Injector only) -an unusual or allergic reaction to pegfilgrastim, filgrastim, other medicines, foods, dyes, or preservatives -pregnant or trying to get pregnant -breast-feeding How should I use this medicine? This medicine is for injection under the skin. If you get this medicine at home, you will be taught how to prepare and give the pre-filled syringe or how to use the On-body Injector. Refer to the patient Instructions for Use for detailed instructions. Use exactly as directed. Take your medicine at regular intervals. Do not take your medicine more often than directed. It is important that you put your used needles and syringes in a special sharps container. Do not put them in a trash can. If you do not have a sharps container, call your pharmacist or healthcare provider to get one. Talk to your pediatrician regarding the use of this medicine in children. Special care may be needed. Overdosage: If you think you have taken too much of this medicine contact a poison control center or emergency room at once. NOTE: This medicine is only for you. Do not share this medicine with others. What if I miss a dose? It is  important not to miss your dose. Call your doctor or health care professional if you miss your dose. If you miss a dose due to an On-body Injector failure or leakage, a new dose should be administered as soon as possible using a single prefilled syringe for manual use. What may interact with this medicine? Interactions have not been studied. Give your health care provider a list of all the medicines, herbs, non-prescription drugs, or dietary supplements you use. Also tell them if you smoke, drink alcohol, or use illegal drugs. Some items may interact with your medicine. This list may not describe all possible interactions. Give your health care provider a list of all the medicines, herbs, non-prescription drugs, or dietary supplements you use. Also tell them if you smoke, drink alcohol, or use illegal drugs. Some items may interact with your medicine. What should I watch for while using this medicine? You may need blood work done while you are taking this medicine. If you are going to need a MRI, CT scan, or other procedure, tell your doctor that you are using this medicine (On-Body Injector only). What side effects may I notice from receiving this medicine? Side effects that you should report to your doctor or health care professional as soon as possible: -allergic reactions like skin rash, itching or hives, swelling of the face, lips, or tongue -dizziness -fever -pain, redness, or irritation at site where injected -pinpoint red spots on the skin -shortness of breath or breathing problems -stomach or side pain, or pain at the shoulder -swelling -tiredness -trouble passing urine Side effects that usually do not require medical attention (report to your doctor   or health care professional if they continue or are bothersome): -bone pain -muscle pain This list may not describe all possible side effects. Call your doctor for medical advice about side effects. You may report side effects to FDA at  1-800-FDA-1088. Where should I keep my medicine? Keep out of the reach of children. Store pre-filled syringes in a refrigerator between 2 and 8 degrees C (36 and 46 degrees F). Do not freeze. Keep in carton to protect from light. Throw away this medicine if it is left out of the refrigerator for more than 48 hours. Throw away any unused medicine after the expiration date. NOTE: This sheet is a summary. It may not cover all possible information. If you have questions about this medicine, talk to your doctor, pharmacist, or health care provider.  2015, [employer]vier/[employer]. (2013-08-28 16:14:05)  

## 2015-03-22 ENCOUNTER — Ambulatory Visit: Payer: Medicare Other

## 2015-03-24 ENCOUNTER — Other Ambulatory Visit: Payer: Self-pay | Admitting: *Deleted

## 2015-03-24 ENCOUNTER — Ambulatory Visit (INDEPENDENT_AMBULATORY_CARE_PROVIDER_SITE_OTHER): Payer: Medicare Other | Admitting: Internal Medicine

## 2015-03-24 ENCOUNTER — Encounter: Payer: Self-pay | Admitting: Internal Medicine

## 2015-03-24 ENCOUNTER — Other Ambulatory Visit (HOSPITAL_BASED_OUTPATIENT_CLINIC_OR_DEPARTMENT_OTHER): Payer: Medicare Other

## 2015-03-24 VITALS — BP 150/90 | HR 100 | Temp 98.5°F | Resp 20 | Ht 66.0 in | Wt 161.0 lb

## 2015-03-24 DIAGNOSIS — R319 Hematuria, unspecified: Secondary | ICD-10-CM

## 2015-03-24 DIAGNOSIS — C329 Malignant neoplasm of larynx, unspecified: Secondary | ICD-10-CM | POA: Diagnosis not present

## 2015-03-24 DIAGNOSIS — K769 Liver disease, unspecified: Secondary | ICD-10-CM

## 2015-03-24 DIAGNOSIS — C3491 Malignant neoplasm of unspecified part of right bronchus or lung: Secondary | ICD-10-CM

## 2015-03-24 DIAGNOSIS — I10 Essential (primary) hypertension: Secondary | ICD-10-CM | POA: Diagnosis not present

## 2015-03-24 DIAGNOSIS — K7689 Other specified diseases of liver: Secondary | ICD-10-CM

## 2015-03-24 LAB — CBC WITH DIFFERENTIAL/PLATELET
BASO%: 0.7 % (ref 0.0–2.0)
BASOS ABS: 0 10*3/uL (ref 0.0–0.1)
EOS ABS: 0 10*3/uL (ref 0.0–0.5)
EOS%: 1 % (ref 0.0–7.0)
HCT: 38.7 % (ref 38.4–49.9)
HEMOGLOBIN: 12.9 g/dL — AB (ref 13.0–17.1)
LYMPH%: 15.7 % (ref 14.0–49.0)
MCH: 28.2 pg (ref 27.2–33.4)
MCHC: 33.3 g/dL (ref 32.0–36.0)
MCV: 84.7 fL (ref 79.3–98.0)
MONO#: 0.6 10*3/uL (ref 0.1–0.9)
MONO%: 13.3 % (ref 0.0–14.0)
NEUT%: 69.3 % (ref 39.0–75.0)
NEUTROS ABS: 2.9 10*3/uL (ref 1.5–6.5)
PLATELETS: 113 10*3/uL — AB (ref 140–400)
RBC: 4.57 10*6/uL (ref 4.20–5.82)
RDW: 18.9 % — AB (ref 11.0–14.6)
WBC: 4.2 10*3/uL (ref 4.0–10.3)
lymph#: 0.7 10*3/uL — ABNORMAL LOW (ref 0.9–3.3)

## 2015-03-24 LAB — POCT URINALYSIS DIPSTICK
Glucose, UA: NEGATIVE
Ketones, UA: NEGATIVE
LEUKOCYTES UA: NEGATIVE
NITRITE UA: NEGATIVE
PH UA: 5
Spec Grav, UA: 1.025
UROBILINOGEN UA: 0.2

## 2015-03-24 LAB — COMPREHENSIVE METABOLIC PANEL (CC13)
ALBUMIN: 3.7 g/dL (ref 3.5–5.0)
ALK PHOS: 125 U/L (ref 40–150)
ALT: 16 U/L (ref 0–55)
ANION GAP: 11 meq/L (ref 3–11)
AST: 24 U/L (ref 5–34)
BILIRUBIN TOTAL: 0.46 mg/dL (ref 0.20–1.20)
BUN: 17.4 mg/dL (ref 7.0–26.0)
CALCIUM: 9.4 mg/dL (ref 8.4–10.4)
CO2: 24 mEq/L (ref 22–29)
Chloride: 104 mEq/L (ref 98–109)
Creatinine: 0.8 mg/dL (ref 0.7–1.3)
GLUCOSE: 112 mg/dL (ref 70–140)
POTASSIUM: 3.6 meq/L (ref 3.5–5.1)
Sodium: 139 mEq/L (ref 136–145)
TOTAL PROTEIN: 7.2 g/dL (ref 6.4–8.3)

## 2015-03-24 NOTE — Progress Notes (Signed)
Pre visit review using our clinic review tool, if applicable. No additional management support is needed unless otherwise documented below in the visit note. 

## 2015-03-24 NOTE — Progress Notes (Signed)
Subjective:    Patient ID: [name], male    DOB: [birth year], 74 y.o.   MRN: [id number]  HPI  Casey Koch, MD [place] [address]  PRINCIPAL DIAGNOSES:  1. 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 2. metastatic non-small cell lung cancer initially diagnosed as Stage IA non-small-cell lung cancer diagnosed in June 2011.  PRIOR THERAPY:  1. Status post total laryngectomy with. Bilateral selective lymph node dissection as well as left superior parotidectomy with nerve dissection under the care of Dr. Redmond Baseman with positive resection margin. 2. Status post course of concurrent chemoradiation with weekly cisplatin. Last dose of chemotherapy was given January 08, 2007. 3. Status post curative radiotherapy with tomotherapy to the lung lesions completed December 31, 2009 under the care of Dr. Pablo Ledger.  CURRENT THERAPY: Systemic chemotherapy with carboplatin for an AUC of 5 and paclitaxel 175 mg meter squared. Status post 4 cycles  It is having 74 year old patient who has a comp located oncologic history.  He is seen today as a work in after a episode of gross hematuria earlier this morning.  This was associated with some mild dysuria.  He has been treated for UTIs in the remote past.  No history of renal stones.  He has had a recent imaging studies that included CT scan of the abdominal and pelvic area. He does have known metastatic liver disease  Past Medical History  Diagnosis Date  . Hypertension   . Stroke [facility]) 2002    left side weakness  . Cancer ([place]) 2009    SUPERGLOTTIS INVASIVE SQ CELL CA  . Full code status 03/17/2015    Social History   Social History  . Marital Status: Married    Spouse Name: N/A  . Number of Children: N/A  . Years of Education: N/A   Occupational History  . Not on file.   Social History Main Topics  . Smoking status: Former Smoker    Quit date: 11/05/2003  . Smokeless  tobacco: Not on file  . Alcohol Use: Yes     Comment: wine  . Drug Use: No  . Sexual Activity: Not on file   Other Topics Concern  . Not on file   Social History Narrative    Past Surgical History  Procedure Laterality Date  . Tracheostomy  2009    Family History  Problem Relation Age of Onset  . Cancer Mother     lung    No Known Allergies  Current Outpatient Prescriptions on File Prior to Visit  Medication Sig Dispense Refill  . albuterol (PROVENTIL) (2.5 MG/3ML) 0.083% nebulizer solution Take 2.5 mg by nebulization every 6 (six) hours as needed for wheezing or shortness of breath.    Marland Kitchen amLODipine (NORVASC) 5 MG tablet Take 1 tablet (5 mg total) by mouth at bedtime. 90 tablet 3  . aspirin 81 MG tablet Take 81 mg by mouth every morning.     . baclofen (LIORESAL) 10 MG tablet Take 1 tablet (10 mg total) by mouth at bedtime. 90 each 3  . cholecalciferol (VITAMIN D) 1000 UNITS tablet Take 1,000 Units by mouth every morning.     . finasteride (PROSCAR) 5 MG tablet Take 1 tablet (5 mg total) by mouth every morning. 90 tablet 3  . metoprolol succinate (TOPROL-XL) 25 MG 24 hr tablet Take 1 tablet (25 mg total) by mouth every morning. 90 tablet 3  . prochlorperazine (COMPAZINE)  10 MG tablet Take 1 tablet (10 mg total) by mouth every 6 (six) hours as needed for nausea or vomiting. 30 tablet 0  . simvastatin (ZOCOR) 20 MG tablet Take 1 tablet (20 mg total) by mouth every evening. 30 tablet 1  . tiZANidine (ZANAFLEX) 4 MG tablet Take 1 tablet (4 mg total) by mouth 3 (three) times daily as needed for muscle spasms. 90 tablet 1   No current facility-administered medications on file prior to visit.    BP 150/90 mmHg  Pulse 100  Temp(Src) 98.5 F (36.9 C) (Oral)  Resp 20  Ht '5\' 6"'$  (1.676 m)  Wt 161 lb (73.029 kg)  BMI 26.00 kg/m2  SpO2 98%        Review of Systems  Constitutional: Negative for fever, chills, appetite change and fatigue.  HENT: Negative for congestion,  dental problem, ear pain, hearing loss, sore throat, tinnitus, trouble swallowing and voice change.   Eyes: Negative for pain, discharge and visual disturbance.  Respiratory: Negative for cough, chest tightness, wheezing and stridor.   Cardiovascular: Negative for chest pain, palpitations and leg swelling.  Gastrointestinal: Negative for nausea, vomiting, abdominal pain, diarrhea, constipation, blood in stool and abdominal distention.  Genitourinary: Positive for dysuria and hematuria. Negative for urgency, flank pain, discharge, difficulty urinating and genital sores.  Musculoskeletal: Positive for gait problem. Negative for myalgias, back pain, joint swelling, arthralgias and neck stiffness.  Skin: Negative for rash.  Neurological: Negative for dizziness, syncope, speech difficulty, weakness, numbness and headaches.  Hematological: Negative for adenopathy. Does not bruise/bleed easily.  Psychiatric/Behavioral: Negative for behavioral problems and dysphoric mood. The patient is not nervous/anxious.        Objective:   Physical Exam  Constitutional: He appears well-developed and well-nourished.  Repeat blood pressure 140/90  Neck:  Status post tracheostomy  Musculoskeletal:  Wheelchair bound          Assessment & Plan:   Gross hematuria.  No pyuria.  Options were discussed with the patient.  He wishes to have urological evaluation. Probable metastatic disease. To Bladder , although cannot exclude another primary.  Will refer to urology for consideration of cystoscopy

## 2015-03-24 NOTE — Patient Instructions (Signed)
Urological evaluation as discussed  Follow-up oncology

## 2015-03-30 ENCOUNTER — Other Ambulatory Visit: Payer: Self-pay | Admitting: Medical Oncology

## 2015-03-30 DIAGNOSIS — C3491 Malignant neoplasm of unspecified part of right bronchus or lung: Secondary | ICD-10-CM

## 2015-03-31 ENCOUNTER — Other Ambulatory Visit (HOSPITAL_BASED_OUTPATIENT_CLINIC_OR_DEPARTMENT_OTHER): Payer: Medicare Other

## 2015-03-31 DIAGNOSIS — C3491 Malignant neoplasm of unspecified part of right bronchus or lung: Secondary | ICD-10-CM

## 2015-03-31 LAB — CBC WITH DIFFERENTIAL/PLATELET
BASO%: 0.3 % (ref 0.0–2.0)
BASOS ABS: 0 10*3/uL (ref 0.0–0.1)
EOS%: 0.1 % (ref 0.0–7.0)
Eosinophils Absolute: 0 10*3/uL (ref 0.0–0.5)
HEMATOCRIT: 39.3 % (ref 38.4–49.9)
HEMOGLOBIN: 12.8 g/dL — AB (ref 13.0–17.1)
LYMPH#: 1.1 10*3/uL (ref 0.9–3.3)
LYMPH%: 8.7 % — ABNORMAL LOW (ref 14.0–49.0)
MCH: 28.2 pg (ref 27.2–33.4)
MCHC: 32.6 g/dL (ref 32.0–36.0)
MCV: 86.6 fL (ref 79.3–98.0)
MONO#: 0.8 10*3/uL (ref 0.1–0.9)
MONO%: 6.3 % (ref 0.0–14.0)
NEUT#: 10.6 10*3/uL — ABNORMAL HIGH (ref 1.5–6.5)
NEUT%: 84.6 % — AB (ref 39.0–75.0)
NRBC: 0 % (ref 0–0)
Platelets: 183 10*3/uL (ref 140–400)
RBC: 4.54 10*6/uL (ref 4.20–5.82)
RDW: 19.1 % — AB (ref 11.0–14.6)
WBC: 12.5 10*3/uL — ABNORMAL HIGH (ref 4.0–10.3)

## 2015-03-31 LAB — COMPREHENSIVE METABOLIC PANEL (CC13)
ALBUMIN: 3.5 g/dL (ref 3.5–5.0)
ALK PHOS: 143 U/L (ref 40–150)
ALT: 14 U/L (ref 0–55)
AST: 21 U/L (ref 5–34)
Anion Gap: 8 mEq/L (ref 3–11)
BILIRUBIN TOTAL: 0.31 mg/dL (ref 0.20–1.20)
BUN: 9.5 mg/dL (ref 7.0–26.0)
CALCIUM: 9.3 mg/dL (ref 8.4–10.4)
CO2: 24 mEq/L (ref 22–29)
CREATININE: 0.8 mg/dL (ref 0.7–1.3)
Chloride: 107 mEq/L (ref 98–109)
EGFR: >90 mL/min/{1.73_m2} (ref >90)
GLUCOSE: 100 mg/dL (ref 70–140)
POTASSIUM: 4 meq/L (ref 3.5–5.1)
SODIUM: 140 meq/L (ref 136–145)
TOTAL PROTEIN: 6.9 g/dL (ref 6.4–8.3)

## 2015-04-06 ENCOUNTER — Other Ambulatory Visit: Payer: Medicare Other

## 2015-04-06 ENCOUNTER — Ambulatory Visit (HOSPITAL_COMMUNITY): Payer: Medicare Other

## 2015-04-07 ENCOUNTER — Other Ambulatory Visit: Payer: Self-pay | Admitting: *Deleted

## 2015-04-07 ENCOUNTER — Encounter: Payer: Self-pay | Admitting: Internal Medicine

## 2015-04-07 ENCOUNTER — Ambulatory Visit (HOSPITAL_BASED_OUTPATIENT_CLINIC_OR_DEPARTMENT_OTHER): Payer: Medicare Other | Admitting: Internal Medicine

## 2015-04-07 ENCOUNTER — Ambulatory Visit (HOSPITAL_BASED_OUTPATIENT_CLINIC_OR_DEPARTMENT_OTHER): Payer: Medicare Other

## 2015-04-07 ENCOUNTER — Other Ambulatory Visit (HOSPITAL_BASED_OUTPATIENT_CLINIC_OR_DEPARTMENT_OTHER): Payer: Medicare Other

## 2015-04-07 VITALS — BP 140/93 | HR 94 | Temp 99.0°F | Resp 18 | Ht 66.0 in | Wt 158.4 lb

## 2015-04-07 DIAGNOSIS — C329 Malignant neoplasm of larynx, unspecified: Secondary | ICD-10-CM

## 2015-04-07 DIAGNOSIS — C3491 Malignant neoplasm of unspecified part of right bronchus or lung: Secondary | ICD-10-CM

## 2015-04-07 DIAGNOSIS — C3411 Malignant neoplasm of upper lobe, right bronchus or lung: Secondary | ICD-10-CM | POA: Diagnosis not present

## 2015-04-07 DIAGNOSIS — Z8521 Personal history of malignant neoplasm of larynx: Secondary | ICD-10-CM

## 2015-04-07 DIAGNOSIS — Z5111 Encounter for antineoplastic chemotherapy: Secondary | ICD-10-CM

## 2015-04-07 LAB — COMPREHENSIVE METABOLIC PANEL (CC13)
ALT: 17 U/L (ref 0–55)
ANION GAP: 9 meq/L (ref 3–11)
AST: 24 U/L (ref 5–34)
Albumin: 3.5 g/dL (ref 3.5–5.0)
Alkaline Phosphatase: 126 U/L (ref 40–150)
BUN: 9 mg/dL (ref 7.0–26.0)
CO2: 24 meq/L (ref 22–29)
CREATININE: 0.8 mg/dL (ref 0.7–1.3)
Calcium: 9.4 mg/dL (ref 8.4–10.4)
Chloride: 109 mEq/L (ref 98–109)
EGFR: >90 mL/min/{1.73_m2} (ref >90)
GLUCOSE: 99 mg/dL (ref 70–140)
Potassium: 3.8 mEq/L (ref 3.5–5.1)
Sodium: 141 mEq/L (ref 136–145)
TOTAL PROTEIN: 6.8 g/dL (ref 6.4–8.3)

## 2015-04-07 LAB — CBC WITH DIFFERENTIAL/PLATELET
BASO%: 0.6 % (ref 0.0–2.0)
Basophils Absolute: 0.1 10*3/uL (ref 0.0–0.1)
EOS%: 0.3 % (ref 0.0–7.0)
Eosinophils Absolute: 0 10*3/uL (ref 0.0–0.5)
HCT: 39.9 % (ref 38.4–49.9)
HGB: 13.1 g/dL (ref 13.0–17.1)
LYMPH#: 1.2 10*3/uL (ref 0.9–3.3)
LYMPH%: 12.1 % — AB (ref 14.0–49.0)
MCH: 28.4 pg (ref 27.2–33.4)
MCHC: 32.8 g/dL (ref 32.0–36.0)
MCV: 86.6 fL (ref 79.3–98.0)
MONO#: 0.6 10*3/uL (ref 0.1–0.9)
MONO%: 6.5 % (ref 0.0–14.0)
NEUT%: 80.5 % — ABNORMAL HIGH (ref 39.0–75.0)
NEUTROS ABS: 7.7 10*3/uL — AB (ref 1.5–6.5)
PLATELETS: 194 10*3/uL (ref 140–400)
RBC: 4.61 10*6/uL (ref 4.20–5.82)
RDW: 20 % — ABNORMAL HIGH (ref 11.0–14.6)
WBC: 9.6 10*3/uL (ref 4.0–10.3)

## 2015-04-07 MED ORDER — DIPHENHYDRAMINE HCL 50 MG/ML IJ SOLN
INTRAMUSCULAR | Status: AC
  Filled 2015-04-07: qty 1

## 2015-04-07 MED ORDER — SODIUM CHLORIDE 0.9 % IV SOLN
Freq: Once | INTRAVENOUS | Status: AC
  Administered 2015-04-07: 10:00:00 via INTRAVENOUS
  Filled 2015-04-07: qty 8

## 2015-04-07 MED ORDER — DIPHENHYDRAMINE HCL 50 MG/ML IJ SOLN
50.0000 mg | Freq: Once | INTRAMUSCULAR | Status: AC
  Administered 2015-04-07: 50 mg via INTRAVENOUS

## 2015-04-07 MED ORDER — FAMOTIDINE IN NACL 20-0.9 MG/50ML-% IV SOLN
INTRAVENOUS | Status: AC
  Filled 2015-04-07: qty 50

## 2015-04-07 MED ORDER — SODIUM CHLORIDE 0.9 % IV SOLN
493.0000 mg | Freq: Once | INTRAVENOUS | Status: AC
  Administered 2015-04-07: 490 mg via INTRAVENOUS
  Filled 2015-04-07: qty 49

## 2015-04-07 MED ORDER — SODIUM CHLORIDE 0.9 % IV SOLN
Freq: Once | INTRAVENOUS | Status: AC
  Administered 2015-04-07: 10:00:00 via INTRAVENOUS

## 2015-04-07 MED ORDER — PACLITAXEL CHEMO INJECTION 300 MG/50ML
175.0000 mg/m2 | Freq: Once | INTRAVENOUS | Status: AC
  Administered 2015-04-07: 336 mg via INTRAVENOUS
  Filled 2015-04-07: qty 56

## 2015-04-07 MED ORDER — FAMOTIDINE IN NACL 20-0.9 MG/50ML-% IV SOLN
20.0000 mg | Freq: Once | INTRAVENOUS | Status: AC
  Administered 2015-04-07: 20 mg via INTRAVENOUS

## 2015-04-07 NOTE — Patient Instructions (Signed)
[facility] Discharge Instructions for Patients Receiving Chemotherapy  Today you received the following chemotherapy agents Taxol/Carboplatin.  To help prevent nausea and vomiting after your treatment, we encourage you to take your nausea medication as directed.   If you develop nausea and vomiting that is not controlled by your nausea medication, call the clinic.   BELOW ARE SYMPTOMS THAT SHOULD BE REPORTED IMMEDIATELY:  *FEVER GREATER THAN 100.5 F  *CHILLS WITH OR WITHOUT FEVER  NAUSEA AND VOMITING THAT IS NOT CONTROLLED WITH YOUR NAUSEA MEDICATION  *UNUSUAL SHORTNESS OF BREATH  *UNUSUAL BRUISING OR BLEEDING  TENDERNESS IN MOUTH AND THROAT WITH OR WITHOUT PRESENCE OF ULCERS  *URINARY PROBLEMS  *BOWEL PROBLEMS  UNUSUAL RASH Items with * indicate a potential emergency and should be followed up as soon as possible.  Feel free to call the clinic you have any questions or concerns. The clinic phone number is [phone].  Please show the [facility] at check-in to the Emergency Department and triage nurse.

## 2015-04-07 NOTE — Progress Notes (Signed)
[facility] Telephone:([phone]   Fax:([phone]) [place], MD [place] [address]  PRINCIPAL DIAGNOSES:  1. 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 2.  metastatic non-small cell lung cancer initially diagnosed as Stage IA non-small-cell lung cancer diagnosed in June 2011.  PRIOR THERAPY:  1. Status post total laryngectomy with. Bilateral selective lymph node dissection as well as left superior parotidectomy with nerve dissection under the care of Dr. Redmond Baseman with positive resection margin. 2. Status post course of concurrent chemoradiation with weekly cisplatin. Last dose of chemotherapy was given January 08, 2007. 3. Status post curative radiotherapy with tomotherapy to the lung lesions completed December 31, 2009 under the care of Dr. Pablo Ledger.  CURRENT THERAPY: Systemic chemotherapy with carboplatin for an AUC of 5 and paclitaxel 175 mg meter squared. Status post 5 cycles  INTERVAL HISTORY: [name] 74 y.o. male returns to the clinic today for follow-up visit accompanied by his wife. The patient is feeling fine today was no specific complaints. He is tolerating his systemic chemotherapy with carboplatin and paclitaxel fairly well with no significant adverse effects except for the aching pain and fatigue after the Neulasta injection. He denied having any significant nausea or vomiting, no fever or chills. He denied having any significant chest pain, shortness of breath, cough or hemoptysis. He is here today to start cycle #6 of his chemotherapy.   MEDICAL HISTORY: Past Medical History  Diagnosis Date  . Hypertension   . Stroke [facility]) 2002    left side weakness  . Cancer ([place]) 2009    SUPERGLOTTIS INVASIVE SQ CELL CA  . Full code status 03/17/2015    ALLERGIES:  has No Known Allergies.  MEDICATIONS:  Current Outpatient Prescriptions  Medication Sig  Dispense Refill  . albuterol (PROVENTIL) (2.5 MG/3ML) 0.083% nebulizer solution Take 2.5 mg by nebulization every 6 (six) hours as needed for wheezing or shortness of breath.    Marland Kitchen amLODipine (NORVASC) 5 MG tablet Take 1 tablet (5 mg total) by mouth at bedtime. 90 tablet 3  . aspirin 81 MG tablet Take 81 mg by mouth every morning.     . baclofen (LIORESAL) 10 MG tablet Take 1 tablet (10 mg total) by mouth at bedtime. 90 each 3  . cholecalciferol (VITAMIN D) 1000 UNITS tablet Take 1,000 Units by mouth every morning.     . diphenhydrAMINE (BENADRYL) 25 MG tablet Take 50 mg by mouth every 8 (eight) hours as needed.    . finasteride (PROSCAR) 5 MG tablet Take 1 tablet (5 mg total) by mouth every morning. 90 tablet 3  . loratadine (CLARITIN) 10 MG tablet Take 10 mg by mouth daily.    . metoprolol succinate (TOPROL-XL) 25 MG 24 hr tablet Take 1 tablet (25 mg total) by mouth every morning. 90 tablet 3  . prochlorperazine (COMPAZINE) 10 MG tablet Take 1 tablet (10 mg total) by mouth every 6 (six) hours as needed for nausea or vomiting. 30 tablet 0  . simvastatin (ZOCOR) 20 MG tablet Take 1 tablet (20 mg total) by mouth every evening. 30 tablet 1  . tiZANidine (ZANAFLEX) 4 MG tablet Take 1 tablet (4 mg total) by mouth 3 (three) times daily as needed for muscle spasms. 90 tablet 1   No current facility-administered medications for this visit.    SURGICAL HISTORY:  Past Surgical History  Procedure Laterality  Date  . Tracheostomy  2009    REVIEW OF SYSTEMS:  Constitutional: negative Eyes: negative Ears, nose, mouth, throat, and face: negative Respiratory: negative Cardiovascular: negative Gastrointestinal: negative Genitourinary:negative Integument/breast: negative Hematologic/lymphatic: negative Musculoskeletal:negative Neurological: negative Behavioral/Psych: negative Endocrine: negative Allergic/Immunologic: negative   PHYSICAL EXAMINATION: General appearance: alert, cooperative, fatigued  and no distress Head: Normocephalic, without obvious abnormality, atraumatic Neck: no adenopathy, no JVD, supple, symmetrical, trachea midline and thyroid not enlarged, symmetric, no tenderness/mass/nodules Lymph nodes: Cervical, supraclavicular, and axillary nodes normal. Resp: clear to auscultation bilaterally Back: symmetric, no curvature. ROM normal. No CVA tenderness. Cardio: regular rate and rhythm, S1, S2 normal, no murmur, click, rub or gallop GI: soft, non-tender; bowel sounds normal; no masses,  no organomegaly Extremities: extremities normal, atraumatic, no cyanosis or edema Neurologic: Alert and oriented X 3, normal strength and tone. Normal symmetric reflexes. Normal coordination and gait  ECOG PERFORMANCE STATUS: 1 - Symptomatic but completely ambulatory  Blood pressure 140/93, pulse 94, temperature 99 F (37.2 C), temperature source Oral, resp. rate 18, height '5\' 6"'$  (1.676 m), weight 158 lb 6.4 oz (71.85 kg), SpO2 99 %.  LABORATORY DATA: Lab Results  Component Value Date   WBC 9.6 04/07/2015   HGB 13.1 04/07/2015   HCT 39.9 04/07/2015   MCV 86.6 04/07/2015   PLT 194 04/07/2015      Chemistry      Component Value Date/Time   NA 140 03/31/2015 1026   NA 139 01/19/2012 0942   NA 141 03/31/2010 1005   K 4.0 03/31/2015 1026   K 4.1 01/19/2012 0942   K 3.7 03/31/2010 1005   CL 103 08/02/2012 1107   CL 96* 01/19/2012 0942   CL 105 03/31/2010 1005   CO2 24 03/31/2015 1026   CO2 28 01/19/2012 0942   CO2 31 03/31/2010 1005   BUN 9.5 03/31/2015 1026   BUN 14 01/19/2012 0942   BUN 12 03/31/2010 1005   CREATININE 0.8 03/31/2015 1026   CREATININE 1.2 01/19/2012 0942   CREATININE 1.02 03/31/2010 1005      Component Value Date/Time   CALCIUM 9.3 03/31/2015 1026   CALCIUM 9.5 01/19/2012 0942   CALCIUM 9.0 03/31/2010 1005   ALKPHOS 143 03/31/2015 1026   ALKPHOS 64 01/19/2012 0942   ALKPHOS 60 03/31/2010 1005   AST 21 03/31/2015 1026   AST 27 01/19/2012 0942   AST  21 03/31/2010 1005   ALT 14 03/31/2015 1026   ALT 23 01/19/2012 0942   ALT 19 03/31/2010 1005   BILITOT 0.31 03/31/2015 1026   BILITOT 0.50 01/19/2012 0942   BILITOT 0.4 03/31/2010 1005       RADIOGRAPHIC STUDIES: No results found.  ASSESSMENT AND PLAN:  This is a very pleasant 74 years old African-American male with metastatic non-small cell lung cancer , squamous cell carcinoma currently undergoing systemic chemotherapy with carboplatin and paclitaxel status post 4 cycles.  The patient is tolerating his treatment well except for fatigue and aching pain after the Neulasta injection. I recommended for him to continue his current systemic chemotherapy with carboplatin and paclitaxel as a scheduled. He will receive cycle #6 today. The patient would come back for follow-up visit in 3 weeks for reevaluation after repeating CT scan of the chest, abdomen and pelvis for restaging of his disease.  He was advised to call immediately if he has any concerning symptoms in the interval. The patient voices understanding of current disease status and treatment options and is in agreement with the current care  plan.  All questions were answered. The patient knows to call the clinic with any problems, questions or concerns. We can certainly see the patient much sooner if necessary.  Disclaimer: This note was dictated with voice recognition software. Similar sounding words can inadvertently be transcribed and may not be corrected upon review.

## 2015-04-09 ENCOUNTER — Ambulatory Visit (HOSPITAL_BASED_OUTPATIENT_CLINIC_OR_DEPARTMENT_OTHER): Payer: Medicare Other

## 2015-04-09 VITALS — BP 93/57 | HR 101 | Temp 98.2°F

## 2015-04-09 DIAGNOSIS — Z5189 Encounter for other specified aftercare: Secondary | ICD-10-CM | POA: Diagnosis not present

## 2015-04-09 DIAGNOSIS — C3411 Malignant neoplasm of upper lobe, right bronchus or lung: Secondary | ICD-10-CM | POA: Diagnosis not present

## 2015-04-09 MED ORDER — PEGFILGRASTIM INJECTION 6 MG/0.6ML ~~LOC~~
6.0000 mg | PREFILLED_SYRINGE | Freq: Once | SUBCUTANEOUS | Status: AC
  Administered 2015-04-09: 6 mg via SUBCUTANEOUS
  Filled 2015-04-09: qty 0.6

## 2015-04-13 ENCOUNTER — Other Ambulatory Visit: Payer: Self-pay | Admitting: Medical Oncology

## 2015-04-13 ENCOUNTER — Ambulatory Visit: Payer: Medicare Other | Admitting: Internal Medicine

## 2015-04-13 DIAGNOSIS — C3491 Malignant neoplasm of unspecified part of right bronchus or lung: Secondary | ICD-10-CM

## 2015-04-14 ENCOUNTER — Other Ambulatory Visit (HOSPITAL_BASED_OUTPATIENT_CLINIC_OR_DEPARTMENT_OTHER): Payer: Medicare Other

## 2015-04-14 DIAGNOSIS — C3491 Malignant neoplasm of unspecified part of right bronchus or lung: Secondary | ICD-10-CM

## 2015-04-14 LAB — COMPREHENSIVE METABOLIC PANEL (CC13)
ALBUMIN: 3.6 g/dL (ref 3.5–5.0)
ALK PHOS: 108 U/L (ref 40–150)
ALT: 15 U/L (ref 0–55)
AST: 22 U/L (ref 5–34)
Anion Gap: 9 mEq/L (ref 3–11)
BILIRUBIN TOTAL: 0.57 mg/dL (ref 0.20–1.20)
BUN: 13.8 mg/dL (ref 7.0–26.0)
CALCIUM: 9.4 mg/dL (ref 8.4–10.4)
CO2: 25 mEq/L (ref 22–29)
Chloride: 106 mEq/L (ref 98–109)
Creatinine: 0.8 mg/dL (ref 0.7–1.3)
Glucose: 109 mg/dl (ref 70–140)
POTASSIUM: 3.5 meq/L (ref 3.5–5.1)
Sodium: 139 mEq/L (ref 136–145)
Total Protein: 6.9 g/dL (ref 6.4–8.3)

## 2015-04-14 LAB — CBC WITH DIFFERENTIAL/PLATELET
BASO%: 0.7 % (ref 0.0–2.0)
BASOS ABS: 0 10*3/uL (ref 0.0–0.1)
EOS ABS: 0 10*3/uL (ref 0.0–0.5)
EOS%: 0.8 % (ref 0.0–7.0)
HEMATOCRIT: 38.3 % — AB (ref 38.4–49.9)
HEMOGLOBIN: 12.4 g/dL — AB (ref 13.0–17.1)
LYMPH#: 0.5 10*3/uL — AB (ref 0.9–3.3)
LYMPH%: 16.2 % (ref 14.0–49.0)
MCH: 28.3 pg (ref 27.2–33.4)
MCHC: 32.4 g/dL (ref 32.0–36.0)
MCV: 87.4 fL (ref 79.3–98.0)
MONO#: 0.3 10*3/uL (ref 0.1–0.9)
MONO%: 10.9 % (ref 0.0–14.0)
NEUT#: 2.2 10*3/uL (ref 1.5–6.5)
NEUT%: 71.4 % (ref 39.0–75.0)
PLATELETS: 104 10*3/uL — AB (ref 140–400)
RBC: 4.38 10*6/uL (ref 4.20–5.82)
RDW: 17.9 % — AB (ref 11.0–14.6)
WBC: 3 10*3/uL — ABNORMAL LOW (ref 4.0–10.3)

## 2015-04-20 ENCOUNTER — Other Ambulatory Visit: Payer: Self-pay | Admitting: *Deleted

## 2015-04-20 DIAGNOSIS — C3491 Malignant neoplasm of unspecified part of right bronchus or lung: Secondary | ICD-10-CM

## 2015-04-21 ENCOUNTER — Other Ambulatory Visit (HOSPITAL_BASED_OUTPATIENT_CLINIC_OR_DEPARTMENT_OTHER): Payer: Medicare Other

## 2015-04-21 DIAGNOSIS — C3491 Malignant neoplasm of unspecified part of right bronchus or lung: Secondary | ICD-10-CM | POA: Diagnosis not present

## 2015-04-21 LAB — CBC WITH DIFFERENTIAL/PLATELET
BASO%: 0.5 % (ref 0.0–2.0)
Basophils Absolute: 0.1 10*3/uL (ref 0.0–0.1)
EOS%: 0.2 % (ref 0.0–7.0)
Eosinophils Absolute: 0 10*3/uL (ref 0.0–0.5)
HCT: 38.8 % (ref 38.4–49.9)
HGB: 12.7 g/dL — ABNORMAL LOW (ref 13.0–17.1)
LYMPH%: 10.4 % — AB (ref 14.0–49.0)
MCH: 28.9 pg (ref 27.2–33.4)
MCHC: 32.8 g/dL (ref 32.0–36.0)
MCV: 88.3 fL (ref 79.3–98.0)
MONO#: 0.5 10*3/uL (ref 0.1–0.9)
MONO%: 5.3 % (ref 0.0–14.0)
NEUT#: 8.1 10*3/uL — ABNORMAL HIGH (ref 1.5–6.5)
NEUT%: 83.6 % — AB (ref 39.0–75.0)
Platelets: 164 10*3/uL (ref 140–400)
RBC: 4.39 10*6/uL (ref 4.20–5.82)
RDW: 17.6 % — ABNORMAL HIGH (ref 11.0–14.6)
WBC: 9.7 10*3/uL (ref 4.0–10.3)
lymph#: 1 10*3/uL (ref 0.9–3.3)

## 2015-04-21 LAB — COMPREHENSIVE METABOLIC PANEL (CC13)
ALT: 12 U/L (ref 0–55)
ANION GAP: 10 meq/L (ref 3–11)
AST: 19 U/L (ref 5–34)
Albumin: 3.4 g/dL — ABNORMAL LOW (ref 3.5–5.0)
Alkaline Phosphatase: 120 U/L (ref 40–150)
BUN: 10.6 mg/dL (ref 7.0–26.0)
CO2: 24 meq/L (ref 22–29)
CREATININE: 0.7 mg/dL (ref 0.7–1.3)
Calcium: 9 mg/dL (ref 8.4–10.4)
Chloride: 107 mEq/L (ref 98–109)
EGFR: >90 mL/min/{1.73_m2} (ref >90)
Glucose: 97 mg/dl (ref 70–140)
POTASSIUM: 3.8 meq/L (ref 3.5–5.1)
Sodium: 141 mEq/L (ref 136–145)
Total Bilirubin: 0.41 mg/dL (ref 0.20–1.20)
Total Protein: 6.6 g/dL (ref 6.4–8.3)

## 2015-04-27 ENCOUNTER — Other Ambulatory Visit: Payer: Self-pay | Admitting: *Deleted

## 2015-04-27 ENCOUNTER — Encounter (HOSPITAL_COMMUNITY): Payer: Self-pay

## 2015-04-27 ENCOUNTER — Ambulatory Visit (HOSPITAL_COMMUNITY)
Admission: RE | Admit: 2015-04-27 | Discharge: 2015-04-27 | Disposition: A | Discharge status: Home / Self Care | Payer: Medicare Other | Source: Ambulatory Visit | Attending: Internal Medicine | Admitting: Internal Medicine

## 2015-04-27 DIAGNOSIS — C787 Secondary malignant neoplasm of liver and intrahepatic bile duct: Secondary | ICD-10-CM | POA: Diagnosis not present

## 2015-04-27 DIAGNOSIS — C329 Malignant neoplasm of larynx, unspecified: Secondary | ICD-10-CM | POA: Diagnosis not present

## 2015-04-27 DIAGNOSIS — I2510 Atherosclerotic heart disease of native coronary artery without angina pectoris: Secondary | ICD-10-CM | POA: Insufficient documentation

## 2015-04-27 DIAGNOSIS — N281 Cyst of kidney, acquired: Secondary | ICD-10-CM | POA: Diagnosis not present

## 2015-04-27 DIAGNOSIS — Z930 Tracheostomy status: Secondary | ICD-10-CM | POA: Insufficient documentation

## 2015-04-27 DIAGNOSIS — N289 Disorder of kidney and ureter, unspecified: Secondary | ICD-10-CM | POA: Diagnosis not present

## 2015-04-27 DIAGNOSIS — I714 Abdominal aortic aneurysm, without rupture: Secondary | ICD-10-CM | POA: Insufficient documentation

## 2015-04-27 DIAGNOSIS — M5136 Other intervertebral disc degeneration, lumbar region: Secondary | ICD-10-CM | POA: Diagnosis not present

## 2015-04-27 DIAGNOSIS — C3411 Malignant neoplasm of upper lobe, right bronchus or lung: Secondary | ICD-10-CM | POA: Insufficient documentation

## 2015-04-27 DIAGNOSIS — R911 Solitary pulmonary nodule: Secondary | ICD-10-CM | POA: Diagnosis not present

## 2015-04-27 DIAGNOSIS — I700 Atherosclerosis of aorta: Secondary | ICD-10-CM | POA: Insufficient documentation

## 2015-04-27 DIAGNOSIS — K869 Disease of pancreas, unspecified: Secondary | ICD-10-CM | POA: Insufficient documentation

## 2015-04-27 MED ORDER — IOHEXOL 300 MG/ML  SOLN
100.0000 mL | Freq: Once | INTRAMUSCULAR | Status: AC | PRN
  Administered 2015-04-27: 100 mL via INTRAVENOUS

## 2015-04-28 ENCOUNTER — Other Ambulatory Visit (HOSPITAL_BASED_OUTPATIENT_CLINIC_OR_DEPARTMENT_OTHER): Payer: Medicare Other

## 2015-04-28 ENCOUNTER — Telehealth: Payer: Self-pay | Admitting: Internal Medicine

## 2015-04-28 ENCOUNTER — Ambulatory Visit (HOSPITAL_BASED_OUTPATIENT_CLINIC_OR_DEPARTMENT_OTHER): Payer: Medicare Other | Admitting: Internal Medicine

## 2015-04-28 ENCOUNTER — Encounter: Payer: Self-pay | Admitting: Internal Medicine

## 2015-04-28 VITALS — BP 145/90 | HR 104 | Temp 98.6°F | Resp 18 | Ht 66.0 in | Wt 156.1 lb

## 2015-04-28 DIAGNOSIS — C787 Secondary malignant neoplasm of liver and intrahepatic bile duct: Secondary | ICD-10-CM

## 2015-04-28 DIAGNOSIS — C3411 Malignant neoplasm of upper lobe, right bronchus or lung: Secondary | ICD-10-CM

## 2015-04-28 DIAGNOSIS — Z8521 Personal history of malignant neoplasm of larynx: Secondary | ICD-10-CM | POA: Diagnosis not present

## 2015-04-28 DIAGNOSIS — C329 Malignant neoplasm of larynx, unspecified: Secondary | ICD-10-CM

## 2015-04-28 LAB — CBC WITH DIFFERENTIAL/PLATELET
BASO%: 0.6 % (ref 0.0–2.0)
Basophils Absolute: 0 10*3/uL (ref 0.0–0.1)
EOS ABS: 0 10*3/uL (ref 0.0–0.5)
EOS%: 0.3 % (ref 0.0–7.0)
HCT: 40.1 % (ref 38.4–49.9)
HEMOGLOBIN: 12.9 g/dL — AB (ref 13.0–17.1)
LYMPH%: 17.7 % (ref 14.0–49.0)
MCH: 28.9 pg (ref 27.2–33.4)
MCHC: 32.2 g/dL (ref 32.0–36.0)
MCV: 89.6 fL (ref 79.3–98.0)
MONO#: 0.4 10*3/uL (ref 0.1–0.9)
MONO%: 6.8 % (ref 0.0–14.0)
NEUT%: 74.6 % (ref 39.0–75.0)
NEUTROS ABS: 4.9 10*3/uL (ref 1.5–6.5)
Platelets: 193 10*3/uL (ref 140–400)
RBC: 4.47 10*6/uL (ref 4.20–5.82)
RDW: 17.3 % — AB (ref 11.0–14.6)
WBC: 6.6 10*3/uL (ref 4.0–10.3)
lymph#: 1.2 10*3/uL (ref 0.9–3.3)

## 2015-04-28 LAB — COMPREHENSIVE METABOLIC PANEL (CC13)
ALBUMIN: 3.5 g/dL (ref 3.5–5.0)
ALK PHOS: 116 U/L (ref 40–150)
ALT: 17 U/L (ref 0–55)
AST: 27 U/L (ref 5–34)
Anion Gap: 12 mEq/L — ABNORMAL HIGH (ref 3–11)
BILIRUBIN TOTAL: 0.34 mg/dL (ref 0.20–1.20)
BUN: 9.4 mg/dL (ref 7.0–26.0)
CO2: 24 meq/L (ref 22–29)
Calcium: 9.4 mg/dL (ref 8.4–10.4)
Chloride: 105 mEq/L (ref 98–109)
Creatinine: 0.8 mg/dL (ref 0.7–1.3)
EGFR: >90 mL/min/{1.73_m2} (ref >90)
GLUCOSE: 97 mg/dL (ref 70–140)
Potassium: 3.8 mEq/L (ref 3.5–5.1)
SODIUM: 141 meq/L (ref 136–145)
TOTAL PROTEIN: 7.1 g/dL (ref 6.4–8.3)

## 2015-04-28 NOTE — Progress Notes (Signed)
[place] Telephone:([phone]) [phone]   Fax:(336) [place], [place] [phone]  PRINCIPAL DIAGNOSES:  1. 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 2.  metastatic non-small cell lung cancer initially diagnosed as Stage IA non-small-cell lung cancer diagnosed in June 2011.  PRIOR THERAPY:  1. Status post total laryngectomy with. Bilateral selective lymph node dissection as well as left superior parotidectomy with nerve dissection under the care of Dr. Redmond Baseman with positive resection margin. 2. Status post course of concurrent chemoradiation with weekly cisplatin. Last dose of chemotherapy was given January 08, 2007. 3. Status post curative radiotherapy with tomotherapy to the lung lesions completed December 31, 2009 under the care of Dr. Pablo Ledger. 4. Systemic chemotherapy with carboplatin for an AUC of 5 and paclitaxel 175 mg meter squared. Status post 6 cycles.   CURRENT THERAPY: Observation.  INTERVAL HISTORY: KAZUO DURNIL 74 y.o. male returns to the clinic today for follow-up visit. The patient is feeling fine today with no specific complaints. He is tolerating his systemic chemotherapy with carboplatin and paclitaxel fairly well with no significant adverse effects. He denied having any significant nausea or vomiting, no fever or chills. He denied having any significant chest pain, shortness of breath, cough or hemoptysis. The patient had repeat CT scan of the chest, abdomen and pelvis performed recently and he is here for evaluation and discussion of his scan results.   MEDICAL HISTORY: Past Medical History  Diagnosis Date  . Hypertension   . Stroke [facility]) 2002    left side weakness  . Cancer ([place]) 2009    SUPERGLOTTIS INVASIVE SQ CELL CA  . Full code status 03/17/2015    ALLERGIES:  has No Known Allergies.  MEDICATIONS:  Current Outpatient  Prescriptions  Medication Sig Dispense Refill  . albuterol (PROVENTIL) (2.5 MG/3ML) 0.083% nebulizer solution Take 2.5 mg by nebulization every 6 (six) hours as needed for wheezing or shortness of breath.    Marland Kitchen amLODipine (NORVASC) 5 MG tablet Take 1 tablet (5 mg total) by mouth at bedtime. 90 tablet 3  . aspirin 81 MG tablet Take 81 mg by mouth every morning.     . baclofen (LIORESAL) 10 MG tablet Take 1 tablet (10 mg total) by mouth at bedtime. 90 each 3  . cholecalciferol (VITAMIN D) 1000 UNITS tablet Take 1,000 Units by mouth every morning.     . diphenhydrAMINE (BENADRYL) 25 MG tablet Take 50 mg by mouth every 8 (eight) hours as needed.    . finasteride (PROSCAR) 5 MG tablet Take 1 tablet (5 mg total) by mouth every morning. 90 tablet 3  . loratadine (CLARITIN) 10 MG tablet Take 10 mg by mouth daily.    . metoprolol succinate (TOPROL-XL) 25 MG 24 hr tablet Take 1 tablet (25 mg total) by mouth every morning. 90 tablet 3  . prochlorperazine (COMPAZINE) 10 MG tablet Take 1 tablet (10 mg total) by mouth every 6 (six) hours as needed for nausea or vomiting. 30 tablet 0  . simvastatin (ZOCOR) 20 MG tablet Take 1 tablet (20 mg total) by mouth every evening. 30 tablet 1  . tiZANidine (ZANAFLEX) 4 MG tablet Take 1 tablet (4 mg total) by mouth 3 (three) times daily as needed for muscle spasms. 90 tablet 1   No current facility-administered medications for this visit.    SURGICAL HISTORY:  Past Surgical History  Procedure Laterality Date  . Tracheostomy  2009    REVIEW OF SYSTEMS:  Constitutional: negative Eyes: negative Ears, nose, mouth, throat, and face: negative Respiratory: negative Cardiovascular: negative Gastrointestinal: negative Genitourinary:negative Integument/breast: negative Hematologic/lymphatic: negative Musculoskeletal:negative Neurological: negative Behavioral/Psych: negative Endocrine: negative Allergic/Immunologic: negative   PHYSICAL EXAMINATION: General  appearance: alert, cooperative, fatigued and no distress Head: Normocephalic, without obvious abnormality, atraumatic Neck: no adenopathy, no JVD, supple, symmetrical, trachea midline and thyroid not enlarged, symmetric, no tenderness/mass/nodules Lymph nodes: Cervical, supraclavicular, and axillary nodes normal. Resp: clear to auscultation bilaterally Back: symmetric, no curvature. ROM normal. No CVA tenderness. Cardio: regular rate and rhythm, S1, S2 normal, no murmur, click, rub or gallop GI: soft, non-tender; bowel sounds normal; no masses,  no organomegaly Extremities: extremities normal, atraumatic, no cyanosis or edema Neurologic: Alert and oriented X 3, normal strength and tone. Normal symmetric reflexes. Normal coordination and gait  ECOG PERFORMANCE STATUS: 1 - Symptomatic but completely ambulatory  Blood pressure 145/90, pulse 104, temperature 98.6 F (37 C), temperature source Oral, resp. rate 18, height '5\' 6"'$  (1.676 m), weight 156 lb 1.6 oz (70.806 kg), SpO2 99 %.  LABORATORY DATA: Lab Results  Component Value Date   WBC 6.6 04/28/2015   HGB 12.9* 04/28/2015   HCT 40.1 04/28/2015   MCV 89.6 04/28/2015   PLT 193 04/28/2015      Chemistry      Component Value Date/Time   NA 141 04/21/2015 1004   NA 139 01/19/2012 0942   NA 141 03/31/2010 1005   K 3.8 04/21/2015 1004   K 4.1 01/19/2012 0942   K 3.7 03/31/2010 1005   CL 103 08/02/2012 1107   CL 96* 01/19/2012 0942   CL 105 03/31/2010 1005   CO2 24 04/21/2015 1004   CO2 28 01/19/2012 0942   CO2 31 03/31/2010 1005   BUN 10.6 04/21/2015 1004   BUN 14 01/19/2012 0942   BUN 12 03/31/2010 1005   CREATININE 0.7 04/21/2015 1004   CREATININE 1.2 01/19/2012 0942   CREATININE 1.02 03/31/2010 1005      Component Value Date/Time   CALCIUM 9.0 04/21/2015 1004   CALCIUM 9.5 01/19/2012 0942   CALCIUM 9.0 03/31/2010 1005   ALKPHOS 120 04/21/2015 1004   ALKPHOS 64 01/19/2012 0942   ALKPHOS 60 03/31/2010 1005   AST 19  04/21/2015 1004   AST 27 01/19/2012 0942   AST 21 03/31/2010 1005   ALT 12 04/21/2015 1004   ALT 23 01/19/2012 0942   ALT 19 03/31/2010 1005   BILITOT 0.41 04/21/2015 1004   BILITOT 0.50 01/19/2012 0942   BILITOT 0.4 03/31/2010 1005       RADIOGRAPHIC STUDIES: Ct Chest W Contrast  04/27/2015  CLINICAL DATA:  Supraglottic invasive squamous cell carcinoma diagnosed 5/8 with laryngectomy. Bilateral lymph node dissection. Metastatic non-small-cell lung cancer diagnosed in 2011. Liver metastasis in 2014. Last chemotherapy 1 week ago. EXAM: CT CHEST, ABDOMEN, AND PELVIS WITH CONTRAST TECHNIQUE: Multidetector CT imaging of the chest, abdomen and pelvis was performed following the standard protocol during bolus administration of intravenous contrast. CONTRAST:  119m OMNIPAQUE IOHEXOL 300 MG/ML  SOLN COMPARISON:  02/23/2015 FINDINGS: CT CHEST FINDINGS Mediastinum/Nodes: Probable thyroidectomy. No supraclavicular adenopathy. Aortic and branch vessel atherosclerosis. Mild cardiomegaly with multivessel coronary artery atherosclerosis. No central pulmonary embolism, on this non-dedicated study. No mediastinal or hilar adenopathy. Similar dilatation of the upper esophagus, nonspecific. Lungs/Pleura: No pleural fluid. Tracheostomy. Mild degradation secondary to patient's left arm not being raised above the head. Posterior  left upper lobe pulmonary nodule is similar at 5 mm (image 13, series 4). Similar configuration of probable atelectasis involving the inferior right upper lobe (image 22, series 4). Maximal thickness of 9 mm on sagittal image 37 today, unchanged. Musculoskeletal: Remote left rib trauma. CT ABDOMEN PELVIS FINDINGS Hepatobiliary: Hepatic metastasis are suboptimally evaluated secondary to bolus timing. Multiple low-density lesions are again identified. Index segment 6 lesion measures 3.5 x 4.0 cm (image 56, series 2), 3.2 x 3.7 cm on the prior. A pericholecystic right hepatic lobe lesion measures  3.7 x 2.6 cm (image 58, series 2), 3.9 x 2.9 cm on the prior. Index lateral segment left liver lobe lesion measures 1.9 cm (image 45, series 2), 2.2 cm on the prior exam (when remeasured). Normal gallbladder, without biliary ductal dilatation. Pancreas: Vague hypoattenuation involving the pancreatic tail with calcification. 3.3 x 3.3 cm (image 47., series 2). 3.5 x 3.3 cm on the prior exam, felt to be similar. Spleen: Normal in size, without focal abnormality. Adrenals/Urinary Tract: Normal adrenal glands. Too small to characterize right renal lesion is likely a cyst. Lower pole left renal cyst. No hydronephrosis. Normal urinary bladder. Stomach/Bowel: Normal stomach, without wall thickening. Scattered colonic diverticula. Normal terminal ileum and appendix. Normal small bowel. Vascular/Lymphatic: Aortic and branch vessel atherosclerosis. Infrarenal abdominal aortic aneurysm, 4.3 x 4.5 cm versus 4.1 x 4.2 cm on the prior. No extension into the iliacs. No abdominopelvic adenopathy. Reproductive: Normal prostate. Other: No significant free fluid. Musculoskeletal: Degenerative disc disease at L4-5. IMPRESSION: CT CHEST IMPRESSION 1.  No acute process or evidence of metastatic disease in the chest. 2. Similar inferior right lower lobe opacity, favoring atelectasis with volume loss. 3.  Atherosclerosis, including within the coronary arteries. CT ABDOMEN AND PELVIS IMPRESSION 1. Widespread hepatic metastasis. Overall felt to be similar. Some lesions measure slightly different, favored to be due to measurement error. 2. No extrahepatic metastatic disease within the abdomen or pelvis. 3. Similar to slight increasing infrarenal abdominal aortic aneurysm, maximally 4.5 cm. 4. Pancreatic tail lesion is not significantly changed and suspicious for metastasis. Differential considerations include an area of chronic pancreatitis or less likely (given stability) primary pancreatic neoplasm. Electronically Signed   By: Abigail Miyamoto  M.D.   On: 04/27/2015 13:50   Ct Abdomen Pelvis W Contrast  04/27/2015  CLINICAL DATA:  Supraglottic invasive squamous cell carcinoma diagnosed 5/8 with laryngectomy. Bilateral lymph node dissection. Metastatic non-small-cell lung cancer diagnosed in 2011. Liver metastasis in 2014. Last chemotherapy 1 week ago. EXAM: CT CHEST, ABDOMEN, AND PELVIS WITH CONTRAST TECHNIQUE: Multidetector CT imaging of the chest, abdomen and pelvis was performed following the standard protocol during bolus administration of intravenous contrast. CONTRAST:  182m OMNIPAQUE IOHEXOL 300 MG/ML  SOLN COMPARISON:  02/23/2015 FINDINGS: CT CHEST FINDINGS Mediastinum/Nodes: Probable thyroidectomy. No supraclavicular adenopathy. Aortic and branch vessel atherosclerosis. Mild cardiomegaly with multivessel coronary artery atherosclerosis. No central pulmonary embolism, on this non-dedicated study. No mediastinal or hilar adenopathy. Similar dilatation of the upper esophagus, nonspecific. Lungs/Pleura: No pleural fluid. Tracheostomy. Mild degradation secondary to patient's left arm not being raised above the head. Posterior left upper lobe pulmonary nodule is similar at 5 mm (image 13, series 4). Similar configuration of probable atelectasis involving the inferior right upper lobe (image 22, series 4). Maximal thickness of 9 mm on sagittal image 37 today, unchanged. Musculoskeletal: Remote left rib trauma. CT ABDOMEN PELVIS FINDINGS Hepatobiliary: Hepatic metastasis are suboptimally evaluated secondary to bolus timing. Multiple low-density lesions are again identified. Index segment 6  lesion measures 3.5 x 4.0 cm (image 56, series 2), 3.2 x 3.7 cm on the prior. A pericholecystic right hepatic lobe lesion measures 3.7 x 2.6 cm (image 58, series 2), 3.9 x 2.9 cm on the prior. Index lateral segment left liver lobe lesion measures 1.9 cm (image 45, series 2), 2.2 cm on the prior exam (when remeasured). Normal gallbladder, without biliary ductal  dilatation. Pancreas: Vague hypoattenuation involving the pancreatic tail with calcification. 3.3 x 3.3 cm (image 47., series 2). 3.5 x 3.3 cm on the prior exam, felt to be similar. Spleen: Normal in size, without focal abnormality. Adrenals/Urinary Tract: Normal adrenal glands. Too small to characterize right renal lesion is likely a cyst. Lower pole left renal cyst. No hydronephrosis. Normal urinary bladder. Stomach/Bowel: Normal stomach, without wall thickening. Scattered colonic diverticula. Normal terminal ileum and appendix. Normal small bowel. Vascular/Lymphatic: Aortic and branch vessel atherosclerosis. Infrarenal abdominal aortic aneurysm, 4.3 x 4.5 cm versus 4.1 x 4.2 cm on the prior. No extension into the iliacs. No abdominopelvic adenopathy. Reproductive: Normal prostate. Other: No significant free fluid. Musculoskeletal: Degenerative disc disease at L4-5. IMPRESSION: CT CHEST IMPRESSION 1.  No acute process or evidence of metastatic disease in the chest. 2. Similar inferior right lower lobe opacity, favoring atelectasis with volume loss. 3.  Atherosclerosis, including within the coronary arteries. CT ABDOMEN AND PELVIS IMPRESSION 1. Widespread hepatic metastasis. Overall felt to be similar. Some lesions measure slightly different, favored to be due to measurement error. 2. No extrahepatic metastatic disease within the abdomen or pelvis. 3. Similar to slight increasing infrarenal abdominal aortic aneurysm, maximally 4.5 cm. 4. Pancreatic tail lesion is not significantly changed and suspicious for metastasis. Differential considerations include an area of chronic pancreatitis or less likely (given stability) primary pancreatic neoplasm. Electronically Signed   By: Abigail Miyamoto M.D.   On: 04/27/2015 13:50    ASSESSMENT AND PLAN:  This is a very pleasant 74 years old African-American male with metastatic non-small cell lung cancer , squamous cell carcinoma completed systemic chemotherapy with carboplatin  and paclitaxel status post 6 cycles.  The patient is tolerating his treatment well except for fatigue and aching pain after the Neulasta injection. The recent CT scan of the chest, abdomen and pelvis showed no evidence for disease progression. I discussed the scan results with the patient today. I recommended for him to continue on observation with repeat CT scan of the chest, abdomen and pelvis in 3 months for restaging of his disease.  He was advised to call immediately if he has any concerning symptoms in the interval. The patient voices understanding of current disease status and treatment options and is in agreement with the current care plan.  All questions were answered. The patient knows to call the clinic with any problems, questions or concerns. We can certainly see the patient much sooner if necessary.  Disclaimer: This note was dictated with voice recognition software. Similar sounding words can inadvertently be transcribed and may not be corrected upon review.

## 2015-04-28 NOTE — Telephone Encounter (Signed)
per pof to sch pt appt-cx chemo & lab until next visit-gave pt copy of avs-adv central sch will call to sch scan-stated he will senf daughter [name] back to pick up contrast later

## 2015-04-30 ENCOUNTER — Ambulatory Visit: Payer: Medicare Other

## 2015-06-21 MED FILL — ALBUTEROL 0.083 MG/ML SOLN: (2.5 MG/3ML | 30 days supply | Qty: 360 | Fill #3

## 2015-06-28 ENCOUNTER — Ambulatory Visit (INDEPENDENT_AMBULATORY_CARE_PROVIDER_SITE_OTHER): Payer: Medicare Other | Admitting: Internal Medicine

## 2015-06-28 ENCOUNTER — Encounter: Payer: Self-pay | Admitting: Internal Medicine

## 2015-06-28 VITALS — BP 178/88 | HR 99 | Temp 98.6°F | Resp 12 | Ht 66.5 in | Wt 159.1 lb

## 2015-06-28 DIAGNOSIS — I10 Essential (primary) hypertension: Secondary | ICD-10-CM

## 2015-06-28 DIAGNOSIS — Z0000 Encounter for general adult medical examination without abnormal findings: Secondary | ICD-10-CM | POA: Diagnosis not present

## 2015-06-28 DIAGNOSIS — Z8673 Personal history of transient ischemic attack (TIA), and cerebral infarction without residual deficits: Secondary | ICD-10-CM

## 2015-06-28 DIAGNOSIS — Z23 Encounter for immunization: Secondary | ICD-10-CM | POA: Diagnosis not present

## 2015-06-28 DIAGNOSIS — I69359 Hemiplegia and hemiparesis following cerebral infarction affecting unspecified side: Secondary | ICD-10-CM

## 2015-06-28 NOTE — Patient Instructions (Addendum)
We will check the cholesterol next time but do not need blood work today.   Health Maintenance, Male A healthy lifestyle and preventative care can promote health and wellness.  Maintain regular health, dental, and eye exams.  Eat a healthy diet. Foods like vegetables, fruits, whole grains, low-fat dairy products, and lean protein foods contain the nutrients you need and are low in calories. Decrease your intake of foods high in solid fats, added sugars, and salt. Get information about a proper diet from your health care provider, if necessary.  Regular physical exercise is one of the most important things you can do for your health. Most adults should get at least 150 minutes of moderate-intensity exercise (any activity that increases your heart rate and causes you to sweat) each week. In addition, most adults need muscle-strengthening exercises on 2 or more days a week.   Maintain a healthy weight. The body mass index (BMI) is a screening tool to identify possible weight problems. It provides an estimate of body fat based on height and weight. Your health care provider can find your BMI and can help you achieve or maintain a healthy weight. For males 20 years and older:  A BMI below 18.5 is considered underweight.  A BMI of 18.5 to 24.9 is normal.  A BMI of 25 to 29.9 is considered overweight.  A BMI of 30 and above is considered obese.  Maintain normal blood lipids and cholesterol by exercising and minimizing your intake of saturated fat. Eat a balanced diet with plenty of fruits and vegetables. Blood tests for lipids and cholesterol should begin at age 12 and be repeated every 5 years. If your lipid or cholesterol levels are high, you are over age 66, or you are at high risk for heart disease, you may need your cholesterol levels checked more frequently.Ongoing high lipid and cholesterol levels should be treated with medicines if diet and exercise are not working.  If you smoke, find out  from your health care provider how to quit. If you do not use tobacco, do not start.  Lung cancer screening is recommended for adults aged 53-80 years who are at high risk for developing lung cancer because of a history of smoking. A yearly low-dose CT scan of the lungs is recommended for people who have at least a 30-pack-year history of smoking and are current smokers or have quit within the past 15 years. A pack year of smoking is smoking an average of 1 pack of cigarettes a day for 1 year (for example, a 30-pack-year history of smoking could mean smoking 1 pack a day for 30 years or 2 packs a day for 15 years). Yearly screening should continue until the smoker has stopped smoking for at least 15 years. Yearly screening should be stopped for people who develop a health problem that would prevent them from having lung cancer treatment.  If you choose to drink alcohol, do not have more than 2 drinks per day. One drink is considered to be 12 oz (360 mL) of beer, 5 oz (150 mL) of wine, or 1.5 oz (45 mL) of liquor.  Avoid the use of street drugs. Do not share needles with anyone. Ask for help if you need support or instructions about stopping the use of drugs.  High blood pressure causes heart disease and increases the risk of stroke. High blood pressure is more likely to develop in:  People who have blood pressure in the end of the normal range (100-139/85-89  mm Hg).  People who are overweight or obese.  People who are African American.  If you are 34-44 years of age, have your blood pressure checked every 3-5 years. If you are 31 years of age or older, have your blood pressure checked every year. You should have your blood pressure measured twice--once when you are at a hospital or clinic, and once when you are not at a hospital or clinic. Record the average of the two measurements. To check your blood pressure when you are not at a hospital or clinic, you can use:  An automated blood pressure  machine at a pharmacy.  A home blood pressure monitor.  If you are 45-36 years old, ask your health care provider if you should take aspirin to prevent heart disease.  Diabetes screening involves taking a blood sample to check your fasting blood sugar level. This should be done once every 3 years after age 41 if you are at a normal weight and without risk factors for diabetes. Testing should be considered at a younger age or be carried out more frequently if you are overweight and have at least 1 risk factor for diabetes.  Colorectal cancer can be detected and often prevented. Most routine colorectal cancer screening begins at the age of 8 and continues through age 72. However, your health care provider may recommend screening at an earlier age if you have risk factors for colon cancer. On a yearly basis, your health care provider may provide home test kits to check for hidden blood in the stool. A small camera at the end of a tube may be used to directly examine the colon (sigmoidoscopy or colonoscopy) to detect the earliest forms of colorectal cancer. Talk to your health care provider about this at age 26 when routine screening begins. A direct exam of the colon should be repeated every 5-10 years through age 71, unless early forms of precancerous polyps or small growths are found.  People who are at an increased risk for hepatitis B should be screened for this virus. You are considered at high risk for hepatitis B if:  You were born in a country where hepatitis B occurs often. Talk with your health care provider about which countries are considered high risk.  Your parents were born in a high-risk country and you have not received a shot to protect against hepatitis B (hepatitis B vaccine).  You have HIV or AIDS.  You use needles to inject street drugs.  You live with, or have sex with, someone who has hepatitis B.  You are a man who has sex with other men (MSM).  You get hemodialysis  treatment.  You take certain medicines for conditions like cancer, organ transplantation, and autoimmune conditions.  Hepatitis C blood testing is recommended for all people born from [birth year] through 1966 and any individual with known risk factors for hepatitis C.  Healthy men should no longer receive prostate-specific antigen (PSA) blood tests as part of routine cancer screening. Talk to your health care provider about prostate cancer screening.  Testicular cancer screening is not recommended for adolescents or adult males who have no symptoms. Screening includes self-exam, a health care provider exam, and other screening tests. Consult with your health care provider about any symptoms you have or any concerns you have about testicular cancer.  Practice safe sex. Use condoms and avoid high-risk sexual practices to reduce the spread of sexually transmitted infections (STIs).  You should be screened for STIs, including gonorrhea  and chlamydia if:  You are sexually active and are younger than 24 years.  You are older than 24 years, and your health care provider tells you that you are at risk for this type of infection.  Your sexual activity has changed since you were last screened, and you are at an increased risk for chlamydia or gonorrhea. Ask your health care provider if you are at risk.  If you are at risk of being infected with HIV, it is recommended that you take a prescription medicine daily to prevent HIV infection. This is called pre-exposure prophylaxis (PrEP). You are considered at risk if:  You are a man who has sex with other men (MSM).  You are a heterosexual man who is sexually active with multiple partners.  You take drugs by injection.  You are sexually active with a partner who has HIV.  Talk with your health care provider about whether you are at high risk of being infected with HIV. If you choose to begin PrEP, you should first be tested for HIV. You should then be tested  every 3 months for as long as you are taking PrEP.  Use sunscreen. Apply sunscreen liberally and repeatedly throughout the day. You should seek shade when your shadow is shorter than you. Protect yourself by wearing long sleeves, pants, a wide-brimmed hat, and sunglasses year round whenever you are outdoors.  Tell your health care provider of new moles or changes in moles, especially if there is a change in shape or color. Also, tell your health care provider if a mole is larger than the size of a pencil eraser.  A one-time screening for abdominal aortic aneurysm (AAA) and surgical repair of large AAAs by ultrasound is recommended for men aged 8-75 years who are current or former smokers.  Stay current with your vaccines (immunizations).   This information is not intended to replace advice given to you by your health care provider. Make sure you discuss any questions you have with your health care provider.   Document Released: 11/25/2007 Document Revised: 06/19/2014 Document Reviewed: 10/24/2010 Elsevier Interactive Patient Education [employer].

## 2015-06-28 NOTE — Progress Notes (Signed)
Pre visit review using our clinic review tool, if applicable. No additional management support is needed unless otherwise documented below in the visit note. 

## 2015-06-28 NOTE — Progress Notes (Signed)
   Subjective:    Patient ID: [name], male    DOB: [birth year], 75 y.o.   MRN: [id number]  HPI Here for medicare wellness, no new complaints. Please see A/P for status and treatment of chronic medical problems.   Diet: heart healthy Physical activity: sedentary Depression/mood screen: negative Hearing: intact to whispered voice Visual acuity: grossly normal, performs annual eye exam  ADLs: capable Fall risk: none Home safety: good Cognitive evaluation: intact to orientation, naming, recall and repetition EOL planning: adv directives discussed, in place  I have personally reviewed and have noted 1. The patient's medical and social history - reviewed today no changes 2. Their use of alcohol, tobacco or illicit drugs 3. Their current medications and supplements 4. The patient's functional ability including ADL's, fall risks, home safety risks and hearing or visual impairment. 5. Diet and physical activities 6. Evidence for depression or mood disorders 7. Care team reviewed and updated (available in snapshot)  Review of Systems  Constitutional: Positive for activity change. Negative for fever, appetite change, fatigue and unexpected weight change.  HENT: Negative.   Eyes: Negative.   Respiratory: Negative for cough, chest tightness, shortness of breath and wheezing.   Cardiovascular: Negative for chest pain, palpitations and leg swelling.  Gastrointestinal: Negative for abdominal pain, diarrhea, constipation and abdominal distention.  Musculoskeletal: Positive for arthralgias and gait problem.  Skin: Negative.   Neurological: Positive for tremors, speech difficulty and weakness. Negative for dizziness and light-headedness.  Psychiatric/Behavioral: Negative.       Objective:   Physical Exam  Constitutional: He is oriented to person, place, and time. He appears well-developed and well-nourished.  HENT:  Head: Normocephalic and atraumatic.  Voice box in place, speaks  through that.   Eyes: EOM are normal.  Neck: Normal range of motion. No JVD present.  Cardiovascular: Normal rate and regular rhythm.   Pulmonary/Chest: Effort normal. No respiratory distress. He has no wheezes.  Abdominal: Soft. He exhibits no distension. There is no tenderness. There is no rebound.  Musculoskeletal: He exhibits no edema.  Neurological: He is alert and oriented to person, place, and time. Coordination abnormal.  Left arm at side, not used. In wheelchair for long distances but can walk short distances at home.   Skin: Skin is warm and dry.  Psychiatric: He has a normal mood and affect.   Filed Vitals:   06/28/15 1307  BP: 178/88  Pulse: 99  Temp: 98.6 F (37 C)  TempSrc: Oral  Resp: 12  Height: 5' 6.5" (1.689 m)  Weight: 159 lb 1.9 oz (72.176 kg)  SpO2: 97%      Assessment & Plan:  Flu and prevnar 13 given at visit.

## 2015-06-29 DIAGNOSIS — Z0000 Encounter for general adult medical examination without abnormal findings: Secondary | ICD-10-CM | POA: Insufficient documentation

## 2015-06-29 DIAGNOSIS — I69359 Hemiplegia and hemiparesis following cerebral infarction affecting unspecified side: Secondary | ICD-10-CM | POA: Insufficient documentation

## 2015-06-29 NOTE — Assessment & Plan Note (Signed)
Screening is up to date and immunizations are deferred today due to ongoing cancer treatment. He did take flu and pneumonia shot today. Checking some labs today. Counseled on home safety with his late effects from stroke. Given 10 year screening recommendations.

## 2015-06-29 NOTE — Assessment & Plan Note (Signed)
Needs lipid and HgA1c today for making sure that he is at goal. BP is slightly elevated today which is atypical for him.

## 2015-06-29 NOTE — Assessment & Plan Note (Signed)
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

## 2015-06-29 NOTE — Assessment & Plan Note (Signed)
BP elevated today although generally normal. Not taken his meds today. Will have him follow at home and work on diet better and adjust next time if needed. On norvasc, metoprolol, and finasteride. No labs today and reviewed recent labs.

## 2015-07-01 ENCOUNTER — Telehealth: Payer: Self-pay | Admitting: Internal Medicine

## 2015-07-01 ENCOUNTER — Other Ambulatory Visit: Payer: Self-pay | Admitting: Medical Oncology

## 2015-07-01 NOTE — Telephone Encounter (Signed)
Spoke with patient and wife and they are aware of the new lab time on 2/15    [name]

## 2015-07-28 ENCOUNTER — Encounter (HOSPITAL_COMMUNITY): Payer: Self-pay

## 2015-07-28 ENCOUNTER — Ambulatory Visit (HOSPITAL_COMMUNITY)
Admission: RE | Admit: 2015-07-28 | Discharge: 2015-07-28 | Disposition: A | Discharge status: Home / Self Care | Payer: Medicare Other | Source: Ambulatory Visit | Attending: Internal Medicine | Admitting: Internal Medicine

## 2015-07-28 ENCOUNTER — Other Ambulatory Visit (HOSPITAL_BASED_OUTPATIENT_CLINIC_OR_DEPARTMENT_OTHER): Payer: Medicare Other

## 2015-07-28 ENCOUNTER — Other Ambulatory Visit: Payer: Medicare Other

## 2015-07-28 DIAGNOSIS — C3411 Malignant neoplasm of upper lobe, right bronchus or lung: Secondary | ICD-10-CM | POA: Diagnosis not present

## 2015-07-28 DIAGNOSIS — C787 Secondary malignant neoplasm of liver and intrahepatic bile duct: Secondary | ICD-10-CM | POA: Diagnosis not present

## 2015-07-28 DIAGNOSIS — K228 Other specified diseases of esophagus: Secondary | ICD-10-CM | POA: Diagnosis not present

## 2015-07-28 DIAGNOSIS — I714 Abdominal aortic aneurysm, without rupture: Secondary | ICD-10-CM | POA: Diagnosis not present

## 2015-07-28 DIAGNOSIS — I2510 Atherosclerotic heart disease of native coronary artery without angina pectoris: Secondary | ICD-10-CM | POA: Diagnosis not present

## 2015-07-28 DIAGNOSIS — K869 Disease of pancreas, unspecified: Secondary | ICD-10-CM | POA: Diagnosis not present

## 2015-07-28 LAB — CBC WITH DIFFERENTIAL/PLATELET
BASO%: 0.8 % (ref 0.0–2.0)
Basophils Absolute: 0 10*3/uL (ref 0.0–0.1)
EOS ABS: 0.1 10*3/uL (ref 0.0–0.5)
EOS%: 2.1 % (ref 0.0–7.0)
HEMATOCRIT: 43.5 % (ref 38.4–49.9)
HEMOGLOBIN: 14.1 g/dL (ref 13.0–17.1)
LYMPH#: 1.2 10*3/uL (ref 0.9–3.3)
LYMPH%: 19.6 % (ref 14.0–49.0)
MCH: 27.3 pg (ref 27.2–33.4)
MCHC: 32.4 g/dL (ref 32.0–36.0)
MCV: 84.2 fL (ref 79.3–98.0)
MONO#: 0.4 10*3/uL (ref 0.1–0.9)
MONO%: 6.5 % (ref 0.0–14.0)
NEUT%: 71 % (ref 39.0–75.0)
NEUTROS ABS: 4.5 10*3/uL (ref 1.5–6.5)
PLATELETS: 179 10*3/uL (ref 140–400)
RBC: 5.16 10*6/uL (ref 4.20–5.82)
RDW: 15.4 % — AB (ref 11.0–14.6)
WBC: 6.3 10*3/uL (ref 4.0–10.3)

## 2015-07-28 LAB — COMPREHENSIVE METABOLIC PANEL
ALBUMIN: 3.6 g/dL (ref 3.5–5.0)
ALT: 14 U/L (ref 0–55)
ANION GAP: 10 meq/L (ref 3–11)
AST: 24 U/L (ref 5–34)
Alkaline Phosphatase: 123 U/L (ref 40–150)
BUN: 12.4 mg/dL (ref 7.0–26.0)
CO2: 24 meq/L (ref 22–29)
Calcium: 9.2 mg/dL (ref 8.4–10.4)
Chloride: 107 mEq/L (ref 98–109)
Creatinine: 0.9 mg/dL (ref 0.7–1.3)
GLUCOSE: 109 mg/dL (ref 70–140)
Potassium: 3.8 mEq/L (ref 3.5–5.1)
Sodium: 141 mEq/L (ref 136–145)
TOTAL PROTEIN: 7.7 g/dL (ref 6.4–8.3)
Total Bilirubin: 0.37 mg/dL (ref 0.20–1.20)

## 2015-07-28 MED ORDER — IOHEXOL 300 MG/ML  SOLN
100.0000 mL | Freq: Once | INTRAMUSCULAR | Status: AC | PRN
  Administered 2015-07-28: 100 mL via INTRAVENOUS

## 2015-08-04 ENCOUNTER — Telehealth: Payer: Self-pay | Admitting: Internal Medicine

## 2015-08-04 ENCOUNTER — Encounter: Payer: Self-pay | Admitting: Internal Medicine

## 2015-08-04 ENCOUNTER — Ambulatory Visit (HOSPITAL_BASED_OUTPATIENT_CLINIC_OR_DEPARTMENT_OTHER): Payer: Medicare Other | Admitting: Internal Medicine

## 2015-08-04 VITALS — BP 157/88 | HR 108 | Temp 98.6°F | Resp 17 | Ht 66.5 in | Wt 160.5 lb

## 2015-08-04 DIAGNOSIS — C787 Secondary malignant neoplasm of liver and intrahepatic bile duct: Secondary | ICD-10-CM

## 2015-08-04 DIAGNOSIS — Z85118 Personal history of other malignant neoplasm of bronchus and lung: Secondary | ICD-10-CM | POA: Diagnosis not present

## 2015-08-04 DIAGNOSIS — C3411 Malignant neoplasm of upper lobe, right bronchus or lung: Secondary | ICD-10-CM

## 2015-08-04 NOTE — Progress Notes (Signed)
[facility] [phone]   Fax:([phone]) [place], [place] [phone]  PRINCIPAL DIAGNOSES:  1. 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 2.  metastatic non-small cell lung cancer initially diagnosed as Stage IA non-small-cell lung cancer diagnosed in June 2011.  PRIOR THERAPY:  1. Status post total laryngectomy with. Bilateral selective lymph node dissection as well as left superior parotidectomy with nerve dissection under the care of Dr. Redmond Baseman with positive resection margin. 2. Status post course of concurrent chemoradiation with weekly cisplatin. Last dose of chemotherapy was given January 08, 2007. 3. Status post curative radiotherapy with tomotherapy to the lung lesions completed December 31, 2009 under the care of Dr. Pablo Ledger. 4. Systemic chemotherapy with carboplatin for an AUC of 5 and paclitaxel 175 mg meter squared. Status post 6 cycles.   CURRENT THERAPY: Observation.  INTERVAL HISTORY: [name] 75 y.o. male returns to the clinic today for follow-up visit. The patient is feeling fine today with no specific complaints. He has been observation for the last few months and doing very well. He gained few pounds since his last visit. He denied having any significant nausea or vomiting, no fever or chills. He denied having any significant chest pain, shortness of breath, cough or hemoptysis. The patient had repeat CT scan of the chest, abdomen and pelvis performed recently and he is here for evaluation and discussion of his scan results.   MEDICAL HISTORY: Past Medical History  Diagnosis Date  . Hypertension   . Stroke [facility]) 2002    left side weakness  . Cancer ([place]) 2009    SUPERGLOTTIS INVASIVE SQ CELL CA  . Full code status 03/17/2015    ALLERGIES:  has No Known Allergies.  MEDICATIONS:  Current Outpatient Prescriptions    Medication Sig Dispense Refill  . albuterol (PROVENTIL) (2.5 MG/3ML) 0.083% nebulizer solution Take 2.5 mg by nebulization every 6 (six) hours as needed for wheezing or shortness of breath.    Marland Kitchen amLODipine (NORVASC) 5 MG tablet Take 1 tablet (5 mg total) by mouth at bedtime. 90 tablet 3  . aspirin 81 MG tablet Take 81 mg by mouth every morning.     . baclofen (LIORESAL) 10 MG tablet Take 1 tablet (10 mg total) by mouth at bedtime. 90 each 3  . cholecalciferol (VITAMIN D) 1000 UNITS tablet Take 1,000 Units by mouth every morning.     . diphenhydrAMINE (BENADRYL) 25 MG tablet Take 50 mg by mouth every 8 (eight) hours as needed.    . finasteride (PROSCAR) 5 MG tablet Take 1 tablet (5 mg total) by mouth every morning. 90 tablet 3  . loratadine (CLARITIN) 10 MG tablet Take 10 mg by mouth daily.    . metoprolol succinate (TOPROL-XL) 25 MG 24 hr tablet Take 1 tablet (25 mg total) by mouth every morning. 90 tablet 3  . prochlorperazine (COMPAZINE) 10 MG tablet Take 1 tablet (10 mg total) by mouth every 6 (six) hours as needed for nausea or vomiting. 30 tablet 0  . simvastatin (ZOCOR) 20 MG tablet Take 1 tablet (20 mg total) by mouth every evening. 30 tablet 1  . tiZANidine (ZANAFLEX) 4 MG tablet Take 1 tablet (4 mg total) by mouth 3 (three) times daily as needed for muscle spasms. 90 tablet 1   No current facility-administered medications for this visit.    SURGICAL  HISTORY:  Past Surgical History  Procedure Laterality Date  . Tracheostomy  2009    REVIEW OF SYSTEMS:  A comprehensive review of systems was negative.   PHYSICAL EXAMINATION: General appearance: alert, cooperative, fatigued and no distress Head: Normocephalic, without obvious abnormality, atraumatic Neck: no adenopathy, no JVD, supple, symmetrical, trachea midline and thyroid not enlarged, symmetric, no tenderness/mass/nodules Lymph nodes: Cervical, supraclavicular, and axillary nodes normal. Resp: clear to auscultation  bilaterally Back: symmetric, no curvature. ROM normal. No CVA tenderness. Cardio: regular rate and rhythm, S1, S2 normal, no murmur, click, rub or gallop GI: soft, non-tender; bowel sounds normal; no masses,  no organomegaly Extremities: extremities normal, atraumatic, no cyanosis or edema Neurologic: Alert and oriented X 3, normal strength and tone. Normal symmetric reflexes. Normal coordination and gait  ECOG PERFORMANCE STATUS: 1 - Symptomatic but completely ambulatory  Blood pressure 157/88, pulse 108, temperature 98.6 F (37 C), temperature source Oral, resp. rate 17, height 5' 6.5" (1.689 m), weight 160 lb 8 oz (72.802 kg), SpO2 97 %.  LABORATORY DATA: Lab Results  Component Value Date   WBC 6.3 07/28/2015   HGB 14.1 07/28/2015   HCT 43.5 07/28/2015   MCV 84.2 07/28/2015   PLT 179 07/28/2015      Chemistry      Component Value Date/Time   NA 141 07/28/2015 0844   NA 139 01/19/2012 0942   NA 141 03/31/2010 1005   K 3.8 07/28/2015 0844   K 4.1 01/19/2012 0942   K 3.7 03/31/2010 1005   CL 103 08/02/2012 1107   CL 96* 01/19/2012 0942   CL 105 03/31/2010 1005   CO2 24 07/28/2015 0844   CO2 28 01/19/2012 0942   CO2 31 03/31/2010 1005   BUN 12.4 07/28/2015 0844   BUN 14 01/19/2012 0942   BUN 12 03/31/2010 1005   CREATININE 0.9 07/28/2015 0844   CREATININE 1.2 01/19/2012 0942   CREATININE 1.02 03/31/2010 1005      Component Value Date/Time   CALCIUM 9.2 07/28/2015 0844   CALCIUM 9.5 01/19/2012 0942   CALCIUM 9.0 03/31/2010 1005   ALKPHOS 123 07/28/2015 0844   ALKPHOS 64 01/19/2012 0942   ALKPHOS 60 03/31/2010 1005   AST 24 07/28/2015 0844   AST 27 01/19/2012 0942   AST 21 03/31/2010 1005   ALT 14 07/28/2015 0844   ALT 23 01/19/2012 0942   ALT 19 03/31/2010 1005   BILITOT 0.37 07/28/2015 0844   BILITOT 0.50 01/19/2012 0942   BILITOT 0.4 03/31/2010 1005       RADIOGRAPHIC STUDIES: Ct Chest W Contrast  07/28/2015  CLINICAL DATA:  Restaging of supraglottic  squamous cell carcinoma in 2008. Metastatic non-small-cell lung cancer in 2011. Liver metastasis in 2014. EXAM: CT CHEST, ABDOMEN, AND PELVIS WITH CONTRAST TECHNIQUE: Multidetector CT imaging of the chest, abdomen and pelvis was performed following the standard protocol during bolus administration of intravenous contrast. CONTRAST:  162m OMNIPAQUE IOHEXOL 300 MG/ML  SOLN COMPARISON:  04/27/2015 FINDINGS: CT CHEST FINDINGS Mediastinum/Nodes: Tracheostomy. Tortuous, atherosclerotic thoracic aorta. Mild cardiomegaly with multivessel coronary artery atherosclerosis. No central pulmonary embolism, on this non-dedicated study. No mediastinal or hilar adenopathy. Esophageal dilatation is moderate. Lungs/Pleura: No pleural fluid.  Moderate centrilobular emphysema. Similar configuration of inferior right upper lobe opacity, again favoring atelectasis. Example image 21/series 4. 3 mm right middle lobe pulmonary nodule on image 25/series 4 was present on the prior exam but appears slightly more conspicuous today. Posterior left upper lobe pulmonary nodule measures 7 mm on  image 15/series 4 versus 5 mm on the prior. This was present back on 08/02/2012 at 4 mm. Musculoskeletal: Posterior lateral left-sided rib deformities are similar and likely due to remote trauma. No suspicious osseous lesion. CT ABDOMEN PELVIS FINDINGS Hepatobiliary: Extensive hepatic metastasis. Difficulty in comparing to the prior exam, secondary to differences in bolus timing today. Index anterior right hepatic lobe lesion measures 3.5 cm on image 52/series 2. Compare similar on the prior exam (when remeasured). More inferior right hepatic lobe index lesion measures 3.4 x 3.4 cm on image 55/series 2. 3.5 x 4.0 cm on the prior. anterior left hepatic lobe lesion measures 2.1 cm on image 49/series 2. Compare 2.4 cm on the prior exam (when remeasured). Normal gallbladder, without biliary ductal dilatation. Pancreas: Pancreatic tail partially calcified lesion  again identified. 3.6 x 3.2 cm on image 56/series 2. Compare 3.3 x 3.3 cm on the prior. Felt to be similar. No acute pancreatitis or duct dilatation. Spleen: Normal in size, without focal abnormality. Adrenals/Urinary Tract: Normal adrenal glands. Mild renal cortical thinning bilaterally. Lower pole left renal cyst. No hydronephrosis. Normal urinary bladder. Stomach/Bowel: Normal stomach, without wall thickening. Scattered colonic diverticula. Normal terminal ileum and appendix. Normal small bowel. Vascular/Lymphatic: Aortic and branch vessel atherosclerosis. Infrarenal abdominal aortic aneurysm. Example 4.2 by 4.2 cm today versus 4.5 x 4.3 cm on the prior. Similar. No extension into the iliacs. No surrounding hemorrhage. No abdominopelvic adenopathy. Reproductive: Normal prostate. Other: No significant free fluid. Musculoskeletal: No acute osseous abnormality. IMPRESSION: CT CHEST IMPRESSION 1. No evidence of thoracic adenopathy. 2. Similar presumed inferior right upper lobe atelectasis. 3. Small right middle and left upper lobe pulmonary nodules. The right middle lobe nodule appears more distinct today, possibly due to differences in slice selection. Left upper lobe pulmonary nodule measures slightly larger today but was present in 2014. [place] unlikely to represent metastatic disease. Recommend attention on follow-up to exclude unlikely indolent metachronous lesion. 4.  Atherosclerosis, including within the coronary arteries. 5. Esophageal dilatation, suggesting dysmotility. CT ABDOMEN AND PELVIS IMPRESSION 1. Hepatic metastasis. Difficult to directly compare to the prior exam, secondary to differences in bolus timing. Felt to be similar to slightly improved. 2. Similar infrarenal abdominal aortic aneurysm. 3. similar pancreatic tail lesion. Considerations include an area of chronic pancreatitis, metastatic lesion, or less likely indolent primary pancreatic neoplasm. Electronically Signed   By: Abigail Miyamoto M.D.    On: 07/28/2015 10:22   Ct Abdomen Pelvis W Contrast  07/28/2015  CLINICAL DATA:  Restaging of supraglottic squamous cell carcinoma in 2008. Metastatic non-small-cell lung cancer in 2011. Liver metastasis in 2014. EXAM: CT CHEST, ABDOMEN, AND PELVIS WITH CONTRAST TECHNIQUE: Multidetector CT imaging of the chest, abdomen and pelvis was performed following the standard protocol during bolus administration of intravenous contrast. CONTRAST:  170m OMNIPAQUE IOHEXOL 300 MG/ML  SOLN COMPARISON:  04/27/2015 FINDINGS: CT CHEST FINDINGS Mediastinum/Nodes: Tracheostomy. Tortuous, atherosclerotic thoracic aorta. Mild cardiomegaly with multivessel coronary artery atherosclerosis. No central pulmonary embolism, on this non-dedicated study. No mediastinal or hilar adenopathy. Esophageal dilatation is moderate. Lungs/Pleura: No pleural fluid.  Moderate centrilobular emphysema. Similar configuration of inferior right upper lobe opacity, again favoring atelectasis. Example image 21/series 4. 3 mm right middle lobe pulmonary nodule on image 25/series 4 was present on the prior exam but appears slightly more conspicuous today. Posterior left upper lobe pulmonary nodule measures 7 mm on image 15/series 4 versus 5 mm on the prior. This was present back on 08/02/2012 at 4 mm. Musculoskeletal: Posterior lateral left-sided  rib deformities are similar and likely due to remote trauma. No suspicious osseous lesion. CT ABDOMEN PELVIS FINDINGS Hepatobiliary: Extensive hepatic metastasis. Difficulty in comparing to the prior exam, secondary to differences in bolus timing today. Index anterior right hepatic lobe lesion measures 3.5 cm on image 52/series 2. Compare similar on the prior exam (when remeasured). More inferior right hepatic lobe index lesion measures 3.4 x 3.4 cm on image 55/series 2. 3.5 x 4.0 cm on the prior. anterior left hepatic lobe lesion measures 2.1 cm on image 49/series 2. Compare 2.4 cm on the prior exam (when  remeasured). Normal gallbladder, without biliary ductal dilatation. Pancreas: Pancreatic tail partially calcified lesion again identified. 3.6 x 3.2 cm on image 56/series 2. Compare 3.3 x 3.3 cm on the prior. Felt to be similar. No acute pancreatitis or duct dilatation. Spleen: Normal in size, without focal abnormality. Adrenals/Urinary Tract: Normal adrenal glands. Mild renal cortical thinning bilaterally. Lower pole left renal cyst. No hydronephrosis. Normal urinary bladder. Stomach/Bowel: Normal stomach, without wall thickening. Scattered colonic diverticula. Normal terminal ileum and appendix. Normal small bowel. Vascular/Lymphatic: Aortic and branch vessel atherosclerosis. Infrarenal abdominal aortic aneurysm. Example 4.2 by 4.2 cm today versus 4.5 x 4.3 cm on the prior. Similar. No extension into the iliacs. No surrounding hemorrhage. No abdominopelvic adenopathy. Reproductive: Normal prostate. Other: No significant free fluid. Musculoskeletal: No acute osseous abnormality. IMPRESSION: CT CHEST IMPRESSION 1. No evidence of thoracic adenopathy. 2. Similar presumed inferior right upper lobe atelectasis. 3. Small right middle and left upper lobe pulmonary nodules. The right middle lobe nodule appears more distinct today, possibly due to differences in slice selection. Left upper lobe pulmonary nodule measures slightly larger today but was present in 2014. [place] unlikely to represent metastatic disease. Recommend attention on follow-up to exclude unlikely indolent metachronous lesion. 4.  Atherosclerosis, including within the coronary arteries. 5. Esophageal dilatation, suggesting dysmotility. CT ABDOMEN AND PELVIS IMPRESSION 1. Hepatic metastasis. Difficult to directly compare to the prior exam, secondary to differences in bolus timing. Felt to be similar to slightly improved. 2. Similar infrarenal abdominal aortic aneurysm. 3. similar pancreatic tail lesion. Considerations include an area of chronic pancreatitis,  metastatic lesion, or less likely indolent primary pancreatic neoplasm. Electronically Signed   By: Abigail Miyamoto M.D.   On: 07/28/2015 10:22    ASSESSMENT AND PLAN:  This is a very pleasant 75 years old African-American male with metastatic non-small cell lung cancer , squamous cell carcinoma completed systemic chemotherapy with carboplatin and paclitaxel status post 6 cycles.  The patient is currently on observation and has no specific complaints today. The recent CT scan of the chest, abdomen and pelvis showed no evidence for disease progression. I discussed the scan results with the patient today.  I recommended for him to continue on observation with repeat CT scan of the chest, abdomen and pelvis in 3 months for restaging of his disease.  He was advised to call immediately if he has any concerning symptoms in the interval. The patient voices understanding of current disease status and treatment options and is in agreement with the current care plan.  All questions were answered. The patient knows to call the clinic with any problems, questions or concerns. We can certainly see the patient much sooner if necessary.  Disclaimer: This note was dictated with voice recognition software. Similar sounding words can inadvertently be transcribed and may not be corrected upon review.

## 2015-08-04 NOTE — Telephone Encounter (Signed)
Pt confirmed labs/ov per 02/22 POF, gave pt AVS and Calendar...Marland KitchenMarland KitchenCherylann Banas, pt's wife will p/u contrast later

## 2015-09-14 ENCOUNTER — Encounter: Payer: Self-pay | Admitting: Internal Medicine

## 2015-09-14 ENCOUNTER — Other Ambulatory Visit: Payer: Self-pay | Admitting: *Deleted

## 2015-09-14 DIAGNOSIS — E785 Hyperlipidemia, unspecified: Secondary | ICD-10-CM

## 2015-09-14 DIAGNOSIS — C329 Malignant neoplasm of larynx, unspecified: Secondary | ICD-10-CM

## 2015-09-14 DIAGNOSIS — K769 Liver disease, unspecified: Secondary | ICD-10-CM

## 2015-09-14 MED ORDER — SIMVASTATIN 20 MG PO TABS: 20.0000 mg | ORAL_TABLET | Freq: Every evening | ORAL | Status: DC

## 2015-09-14 NOTE — Progress Notes (Signed)
Form for garnet faxed 12/24/14 I sent to medical recrds

## 2015-09-14 NOTE — Telephone Encounter (Signed)
Received call pt states he need reffill on his cholesterol med "Simvastatin" [facility]. Inform pt will send script to mail order...[name]

## 2015-10-14 ENCOUNTER — Telehealth: Payer: Self-pay | Admitting: *Deleted

## 2015-10-14 MED ORDER — TADALAFIL 5 MG PO TABS: 5.0000 mg | ORAL_TABLET | Freq: Every day | ORAL | Status: DC | PRN

## 2015-10-14 NOTE — Telephone Encounter (Signed)
Received call pt states Dr. Karlton Lemon had given him rx for cialis wanting to get rx sent to his mail service [facility] [phone] or [phone]. Ref # [id number]. Verified msg inform pt will send to pharmacy...Johny Chess

## 2015-10-26 ENCOUNTER — Encounter (HOSPITAL_COMMUNITY): Payer: Self-pay

## 2015-10-26 ENCOUNTER — Other Ambulatory Visit (HOSPITAL_BASED_OUTPATIENT_CLINIC_OR_DEPARTMENT_OTHER): Payer: Medicare Other

## 2015-10-26 ENCOUNTER — Other Ambulatory Visit: Payer: Medicare Other

## 2015-10-26 ENCOUNTER — Ambulatory Visit (HOSPITAL_COMMUNITY)
Admission: RE | Admit: 2015-10-26 | Discharge: 2015-10-26 | Disposition: A | Discharge status: Home / Self Care | Payer: Medicare Other | Source: Ambulatory Visit | Attending: Internal Medicine | Admitting: Internal Medicine

## 2015-10-26 DIAGNOSIS — K869 Disease of pancreas, unspecified: Secondary | ICD-10-CM | POA: Diagnosis not present

## 2015-10-26 DIAGNOSIS — I2510 Atherosclerotic heart disease of native coronary artery without angina pectoris: Secondary | ICD-10-CM | POA: Diagnosis not present

## 2015-10-26 DIAGNOSIS — R918 Other nonspecific abnormal finding of lung field: Secondary | ICD-10-CM | POA: Insufficient documentation

## 2015-10-26 DIAGNOSIS — C3411 Malignant neoplasm of upper lobe, right bronchus or lung: Secondary | ICD-10-CM | POA: Diagnosis not present

## 2015-10-26 DIAGNOSIS — C787 Secondary malignant neoplasm of liver and intrahepatic bile duct: Secondary | ICD-10-CM | POA: Diagnosis not present

## 2015-10-26 LAB — COMPREHENSIVE METABOLIC PANEL
ALT: 13 U/L (ref 0–55)
ANION GAP: 9 meq/L (ref 3–11)
AST: 24 U/L (ref 5–34)
Albumin: 3.7 g/dL (ref 3.5–5.0)
Alkaline Phosphatase: 108 U/L (ref 40–150)
BILIRUBIN TOTAL: 0.37 mg/dL (ref 0.20–1.20)
BUN: 12.2 mg/dL (ref 7.0–26.0)
CO2: 25 meq/L (ref 22–29)
CREATININE: 1 mg/dL (ref 0.7–1.3)
Calcium: 9.4 mg/dL (ref 8.4–10.4)
Chloride: 105 mEq/L (ref 98–109)
EGFR: 89 mL/min/{1.73_m2} — ABNORMAL LOW (ref >90)
Glucose: 141 mg/dl — ABNORMAL HIGH (ref 70–140)
Potassium: 3.6 mEq/L (ref 3.5–5.1)
Sodium: 139 mEq/L (ref 136–145)
TOTAL PROTEIN: 7.5 g/dL (ref 6.4–8.3)

## 2015-10-26 LAB — CBC WITH DIFFERENTIAL/PLATELET
BASO%: 0.5 % (ref 0.0–2.0)
BASOS ABS: 0 10*3/uL (ref 0.0–0.1)
EOS ABS: 0.1 10*3/uL (ref 0.0–0.5)
EOS%: 1 % (ref 0.0–7.0)
HCT: 44.8 % (ref 38.4–49.9)
HEMOGLOBIN: 14.3 g/dL (ref 13.0–17.1)
LYMPH%: 9.4 % — AB (ref 14.0–49.0)
MCH: 25.7 pg — AB (ref 27.2–33.4)
MCHC: 31.9 g/dL — AB (ref 32.0–36.0)
MCV: 80.7 fL (ref 79.3–98.0)
MONO#: 0.4 10*3/uL (ref 0.1–0.9)
MONO%: 4.7 % (ref 0.0–14.0)
NEUT#: 6.6 10*3/uL — ABNORMAL HIGH (ref 1.5–6.5)
NEUT%: 84.4 % — AB (ref 39.0–75.0)
Platelets: 186 10*3/uL (ref 140–400)
RBC: 5.56 10*6/uL (ref 4.20–5.82)
RDW: 17.5 % — AB (ref 11.0–14.6)
WBC: 7.8 10*3/uL (ref 4.0–10.3)
lymph#: 0.7 10*3/uL — ABNORMAL LOW (ref 0.9–3.3)

## 2015-10-26 MED ORDER — IOPAMIDOL (ISOVUE-300) INJECTION 61%
100.0000 mL | Freq: Once | INTRAVENOUS | Status: AC | PRN
  Administered 2015-10-26: 100 mL via INTRAVENOUS

## 2015-11-02 ENCOUNTER — Telehealth: Payer: Self-pay | Admitting: Internal Medicine

## 2015-11-02 ENCOUNTER — Encounter: Payer: Self-pay | Admitting: Internal Medicine

## 2015-11-02 ENCOUNTER — Encounter: Payer: Self-pay | Admitting: *Deleted

## 2015-11-02 ENCOUNTER — Ambulatory Visit (HOSPITAL_BASED_OUTPATIENT_CLINIC_OR_DEPARTMENT_OTHER): Payer: Medicare Other | Admitting: Internal Medicine

## 2015-11-02 VITALS — BP 153/89 | HR 90 | Temp 98.2°F | Resp 17 | Ht 66.5 in | Wt 154.4 lb

## 2015-11-02 DIAGNOSIS — C3411 Malignant neoplasm of upper lobe, right bronchus or lung: Secondary | ICD-10-CM | POA: Diagnosis not present

## 2015-11-02 DIAGNOSIS — C787 Secondary malignant neoplasm of liver and intrahepatic bile duct: Secondary | ICD-10-CM | POA: Diagnosis not present

## 2015-11-02 NOTE — Progress Notes (Signed)
[facility] [phone]   Fax:([phone]) [place], [place] [phone]  PRINCIPAL DIAGNOSES:  1. 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 2.  metastatic non-small cell lung cancer initially diagnosed as Stage IA non-small-cell lung cancer diagnosed in June 2011.  PRIOR THERAPY:  1. Status post total laryngectomy with. Bilateral selective lymph node dissection as well as left superior parotidectomy with nerve dissection under the care of Dr. Redmond Baseman with positive resection margin. 2. Status post course of concurrent chemoradiation with weekly cisplatin. Last dose of chemotherapy was given January 08, 2007. 3. Status post curative radiotherapy with tomotherapy to the lung lesions completed December 31, 2009 under the care of Dr. Pablo Ledger. 4. Systemic chemotherapy with carboplatin for an AUC of 5 and paclitaxel 175 mg meter squared. Status post 6 cycles.   CURRENT THERAPY: Observation.  INTERVAL HISTORY: [name] 75 y.o. male returns to the clinic today for follow-up visit accompanied by his wife [name]. The patient is feeling fine today with no specific complaints. He has been observation for the last 6 months and doing very well. He denied having any significant nausea or vomiting, no fever or chills. He denied having any significant chest pain, shortness of breath, cough or hemoptysis. The patient had repeat CT scan of the chest, abdomen and pelvis performed recently and he is here for evaluation and discussion of his scan results.   MEDICAL HISTORY: Past Medical History  Diagnosis Date  . Hypertension   . Stroke [facility] [place]) 2002    left side weakness  . Cancer ([place]) 2009    SUPERGLOTTIS INVASIVE SQ CELL CA  . Full code status 03/17/2015    ALLERGIES:  has No Known Allergies.  MEDICATIONS:  Current Outpatient Prescriptions  Medication Sig  Dispense Refill  . albuterol (PROVENTIL) (2.5 MG/3ML) 0.083% nebulizer solution Take 2.5 mg by nebulization every 6 (six) hours as needed for wheezing or shortness of breath.    Marland Kitchen amLODipine (NORVASC) 5 MG tablet Take 1 tablet (5 mg total) by mouth at bedtime. 90 tablet 3  . aspirin 81 MG tablet Take 81 mg by mouth every morning.     . baclofen (LIORESAL) 10 MG tablet Take 1 tablet (10 mg total) by mouth at bedtime. 90 each 3  . cholecalciferol (VITAMIN D) 1000 UNITS tablet Take 1,000 Units by mouth every morning.     . diphenhydrAMINE (BENADRYL) 25 MG tablet Take 50 mg by mouth every 8 (eight) hours as needed.    . finasteride (PROSCAR) 5 MG tablet Take 1 tablet (5 mg total) by mouth every morning. 90 tablet 3  . loratadine (CLARITIN) 10 MG tablet Take 10 mg by mouth daily.    . metoprolol succinate (TOPROL-XL) 25 MG 24 hr tablet Take 1 tablet (25 mg total) by mouth every morning. 90 tablet 3  . prochlorperazine (COMPAZINE) 10 MG tablet Take 1 tablet (10 mg total) by mouth every 6 (six) hours as needed for nausea or vomiting. 30 tablet 0  . simvastatin (ZOCOR) 20 MG tablet Take 1 tablet (20 mg total) by mouth every evening. 90 tablet 3  . tadalafil (CIALIS) 5 MG tablet Take 1 tablet (5 mg total) by mouth daily as needed for erectile dysfunction. 90 tablet 0  . tiZANidine (ZANAFLEX) 4 MG tablet Take 1 tablet (4 mg total) by mouth 3 (three) times daily as  needed for muscle spasms. 90 tablet 1   No current facility-administered medications for this visit.    SURGICAL HISTORY:  Past Surgical History  Procedure Laterality Date  . Tracheostomy  2009    REVIEW OF SYSTEMS:  A comprehensive review of systems was negative.   PHYSICAL EXAMINATION: General appearance: alert, cooperative, fatigued and no distress Head: Normocephalic, without obvious abnormality, atraumatic Neck: no adenopathy, no JVD, supple, symmetrical, trachea midline and thyroid not enlarged, symmetric, no  tenderness/mass/nodules Lymph nodes: Cervical, supraclavicular, and axillary nodes normal. Resp: clear to auscultation bilaterally Back: symmetric, no curvature. ROM normal. No CVA tenderness. Cardio: regular rate and rhythm, S1, S2 normal, no murmur, click, rub or gallop GI: soft, non-tender; bowel sounds normal; no masses,  no organomegaly Extremities: extremities normal, atraumatic, no cyanosis or edema Neurologic: Alert and oriented X 3, normal strength and tone. Normal symmetric reflexes. Normal coordination and gait  ECOG PERFORMANCE STATUS: 1 - Symptomatic but completely ambulatory  Blood pressure 153/89, pulse 90, temperature 98.2 F (36.8 C), temperature source Oral, resp. rate 17, height 5' 6.5" (1.689 m), weight 154 lb 6.4 oz (70.035 kg), SpO2 93 %.  LABORATORY DATA: Lab Results  Component Value Date   WBC 7.8 10/26/2015   HGB 14.3 10/26/2015   HCT 44.8 10/26/2015   MCV 80.7 10/26/2015   PLT 186 10/26/2015      Chemistry      Component Value Date/Time   NA 139 10/26/2015 1109   NA 139 01/19/2012 0942   NA 141 03/31/2010 1005   K 3.6 10/26/2015 1109   K 4.1 01/19/2012 0942   K 3.7 03/31/2010 1005   CL 103 08/02/2012 1107   CL 96* 01/19/2012 0942   CL 105 03/31/2010 1005   CO2 25 10/26/2015 1109   CO2 28 01/19/2012 0942   CO2 31 03/31/2010 1005   BUN 12.2 10/26/2015 1109   BUN 14 01/19/2012 0942   BUN 12 03/31/2010 1005   CREATININE 1.0 10/26/2015 1109   CREATININE 1.2 01/19/2012 0942   CREATININE 1.02 03/31/2010 1005      Component Value Date/Time   CALCIUM 9.4 10/26/2015 1109   CALCIUM 9.5 01/19/2012 0942   CALCIUM 9.0 03/31/2010 1005   ALKPHOS 108 10/26/2015 1109   ALKPHOS 64 01/19/2012 0942   ALKPHOS 60 03/31/2010 1005   AST 24 10/26/2015 1109   AST 27 01/19/2012 0942   AST 21 03/31/2010 1005   ALT 13 10/26/2015 1109   ALT 23 01/19/2012 0942   ALT 19 03/31/2010 1005   BILITOT 0.37 10/26/2015 1109   BILITOT 0.50 01/19/2012 0942   BILITOT 0.4  03/31/2010 1005       RADIOGRAPHIC STUDIES: Ct Chest W Contrast  10/26/2015  CLINICAL DATA:  75 year old male with history of supraglottic invasive squamous cell carcinoma diagnosed in 2008 with known metastatic disease to the liver. Chemotherapy completed in February 2017. EXAM: CT CHEST, ABDOMEN, AND PELVIS WITH CONTRAST TECHNIQUE: Multidetector CT imaging of the chest, abdomen and pelvis was performed following the standard protocol during bolus administration of intravenous contrast. CONTRAST:  146m ISOVUE-300 IOPAMIDOL (ISOVUE-300) INJECTION 61% COMPARISON:  CT of the chest, abdomen and pelvis 07/28/2015 and multiple more remote prior examinations. FINDINGS: CT CHEST FINDINGS Mediastinum/Lymph Nodes: Heart size is normal. There is no significant pericardial fluid, thickening or pericardial calcification. There is atherosclerosis of the thoracic aorta, the great vessels of the mediastinum and the coronary arteries, including calcified atherosclerotic plaque in the left main, left anterior descending, left circumflex and  right coronary arteries. Severe thickening and calcification of the aortic valve. Hypertrophy of the left ventricle, most notable in the interventricular septum (approximately 21 mm in thickness). No pathologically enlarged mediastinal or hilar lymph nodes. Patulous esophagus. Status post laryngectomy. No axillary lymphadenopathy. Lungs/Pleura: In the inferior aspect of the right upper lobe abutting the minor fissure there is again a masslike opacity on axial images which appears more plate like on sagittal and coronal reconstructions. This measures approximately 2.7 x 2.0 x 1.1 cm on today's study (axial image 44 of series 5 and sagittal image forty-eight of series 603), very similar in appearance dating back to prior studies from 02/23/2015. There is some adjacent pleural thickening anteriorly and some regional architectural distortion. 7 mm nodule in the posterior aspect of the left  upper lobe (image 27 of series 5) is unchanged. Partial collapse of the trachea and mainstem bronchi, indicative of tracheobronchomalacia. No acute consolidative airspace disease. No pleural effusions. Subtle ground-glass attenuation and septal thickening in the apices of the lungs bilaterally, from prior radiation therapy. Musculoskeletal/Soft Tissues: Multiple healed left-sided rib fractures. There are no aggressive appearing lytic or blastic lesions noted in the visualized portions of the skeleton. CT ABDOMEN AND PELVIS FINDINGS Hepatobiliary: Again noted are numerous hypovascular hepatic metastases. While some of these lesions appear stable compared to the prior study (example is a 3.5 cm lesion in the right lobe of the liver on image 55 of series 2), other lesions have clearly enlarged, including the previously noted index lesion in the right lobe of the liver (image 57 of series 2) which currently measures 4.8 x 4.1 cm (previously 3.4 x 3.4 cm. Medial to this lesion predominantly within segment 6 of the liver there is a large 6.0 x 4.2 cm lesion (image 56 of series 2) which previously measured only 5.6 x 3.3 cm. No definite new hepatic lesions are noted. No intra or extrahepatic biliary ductal dilatation. Gallbladder is unremarkable in appearance. Pancreas: 3.7 x 3.0 cm hypovascular mass in the tail of the pancreas with a few coarse internal calcifications appears generally similar to the prior study, extending toward the splenic hilum. The remainder of the pancreas is otherwise normal in appearance. No pancreatic ductal dilatation. No peripancreatic inflammatory changes. Spleen: Unremarkable. Adrenals/Urinary Tract: Bilateral adrenal glands are normal in appearance. 2.2 cm simple cyst in the lower pole of the left kidney. Sub cm low-attenuation lesions in the right kidney are too small to definitively characterize, but are also favored to represent small cysts. No hydroureteronephrosis. Urinary bladder wall  appears thickened, most pronounced anteriorly and on the right side. Some of this may be related to under distention of the urinary bladder. Stomach/Bowel: Normal appearance of the stomach. No pathologic dilatation of small bowel or colon. Normal appendix. Vascular/Lymphatic: Extensive atherosclerosis throughout the abdominal and pelvic vasculature, including a large infrarenal abdominal aortic aneurysm which is unchanged compared to the prior study measuring 4.2 x 4.3 cm on today's examination terminating immediately above the level of the aortic bifurcation. No lymphadenopathy noted in the abdomen or pelvis. Reproductive: Prostate gland and seminal vesicles are unremarkable in appearance. Other: No significant volume of free fluid.  No pneumoperitoneum. Musculoskeletal: There are no aggressive appearing lytic or blastic lesions noted in the visualized portions of the skeleton. IMPRESSION: 1. Today's study demonstrates stable size of some hepatic lesions, while other lesions have clearly enlarged, indicative of slight progression of disease. No definite new hepatic lesions are identified. 2. Unusual opacity in the inferior aspect of the  right upper lobe abutting the minor fissure which has been generally stable in size and appearance compared to prior studies dating back to 02/23/2015. Based on the behavior of this lesion, its favored to represent an area of scarring, however, the appearance on axial images in particular is concerning for neoplasm. Continued attention on followup studies is recommended. The other 7 mm left upper lobe nodule is stable in size. 3. Hypovascular lesion in the tail of the pancreas is unchanged compared to the recent prior examination. Differential considerations include an area of chronic (autoimmune) pancreatitis, metastatic lesion or less likely a primary pancreatic lesion. 4. There are very severe calcifications of the aortic valve. Echocardiographic correlation for evaluation of  potential valvular dysfunction may be warranted if clinically indicated. 5. Atherosclerosis, including left main and 3 vessel coronary artery disease. In addition, there is an infrarenal abdominal aortic aneurysm which is similar in size to the prior study measuring 4.2 x 4.3 cm on today's examination. Recommend followup by ultrasound in 1 year. This recommendation follows ACR consensus guidelines: White Paper of the ACR Incidental Findings Committee II on Vascular Findings. J Am Coll Radiol 2013; 10:789-794. 6. Additional incidental findings, as above. Electronically Signed   By: Vinnie Langton M.D.   On: 10/26/2015 17:30   Ct Abdomen Pelvis W Contrast  10/26/2015  CLINICAL DATA:  75 year old male with history of supraglottic invasive squamous cell carcinoma diagnosed in 2008 with known metastatic disease to the liver. Chemotherapy completed in February 2017. EXAM: CT CHEST, ABDOMEN, AND PELVIS WITH CONTRAST TECHNIQUE: Multidetector CT imaging of the chest, abdomen and pelvis was performed following the standard protocol during bolus administration of intravenous contrast. CONTRAST:  183m ISOVUE-300 IOPAMIDOL (ISOVUE-300) INJECTION 61% COMPARISON:  CT of the chest, abdomen and pelvis 07/28/2015 and multiple more remote prior examinations. FINDINGS: CT CHEST FINDINGS Mediastinum/Lymph Nodes: Heart size is normal. There is no significant pericardial fluid, thickening or pericardial calcification. There is atherosclerosis of the thoracic aorta, the great vessels of the mediastinum and the coronary arteries, including calcified atherosclerotic plaque in the left main, left anterior descending, left circumflex and right coronary arteries. Severe thickening and calcification of the aortic valve. Hypertrophy of the left ventricle, most notable in the interventricular septum (approximately 21 mm in thickness). No pathologically enlarged mediastinal or hilar lymph nodes. Patulous esophagus. Status post laryngectomy.  No axillary lymphadenopathy. Lungs/Pleura: In the inferior aspect of the right upper lobe abutting the minor fissure there is again a masslike opacity on axial images which appears more plate like on sagittal and coronal reconstructions. This measures approximately 2.7 x 2.0 x 1.1 cm on today's study (axial image 44 of series 5 and sagittal image forty-eight of series 603), very similar in appearance dating back to prior studies from 02/23/2015. There is some adjacent pleural thickening anteriorly and some regional architectural distortion. 7 mm nodule in the posterior aspect of the left upper lobe (image 27 of series 5) is unchanged. Partial collapse of the trachea and mainstem bronchi, indicative of tracheobronchomalacia. No acute consolidative airspace disease. No pleural effusions. Subtle ground-glass attenuation and septal thickening in the apices of the lungs bilaterally, from prior radiation therapy. Musculoskeletal/Soft Tissues: Multiple healed left-sided rib fractures. There are no aggressive appearing lytic or blastic lesions noted in the visualized portions of the skeleton. CT ABDOMEN AND PELVIS FINDINGS Hepatobiliary: Again noted are numerous hypovascular hepatic metastases. While some of these lesions appear stable compared to the prior study (example is a 3.5 cm lesion in the right lobe  of the liver on image 55 of series 2), other lesions have clearly enlarged, including the previously noted index lesion in the right lobe of the liver (image 57 of series 2) which currently measures 4.8 x 4.1 cm (previously 3.4 x 3.4 cm. Medial to this lesion predominantly within segment 6 of the liver there is a large 6.0 x 4.2 cm lesion (image 56 of series 2) which previously measured only 5.6 x 3.3 cm. No definite new hepatic lesions are noted. No intra or extrahepatic biliary ductal dilatation. Gallbladder is unremarkable in appearance. Pancreas: 3.7 x 3.0 cm hypovascular mass in the tail of the pancreas with a few  coarse internal calcifications appears generally similar to the prior study, extending toward the splenic hilum. The remainder of the pancreas is otherwise normal in appearance. No pancreatic ductal dilatation. No peripancreatic inflammatory changes. Spleen: Unremarkable. Adrenals/Urinary Tract: Bilateral adrenal glands are normal in appearance. 2.2 cm simple cyst in the lower pole of the left kidney. Sub cm low-attenuation lesions in the right kidney are too small to definitively characterize, but are also favored to represent small cysts. No hydroureteronephrosis. Urinary bladder wall appears thickened, most pronounced anteriorly and on the right side. Some of this may be related to under distention of the urinary bladder. Stomach/Bowel: Normal appearance of the stomach. No pathologic dilatation of small bowel or colon. Normal appendix. Vascular/Lymphatic: Extensive atherosclerosis throughout the abdominal and pelvic vasculature, including a large infrarenal abdominal aortic aneurysm which is unchanged compared to the prior study measuring 4.2 x 4.3 cm on today's examination terminating immediately above the level of the aortic bifurcation. No lymphadenopathy noted in the abdomen or pelvis. Reproductive: Prostate gland and seminal vesicles are unremarkable in appearance. Other: No significant volume of free fluid.  No pneumoperitoneum. Musculoskeletal: There are no aggressive appearing lytic or blastic lesions noted in the visualized portions of the skeleton. IMPRESSION: 1. Today's study demonstrates stable size of some hepatic lesions, while other lesions have clearly enlarged, indicative of slight progression of disease. No definite new hepatic lesions are identified. 2. Unusual opacity in the inferior aspect of the right upper lobe abutting the minor fissure which has been generally stable in size and appearance compared to prior studies dating back to 02/23/2015. Based on the behavior of this lesion, its  favored to represent an area of scarring, however, the appearance on axial images in particular is concerning for neoplasm. Continued attention on followup studies is recommended. The other 7 mm left upper lobe nodule is stable in size. 3. Hypovascular lesion in the tail of the pancreas is unchanged compared to the recent prior examination. Differential considerations include an area of chronic (autoimmune) pancreatitis, metastatic lesion or less likely a primary pancreatic lesion. 4. There are very severe calcifications of the aortic valve. Echocardiographic correlation for evaluation of potential valvular dysfunction may be warranted if clinically indicated. 5. Atherosclerosis, including left main and 3 vessel coronary artery disease. In addition, there is an infrarenal abdominal aortic aneurysm which is similar in size to the prior study measuring 4.2 x 4.3 cm on today's examination. Recommend followup by ultrasound in 1 year. This recommendation follows ACR consensus guidelines: White Paper of the ACR Incidental Findings Committee II on Vascular Findings. J Am Coll Radiol 2013; 10:789-794. 6. Additional incidental findings, as above. Electronically Signed   By: Vinnie Langton M.D.   On: 10/26/2015 17:30    ASSESSMENT AND PLAN:  This is a very pleasant 75 years old African-American male with metastatic non-small cell  lung cancer , squamous cell carcinoma completed systemic chemotherapy with carboplatin and paclitaxel status post 6 cycles.  The patient has been on observation for the last 6 months and has no specific complaints today. The recent CT scan of the chest, abdomen and pelvis showed no evidence for disease progression. I discussed the scan results with the patient today.  I recommended for him to continue on observation with repeat CT scan of the chest, abdomen and pelvis in 3 months for restaging of his disease.  He was advised to call immediately if he has any concerning symptoms in the  interval. The patient voices understanding of current disease status and treatment options and is in agreement with the current care plan.  All questions were answered. The patient knows to call the clinic with any problems, questions or concerns. We can certainly see the patient much sooner if necessary.  Disclaimer: This note was dictated with voice recognition software. Similar sounding words can inadvertently be transcribed and may not be corrected upon review.

## 2015-11-02 NOTE — Progress Notes (Signed)
Oncology Nurse Navigator Documentation  Oncology Nurse Navigator Flowsheets 11/02/2015  Navigator Encounter Type Clinic/MDC  Patient Visit Type MedOnc  Treatment Phase Other  Barriers/Navigation Needs No barriers at this time  Interventions None required  Acuity Level 1  Time Spent with Patient 22   Spoke with patient and wife today at cancer center.  Patient is on observation and no needs identified.

## 2015-11-02 NOTE — Telephone Encounter (Signed)
Gave and pritned appt sched and avs for pt for Aug...gv barium

## 2015-12-13 ENCOUNTER — Other Ambulatory Visit: Payer: Self-pay | Admitting: Geriatric Medicine

## 2015-12-13 MED ORDER — ALBUTEROL SULFATE (2.5 MG/3ML) 0.083% IN NEBU: 2.5000 mg | INHALATION_SOLUTION | Freq: Four times a day (QID) | RESPIRATORY_TRACT | Status: DC | PRN

## 2015-12-13 MED FILL — ALBUTEROL 0.083 MG/ML SOLN: (2.5 MG/3ML | 7 days supply | Qty: 90 | Fill #0

## 2015-12-16 ENCOUNTER — Telehealth: Payer: Self-pay | Admitting: Emergency Medicine

## 2015-12-16 ENCOUNTER — Other Ambulatory Visit: Payer: Self-pay | Admitting: Geriatric Medicine

## 2015-12-16 MED ORDER — ALBUTEROL SULFATE (2.5 MG/3ML) 0.083% IN NEBU: 2.5000 mg | INHALATION_SOLUTION | Freq: Four times a day (QID) | RESPIRATORY_TRACT | Status: DC | PRN

## 2015-12-16 NOTE — Telephone Encounter (Signed)
Patients wife called. Pt only received a weeks worth of  albuterol (PROVENTIL) (2.5 MG/3ML) 0.083% nebulizer solution. He uses the treatment 4 times a day. Needs a refill please. Thanks.

## 2015-12-16 NOTE — Telephone Encounter (Signed)
Sent to pharmacy 

## 2015-12-20 MED FILL — ALBUTEROL 0.083 MG/ML SOLN: (2.5 MG/3ML | 7 days supply | Qty: 90 | Fill #1

## 2015-12-30 ENCOUNTER — Ambulatory Visit: Payer: Medicare Other | Admitting: Internal Medicine

## 2016-01-11 ENCOUNTER — Encounter: Payer: Self-pay | Admitting: Internal Medicine

## 2016-01-11 ENCOUNTER — Ambulatory Visit (INDEPENDENT_AMBULATORY_CARE_PROVIDER_SITE_OTHER): Payer: Medicare Other | Admitting: Internal Medicine

## 2016-01-11 DIAGNOSIS — I10 Essential (primary) hypertension: Secondary | ICD-10-CM | POA: Diagnosis not present

## 2016-01-11 DIAGNOSIS — K7689 Other specified diseases of liver: Secondary | ICD-10-CM

## 2016-01-11 DIAGNOSIS — R269 Unspecified abnormalities of gait and mobility: Secondary | ICD-10-CM

## 2016-01-11 DIAGNOSIS — Z8673 Personal history of transient ischemic attack (TIA), and cerebral infarction without residual deficits: Secondary | ICD-10-CM

## 2016-01-11 DIAGNOSIS — K769 Liver disease, unspecified: Secondary | ICD-10-CM

## 2016-01-11 DIAGNOSIS — C3411 Malignant neoplasm of upper lobe, right bronchus or lung: Secondary | ICD-10-CM

## 2016-01-11 DIAGNOSIS — C329 Malignant neoplasm of larynx, unspecified: Secondary | ICD-10-CM | POA: Diagnosis not present

## 2016-01-11 MED ORDER — TADALAFIL 5 MG PO TABS: 5.0000 mg | ORAL_TABLET | Freq: Every day | ORAL | 3 refills | Status: DC

## 2016-01-11 MED ORDER — METOPROLOL SUCCINATE ER 25 MG PO TB24: 25.0000 mg | ORAL_TABLET | Freq: Every morning | ORAL | 3 refills | Status: DC

## 2016-01-11 MED ORDER — TADALAFIL 5 MG PO TABS: 5.0000 mg | ORAL_TABLET | Freq: Every day | ORAL | 0 refills | Status: DC | PRN

## 2016-01-11 MED ORDER — ALBUTEROL SULFATE (2.5 MG/3ML) 0.083% IN NEBU: 2.5000 mg | INHALATION_SOLUTION | Freq: Four times a day (QID) | RESPIRATORY_TRACT | 5 refills | Status: DC | PRN

## 2016-01-11 MED ORDER — FINASTERIDE 5 MG PO TABS: 5.0000 mg | ORAL_TABLET | Freq: Every morning | ORAL | 3 refills | Status: DC

## 2016-01-11 MED ORDER — BACLOFEN 10 MG PO TABS: 10.0000 mg | ORAL_TABLET | Freq: Every day | ORAL | 3 refills | Status: DC

## 2016-01-11 MED ORDER — AMLODIPINE BESYLATE 5 MG PO TABS: 5.0000 mg | ORAL_TABLET | Freq: Every day | ORAL | 3 refills | Status: DC

## 2016-01-11 NOTE — Progress Notes (Signed)
   Subjective:    Patient ID: [name], male    DOB: 08-09-1940, 75 y.o.   MRN: [id number]  HPI The patient is a 75 YO man coming in for follow up of his medical conditions including his blood pressure. He is still taking his medicines as prescribed but did not take this morning. He is having good blood pressure readings at home.   Review of Systems  Constitutional: Negative for activity change, appetite change, fatigue, fever and unexpected weight change.  Respiratory: Negative for cough, chest tightness, shortness of breath and wheezing.   Cardiovascular: Negative for chest pain, palpitations and leg swelling.  Gastrointestinal: Negative for abdominal distention, abdominal pain, constipation and diarrhea.  Musculoskeletal: Positive for arthralgias and gait problem.  Skin: Negative.   Neurological: Positive for tremors, speech difficulty and weakness. Negative for dizziness and light-headedness.      Objective:   Physical Exam  Constitutional: He is oriented to person, place, and time. He appears well-developed and well-nourished.  HENT:  Head: Normocephalic and atraumatic.  Voice box in place, speaks through that.   Eyes: EOM are normal.  Neck: Normal range of motion. No JVD present.  Cardiovascular: Normal rate and regular rhythm.   Pulmonary/Chest: Effort normal. No respiratory distress. He has no wheezes.  Abdominal: Soft. He exhibits no distension. There is no tenderness. There is no rebound.  Musculoskeletal: He exhibits no edema.  Neurological: He is alert and oriented to person, place, and time. Coordination abnormal.  Left arm at side, not used. In wheelchair for long distances but can walk short distances at home.   Skin: Skin is warm and dry.  Psychiatric: He has a normal mood and affect.   Vitals:   01/11/16 0946  BP: (!) 160/102  Pulse: 87  Resp: 12  Temp: 98.8 F (37.1 C)  TempSrc: Oral  SpO2: 97%  Weight: 158 lb (71.7 kg)  Height: '5\' 6"'$  (1.676 m)     Assessment & Plan:

## 2016-01-11 NOTE — Patient Instructions (Signed)
We do not need any blood work today and have sent in the medicines today.   We have given you the cialis prescription since we could not find it in the computer to send to the pharmacy.

## 2016-01-11 NOTE — Progress Notes (Signed)
Pre visit review using our clinic review tool, if applicable. No additional management support is needed unless otherwise documented below in the visit note. 

## 2016-01-11 NOTE — Assessment & Plan Note (Signed)
Refilled his medications and BP are normal at home and at other visits. Will continue to monitor and if elevated can change regimen. Recent CMP without indication for change.

## 2016-01-11 NOTE — Assessment & Plan Note (Signed)
Currently stable with recent scan and getting another CT in about 1 month.

## 2016-01-18 MED FILL — ALBUTEROL 0.083 MG/ML SOLN: (2.5 MG/3ML | 30 days supply | Qty: 360 | Fill #0

## 2016-02-01 ENCOUNTER — Other Ambulatory Visit (HOSPITAL_BASED_OUTPATIENT_CLINIC_OR_DEPARTMENT_OTHER): Payer: Medicare Other

## 2016-02-01 ENCOUNTER — Ambulatory Visit (HOSPITAL_COMMUNITY)
Admission: RE | Admit: 2016-02-01 | Discharge: 2016-02-01 | Disposition: A | Discharge status: Home / Self Care | Payer: Medicare Other | Source: Ambulatory Visit | Attending: Internal Medicine | Admitting: Internal Medicine

## 2016-02-01 ENCOUNTER — Encounter (HOSPITAL_COMMUNITY): Payer: Self-pay

## 2016-02-01 DIAGNOSIS — K869 Disease of pancreas, unspecified: Secondary | ICD-10-CM | POA: Insufficient documentation

## 2016-02-01 DIAGNOSIS — C3411 Malignant neoplasm of upper lobe, right bronchus or lung: Secondary | ICD-10-CM

## 2016-02-01 DIAGNOSIS — C787 Secondary malignant neoplasm of liver and intrahepatic bile duct: Secondary | ICD-10-CM | POA: Diagnosis not present

## 2016-02-01 LAB — COMPREHENSIVE METABOLIC PANEL
ALT: 12 U/L (ref 0–55)
AST: 26 U/L (ref 5–34)
Albumin: 3.4 g/dL — ABNORMAL LOW (ref 3.5–5.0)
Alkaline Phosphatase: 124 U/L (ref 40–150)
Anion Gap: 9 mEq/L (ref 3–11)
BUN: 10.1 mg/dL (ref 7.0–26.0)
CALCIUM: 9.1 mg/dL (ref 8.4–10.4)
CHLORIDE: 105 meq/L (ref 98–109)
CO2: 25 mEq/L (ref 22–29)
Creatinine: 0.8 mg/dL (ref 0.7–1.3)
EGFR: >90 mL/min/{1.73_m2} (ref >90)
Glucose: 89 mg/dl (ref 70–140)
POTASSIUM: 3.8 meq/L (ref 3.5–5.1)
SODIUM: 139 meq/L (ref 136–145)
Total Bilirubin: 0.4 mg/dL (ref 0.20–1.20)
Total Protein: 7 g/dL (ref 6.4–8.3)

## 2016-02-01 LAB — CBC WITH DIFFERENTIAL/PLATELET
BASO%: 0.6 % (ref 0.0–2.0)
BASOS ABS: 0.1 10*3/uL (ref 0.0–0.1)
EOS%: 1.5 % (ref 0.0–7.0)
Eosinophils Absolute: 0.1 10*3/uL (ref 0.0–0.5)
HEMATOCRIT: 34.8 % — AB (ref 38.4–49.9)
HGB: 11.1 g/dL — ABNORMAL LOW (ref 13.0–17.1)
LYMPH%: 14 % (ref 14.0–49.0)
MCH: 23.7 pg — AB (ref 27.2–33.4)
MCHC: 31.9 g/dL — AB (ref 32.0–36.0)
MCV: 74.2 fL — ABNORMAL LOW (ref 79.3–98.0)
MONO#: 0.5 10*3/uL (ref 0.1–0.9)
MONO%: 6.2 % (ref 0.0–14.0)
NEUT#: 6.4 10*3/uL (ref 1.5–6.5)
NEUT%: 77.7 % — AB (ref 39.0–75.0)
Platelets: 187 10*3/uL (ref 140–400)
RBC: 4.69 10*6/uL (ref 4.20–5.82)
RDW: 16.4 % — ABNORMAL HIGH (ref 11.0–14.6)
WBC: 8.2 10*3/uL (ref 4.0–10.3)
lymph#: 1.2 10*3/uL (ref 0.9–3.3)

## 2016-02-01 MED ORDER — IOPAMIDOL (ISOVUE-300) INJECTION 61%
100.0000 mL | Freq: Once | INTRAVENOUS | Status: AC | PRN
  Administered 2016-02-01: 100 mL via INTRAVENOUS

## 2016-02-08 ENCOUNTER — Ambulatory Visit (HOSPITAL_BASED_OUTPATIENT_CLINIC_OR_DEPARTMENT_OTHER): Payer: Medicare Other | Admitting: Internal Medicine

## 2016-02-08 ENCOUNTER — Encounter: Payer: Self-pay | Admitting: Internal Medicine

## 2016-02-08 VITALS — BP 137/78 | HR 84 | Temp 98.7°F | Resp 18 | Ht 66.0 in | Wt 157.6 lb

## 2016-02-08 DIAGNOSIS — Z8521 Personal history of malignant neoplasm of larynx: Secondary | ICD-10-CM | POA: Diagnosis not present

## 2016-02-08 DIAGNOSIS — C787 Secondary malignant neoplasm of liver and intrahepatic bile duct: Secondary | ICD-10-CM

## 2016-02-08 DIAGNOSIS — C3411 Malignant neoplasm of upper lobe, right bronchus or lung: Secondary | ICD-10-CM

## 2016-02-08 NOTE — Progress Notes (Signed)
[facility] Telephone:(336) [phone]   Fax:(336) [place], [place] [phone]  PRINCIPAL DIAGNOSES:  1. 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 2.  metastatic non-small cell lung cancer initially diagnosed as Stage IA non-small-cell lung cancer diagnosed in June 2011.  PRIOR THERAPY:  1. Status post total laryngectomy with. Bilateral selective lymph node dissection as well as left superior parotidectomy with nerve dissection under the care of Dr. Redmond Baseman with positive resection margin. 2. Status post course of concurrent chemoradiation with weekly cisplatin. Last dose of chemotherapy was given January 08, 2007. 3. Status post curative radiotherapy with tomotherapy to the lung lesions completed December 31, 2009 under the care of Dr. Pablo Ledger. 4. Systemic chemotherapy with carboplatin for an AUC of 5 and paclitaxel 175 mg meter squared. Status post 6 cycles.   CURRENT THERAPY: Observation.  INTERVAL HISTORY: [name] 75 y.o. male returns to the clinic today for follow-up visit accompanied by his wife [name]. The patient is feeling fine today with no specific complaints. He has been observation for the last 9 months and doing very well. He denied having any significant nausea or vomiting, no fever or chills. He denied having any significant chest pain, shortness of breath, cough or hemoptysis. The patient had repeat CT scan of the chest, abdomen and pelvis performed recently and he is here for evaluation and discussion of his scan results.   MEDICAL HISTORY: Past Medical History:  Diagnosis Date  . Cancer ([place]) 2009   SUPERGLOTTIS INVASIVE SQ CELL CA  . Full code status 03/17/2015  . Hypertension   . Stroke [facility]) 2002   left side weakness    ALLERGIES:  has No Known Allergies.  MEDICATIONS:  Current Outpatient Prescriptions  Medication Sig Dispense  Refill  . albuterol (PROVENTIL) (2.5 MG/3ML) 0.083% nebulizer solution Take 3 mLs (2.5 mg total) by nebulization every 6 (six) hours as needed for wheezing or shortness of breath. 360 mL 5  . amLODipine (NORVASC) 5 MG tablet Take 1 tablet (5 mg total) by mouth at bedtime. 90 tablet 3  . aspirin 81 MG tablet Take 81 mg by mouth every morning.     . cholecalciferol (VITAMIN D) 1000 UNITS tablet Take 1,000 Units by mouth every morning.     . loratadine (CLARITIN) 10 MG tablet Take 10 mg by mouth daily.    . metoprolol succinate (TOPROL-XL) 25 MG 24 hr tablet Take 1 tablet (25 mg total) by mouth every morning. 90 tablet 3  . simvastatin (ZOCOR) 20 MG tablet Take 1 tablet (20 mg total) by mouth every evening. 90 tablet 3  . baclofen (LIORESAL) 10 MG tablet Take 1 tablet (10 mg total) by mouth at bedtime. (Patient not taking: Reported on 02/08/2016) 90 each 3  . diphenhydrAMINE (BENADRYL) 25 MG tablet Take 50 mg by mouth every 8 (eight) hours as needed.    . finasteride (PROSCAR) 5 MG tablet Take 1 tablet (5 mg total) by mouth every morning. (Patient not taking: Reported on 02/08/2016) 90 tablet 3  . tadalafil (CIALIS) 5 MG tablet Take 1 tablet (5 mg total) by mouth daily. 90 tablet 3  . tiZANidine (ZANAFLEX) 4 MG tablet Take 1 tablet (4 mg total) by mouth 3 (three) times daily as needed for muscle spasms. (Patient not taking: Reported on 02/08/2016) 90 tablet 1   No current facility-administered medications  for this visit.     SURGICAL HISTORY:  Past Surgical History:  Procedure Laterality Date  . TRACHEOSTOMY  2009    REVIEW OF SYSTEMS:  A comprehensive review of systems was negative.   PHYSICAL EXAMINATION: General appearance: alert, cooperative, fatigued and no distress Head: Normocephalic, without obvious abnormality, atraumatic Neck: no adenopathy, no JVD, supple, symmetrical, trachea midline and thyroid not enlarged, symmetric, no tenderness/mass/nodules Lymph nodes: Cervical,  supraclavicular, and axillary nodes normal. Resp: clear to auscultation bilaterally Back: symmetric, no curvature. ROM normal. No CVA tenderness. Cardio: regular rate and rhythm, S1, S2 normal, no murmur, click, rub or gallop GI: soft, non-tender; bowel sounds normal; no masses,  no organomegaly Extremities: extremities normal, atraumatic, no cyanosis or edema Neurologic: Alert and oriented X 3, normal strength and tone. Normal symmetric reflexes. Normal coordination and gait  ECOG PERFORMANCE STATUS: 1 - Symptomatic but completely ambulatory  Blood pressure 137/78, pulse 84, temperature 98.7 F (37.1 C), temperature source Oral, resp. rate 18, height '5\' 6"'$  (1.676 m), weight 157 lb 9.6 oz (71.5 kg), SpO2 100 %.  LABORATORY DATA: Lab Results  Component Value Date   WBC 8.2 02/01/2016   HGB 11.1 (L) 02/01/2016   HCT 34.8 (L) 02/01/2016   MCV 74.2 (L) 02/01/2016   PLT 187 02/01/2016      Chemistry      Component Value Date/Time   NA 139 02/01/2016 1200   K 3.8 02/01/2016 1200   CL 103 08/02/2012 1107   CO2 25 02/01/2016 1200   BUN 10.1 02/01/2016 1200   CREATININE 0.8 02/01/2016 1200      Component Value Date/Time   CALCIUM 9.1 02/01/2016 1200   ALKPHOS 124 02/01/2016 1200   AST 26 02/01/2016 1200   ALT 12 02/01/2016 1200   BILITOT 0.40 02/01/2016 1200       RADIOGRAPHIC STUDIES: Ct Chest W Contrast  Result Date: 02/01/2016 CLINICAL DATA:  Restaging supraglottic squamous cell carcinoma diagnosed 2008. Metastatic disease the lungs and liver. Radiation therapy complete. Chemotherapy complete. EXAM: CT CHEST, ABDOMEN, AND PELVIS WITH CONTRAST TECHNIQUE: Multidetector CT imaging of the chest, abdomen and pelvis was performed following the standard protocol during bolus administration of intravenous contrast. CONTRAST:  175m ISOVUE-300 IOPAMIDOL (ISOVUE-300) INJECTION 61% COMPARISON:  CT 10/26/2015 FINDINGS: CT CHEST FINDINGS Cardiovascular: Coronary artery calcification and  aortic atherosclerotic calcification. No pericardial fluid Mediastinum/Nodes: No axillary or supraclavicular lymphadenopathy. No mediastinal hilar lymphadenopathy. No pericardial fluid Lungs/Pleura: 6 mm nodule at the LEFT lung apex (image 37, series 4) not changed from 7 mm. Consolidation in the RIGHT upper lobe measuring 2.6 x 1.9 cm compares to 2.7 x 2.0 cm for no interval change. Small nodule inferior in the RIGHT upper lobe measuring 4 mm on image 73, series 4 also unchanged. Musculoskeletal: No aggressive osseous lesion. CT ABDOMEN PELVIS FINDINGS Hepatobiliary: Multiple hepatic metastasis again demonstrated. No new lesions are evident. Index lesion in the medial posterior RIGHT hepatic lobe measures 52 x 36 mm compared to 60 x 42 mm. Adjacent more lateral RIGHT hepatic lobe lesion measures 42 x 34 mm compared to a 48 x 41 mm. Pancreas: Lesion in tail of pancreas measures 29 x 33 mm compared to 30 x 37 mm. Spleen: Normal spleen Adrenals/Urinary Tract: No change in adrenal glands or kidneys. Bladder normal. Stomach/Bowel: Stomach, small bowel, appendix and cecum normal. The colon and rectosigmoid colon normal. Vascular/Lymphatic: Heavy calcification abdominal aorta. 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 No periportal retroperitoneal  adenopathy.  No iliac adenopathy Reproductive: Prostate normal Other: No peritoneal disease Musculoskeletal: No aggressive osseous lesion. IMPRESSION: Chest Impression: 1. Stable bilateral pulmonary nodular metastasis. Abdomen / Pelvis Impression: 1. Stable to decreased size of hepatic metastasis. 2. Stable pancreatic tail lesion. 3. No disease progression in the abdomen or pelvis. Electronically Signed   By: Suzy Bouchard M.D.   On: 02/01/2016 15:41   Ct Abdomen Pelvis W Contrast  Result Date: 02/01/2016 CLINICAL DATA:  Restaging supraglottic squamous cell carcinoma diagnosed 2008. Metastatic disease the lungs and liver. Radiation  therapy complete. Chemotherapy complete. EXAM: CT CHEST, ABDOMEN, AND PELVIS WITH CONTRAST TECHNIQUE: Multidetector CT imaging of the chest, abdomen and pelvis was performed following the standard protocol during bolus administration of intravenous contrast. CONTRAST:  146m ISOVUE-300 IOPAMIDOL (ISOVUE-300) INJECTION 61% COMPARISON:  CT 10/26/2015 FINDINGS: CT CHEST FINDINGS Cardiovascular: Coronary artery calcification and aortic atherosclerotic calcification. No pericardial fluid Mediastinum/Nodes: No axillary or supraclavicular lymphadenopathy. No mediastinal hilar lymphadenopathy. No pericardial fluid Lungs/Pleura: 6 mm nodule at the LEFT lung apex (image 37, series 4) not changed from 7 mm. Consolidation in the RIGHT upper lobe measuring 2.6 x 1.9 cm compares to 2.7 x 2.0 cm for no interval change. Small nodule inferior in the RIGHT upper lobe measuring 4 mm on image 73, series 4 also unchanged. Musculoskeletal: No aggressive osseous lesion. CT ABDOMEN PELVIS FINDINGS Hepatobiliary: Multiple hepatic metastasis again demonstrated. No new lesions are evident. Index lesion in the medial posterior RIGHT hepatic lobe measures 52 x 36 mm compared to 60 x 42 mm. Adjacent more lateral RIGHT hepatic lobe lesion measures 42 x 34 mm compared to a 48 x 41 mm. Pancreas: Lesion in tail of pancreas measures 29 x 33 mm compared to 30 x 37 mm. Spleen: Normal spleen Adrenals/Urinary Tract: No change in adrenal glands or kidneys. Bladder normal. Stomach/Bowel: Stomach, small bowel, appendix and cecum normal. The colon and rectosigmoid colon normal. Vascular/Lymphatic: Heavy calcification abdominal aorta. 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 No periportal retroperitoneal adenopathy.  No iliac adenopathy Reproductive: Prostate normal Other: No peritoneal disease Musculoskeletal: No aggressive osseous lesion. IMPRESSION: Chest Impression: 1. Stable bilateral pulmonary nodular metastasis.  Abdomen / Pelvis Impression: 1. Stable to decreased size of hepatic metastasis. 2. Stable pancreatic tail lesion. 3. No disease progression in the abdomen or pelvis. Electronically Signed   By: SSuzy BouchardM.D.   On: 02/01/2016 15:41    ASSESSMENT AND PLAN:  This is a very pleasant 75years old African-American male with metastatic non-small cell lung cancer , squamous cell carcinoma completed systemic chemotherapy with carboplatin and paclitaxel status post 6 cycles.  The patient has been on observation for the last 9 months and has no specific complaints today. The recent CT scan of the chest, abdomen and pelvis showed no evidence for disease progression. I discussed the scan results with the patient today.  I recommended for him to continue on observation with repeat CT scan of the chest, abdomen and pelvis in 6 months for restaging of his disease.  He was advised to call immediately if he has any concerning symptoms in the interval. The patient voices understanding of current disease status and treatment options and is in agreement with the current care plan.  All questions were answered. The patient knows to call the clinic with any problems, questions or concerns. We can certainly see the patient much sooner if necessary.  Disclaimer: This note was dictated with voice recognition software.  Similar sounding words can inadvertently be transcribed and may not be corrected upon review.

## 2016-02-15 ENCOUNTER — Telehealth: Payer: Self-pay | Admitting: Internal Medicine

## 2016-02-15 ENCOUNTER — Telehealth: Payer: Self-pay | Admitting: Geriatric Medicine

## 2016-02-15 NOTE — Telephone Encounter (Signed)
Patient was asking for allopurinol for gout.

## 2016-02-15 NOTE — Telephone Encounter (Signed)
Pt called request new rx for alprenolol for PRN for gout. Please advise, pt use [facility] out patient pharmacy.

## 2016-02-15 NOTE — Telephone Encounter (Signed)
Please advise, not on med list.

## 2016-02-15 NOTE — Telephone Encounter (Signed)
Advise on what?

## 2016-02-15 NOTE — Telephone Encounter (Signed)
We have never given this to him. Does he have gout? He would need visit to discuss.

## 2016-02-16 ENCOUNTER — Ambulatory Visit (INDEPENDENT_AMBULATORY_CARE_PROVIDER_SITE_OTHER): Payer: Medicare Other | Admitting: Internal Medicine

## 2016-02-16 ENCOUNTER — Encounter: Payer: Self-pay | Admitting: Internal Medicine

## 2016-02-16 ENCOUNTER — Ambulatory Visit: Payer: Medicare Other | Admitting: Internal Medicine

## 2016-02-16 DIAGNOSIS — M109 Gout, unspecified: Secondary | ICD-10-CM

## 2016-02-16 DIAGNOSIS — I10 Essential (primary) hypertension: Secondary | ICD-10-CM | POA: Diagnosis not present

## 2016-02-16 MED ORDER — COLCHICINE 0.6 MG PO TABS: 0.6000 mg | ORAL_TABLET | Freq: Two times a day (BID) | ORAL | 11 refills | Status: DC

## 2016-02-16 MED ORDER — ALLOPURINOL 100 MG PO TABS: 100.0000 mg | ORAL_TABLET | Freq: Every day | ORAL | 6 refills | Status: DC

## 2016-02-16 NOTE — Telephone Encounter (Signed)
I cannot send in allopurinol without a visit and this is not the proper treatment for a gout flare and would likely make his ankle pain worse if it is coming from gout. I do not see that he has filled the allopurinol for some time (has he been off for some time or just recently?)

## 2016-02-16 NOTE — Telephone Encounter (Signed)
Patient says he has gout and his old PCP used to give it to him, Dr. Karlton Lemon. He says his left ankle is swollen and he can't walk well enough to come in. He is asking that you send in allopurinol 100 mg without an office visit. Please advise, thanks.

## 2016-02-16 NOTE — Progress Notes (Signed)
Pre visit review using our clinic review tool, if applicable. No additional management support is needed unless otherwise documented below in the visit note. 

## 2016-02-16 NOTE — Assessment & Plan Note (Signed)
stable overall by history and exam, recent data reviewed with pt, and pt to continue medical treatment as before,  to f/u any worsening symptoms or concerns BP Readings from Last 3 Encounters:  02/16/16 118/70  02/08/16 137/78  01/11/16 (!) 160/102

## 2016-02-16 NOTE — Assessment & Plan Note (Signed)
Recent onset and debilitating, declines pain med or nsaid tx or prednisone, but for colchicine asd and allopurinol 100 qd as he has taken this well in the past,  to f/u any worsening symptoms or concerns

## 2016-02-16 NOTE — Patient Instructions (Signed)
Please take all new medication as prescribed - the colchicine, and the allopurinol  Please continue all other medications as before, and refills have been done if requested.  Please have the pharmacy call with any other refills you may need.  Please keep your appointments with your specialists as you may have planned

## 2016-02-16 NOTE — Progress Notes (Signed)
Subjective:    Patient ID: [name], male    DOB: [birth year], 75 y.o.   MRN: [id number]  HPI  Here with 4 days onset acute left ankle hot swelling marked pain so much the sheets hurt at night, and took consdierable effort to get here as family had to half carry him as cannot bear wt well on LLE.  No fever, trauma, ETOh use or diuretic.  Has hx of gout but no attacks in last 2 yrs.  Used to have what sounds like colchcine but had none at home, overall not improving so came today.  Has not tried OTC such as nsaids.  Last renal fxn good, but has mult CT with contrast and risk of renal recent and coming up.  Pt denies chest pain, increased sob or doe, wheezing, orthopnea, PND, increased LE swelling, palpitations, dizziness or syncope.  Pt denies polydipsia, polyuria  Does not want any tx with steroid as "had my fill" of recent steroid tx with chemo Past Medical History:  Diagnosis Date  . Cancer ([place]) 2009   SUPERGLOTTIS INVASIVE SQ CELL CA  . Full code status 03/17/2015  . Hypertension   . Stroke [facility]) 2002   left side weakness   Past Surgical History:  Procedure Laterality Date  . TRACHEOSTOMY  2009    reports that he quit smoking about 12 years ago. He does not have any smokeless tobacco history on file. He reports that he drinks alcohol. He reports that he does not use drugs. family history includes Cancer in his mother. No Known Allergies Current Outpatient Prescriptions on File Prior to Visit  Medication Sig Dispense Refill  . albuterol (PROVENTIL) (2.5 MG/3ML) 0.083% nebulizer solution Take 3 mLs (2.5 mg total) by nebulization every 6 (six) hours as needed for wheezing or shortness of breath. 360 mL 5  . amLODipine (NORVASC) 5 MG tablet Take 1 tablet (5 mg total) by mouth at bedtime. 90 tablet 3  . aspirin 81 MG tablet Take 81 mg by mouth every morning.     . baclofen (LIORESAL) 10 MG tablet Take 1 tablet (10 mg total) by mouth at bedtime. 90 each 3  . cholecalciferol (VITAMIN D)  1000 UNITS tablet Take 1,000 Units by mouth every morning.     . diphenhydrAMINE (BENADRYL) 25 MG tablet Take 50 mg by mouth every 8 (eight) hours as needed.    . finasteride (PROSCAR) 5 MG tablet Take 1 tablet (5 mg total) by mouth every morning. 90 tablet 3  . loratadine (CLARITIN) 10 MG tablet Take 10 mg by mouth daily.    . metoprolol succinate (TOPROL-XL) 25 MG 24 hr tablet Take 1 tablet (25 mg total) by mouth every morning. 90 tablet 3  . simvastatin (ZOCOR) 20 MG tablet Take 1 tablet (20 mg total) by mouth every evening. 90 tablet 3  . tadalafil (CIALIS) 5 MG tablet Take 1 tablet (5 mg total) by mouth daily. 90 tablet 3  . tiZANidine (ZANAFLEX) 4 MG tablet Take 1 tablet (4 mg total) by mouth 3 (three) times daily as needed for muscle spasms. 90 tablet 1   No current facility-administered medications on file prior to visit.    Review of Systems  All otherwise neg per pt      Objective:   Physical Exam BP 118/70   Pulse 81   Resp 20   Wt 157 lb (71.2 kg)   SpO2 95%   BMI 25.34 kg/m  VS noted,  Constitutional: Pt  appears in no apparent distress HENT: Head: NCAT.  Right Ear: External ear normal.  Left Ear: External ear normal.  Eyes: . Pupils are equal, round, and reactive to light. Conjunctivae and EOM are normal Neck: Normal range of motion. Neck supple.  Cardiovascular: Normal rate and regular rhythm.   Pulmonary/Chest: Effort normal and breath sounds without rales or wheezing.  Left ankle 2-3+ swelling/tender/warmth with reduced ROM Neurological: Pt is alert. Not confused , motor grossly intact Skin: Skin is warm. No rash, no LE edema Psychiatric: Pt behavior is normal. No agitation.     Assessment & Plan:

## 2016-02-16 NOTE — Telephone Encounter (Signed)
Patient will call and schedule an appointment.

## 2016-02-25 ENCOUNTER — Telehealth: Payer: Self-pay | Admitting: Internal Medicine

## 2016-02-25 NOTE — Telephone Encounter (Signed)
Called patient to conf appt, per 02/08/16 los. L/M. Appt ltr mailed.

## 2016-05-30 ENCOUNTER — Telehealth: Payer: Self-pay | Admitting: Geriatric Medicine

## 2016-05-30 NOTE — Telephone Encounter (Signed)
Left message on voice mail informing patient that he has forms ready for pick up, but to call me first because I have some information for him about the forms before he picks them up.

## 2016-06-13 MED FILL — ALBUTEROL 0.083% INHAL SOLN: (2.5 MG/3ML | 30 days supply | Qty: 360 | Fill #1

## 2016-07-13 ENCOUNTER — Encounter: Payer: Self-pay | Admitting: Internal Medicine

## 2016-07-13 ENCOUNTER — Ambulatory Visit (INDEPENDENT_AMBULATORY_CARE_PROVIDER_SITE_OTHER): Payer: Medicare Other | Admitting: Internal Medicine

## 2016-07-13 ENCOUNTER — Other Ambulatory Visit (INDEPENDENT_AMBULATORY_CARE_PROVIDER_SITE_OTHER): Payer: Medicare Other

## 2016-07-13 VITALS — BP 124/78 | HR 89 | Temp 98.3°F | Resp 12 | Ht 66.0 in | Wt 145.5 lb

## 2016-07-13 DIAGNOSIS — I69359 Hemiplegia and hemiparesis following cerebral infarction affecting unspecified side: Secondary | ICD-10-CM | POA: Diagnosis not present

## 2016-07-13 DIAGNOSIS — C329 Malignant neoplasm of larynx, unspecified: Secondary | ICD-10-CM | POA: Diagnosis not present

## 2016-07-13 DIAGNOSIS — I10 Essential (primary) hypertension: Secondary | ICD-10-CM | POA: Diagnosis not present

## 2016-07-13 DIAGNOSIS — Z0000 Encounter for general adult medical examination without abnormal findings: Secondary | ICD-10-CM

## 2016-07-13 DIAGNOSIS — M1A9XX0 Chronic gout, unspecified, without tophus (tophi): Secondary | ICD-10-CM | POA: Diagnosis not present

## 2016-07-13 DIAGNOSIS — Z23 Encounter for immunization: Secondary | ICD-10-CM

## 2016-07-13 LAB — LIPID PANEL
CHOLESTEROL: 116 mg/dL (ref 0–200)
HDL: 49.8 mg/dL (ref >39.00)
LDL Cholesterol: 53 mg/dL (ref 0–99)
NonHDL: 65.99
Total CHOL/HDL Ratio: 2
Triglycerides: 63 mg/dL (ref 0.0–149.0)
VLDL: 12.6 mg/dL (ref 0.0–40.0)

## 2016-07-13 LAB — URIC ACID: Uric Acid, Serum: 4.7 mg/dL (ref 4.0–7.8)

## 2016-07-13 MED ORDER — MONTELUKAST SODIUM 10 MG PO TABS: 10.0000 mg | ORAL_TABLET | Freq: Every day | ORAL | 1 refills | Status: DC

## 2016-07-13 MED FILL — MONTELUKAST SOD 10 MG TAB: 10 | 90 days supply | Qty: 90 | Fill #0

## 2016-07-13 NOTE — Assessment & Plan Note (Signed)
BP at goal on his proscar, amlodipine, and metoprolol.

## 2016-07-13 NOTE — Patient Instructions (Signed)
We have sent in singulair for the mucus to see if that helps. Take 1 pill daily.   We are checking the blood work and will send the results.   Health Maintenance, Male A healthy lifestyle and preventative care can promote health and wellness.  Maintain regular health, dental, and eye exams.  Eat a healthy diet. Foods like vegetables, fruits, whole grains, low-fat dairy products, and lean protein foods contain the nutrients you need and are low in calories. Decrease your intake of foods high in solid fats, added sugars, and salt. Get information about a proper diet from your health care provider, if necessary.  Regular physical exercise is one of the most important things you can do for your health. Most adults should get at least 150 minutes of moderate-intensity exercise (any activity that increases your heart rate and causes you to sweat) each week. In addition, most adults need muscle-strengthening exercises on 2 or more days a week.   Maintain a healthy weight. The body mass index (BMI) is a screening tool to identify possible weight problems. It provides an estimate of body fat based on height and weight. Your health care provider can find your BMI and can help you achieve or maintain a healthy weight. For males 20 years and older:  A BMI below 18.5 is considered underweight.  A BMI of 18.5 to 24.9 is normal.  A BMI of 25 to 29.9 is considered overweight.  A BMI of 30 and above is considered obese.  Maintain normal blood lipids and cholesterol by exercising and minimizing your intake of saturated fat. Eat a balanced diet with plenty of fruits and vegetables. Blood tests for lipids and cholesterol should begin at age 34 and be repeated every 5 years. If your lipid or cholesterol levels are high, you are over age 14, or you are at high risk for heart disease, you may need your cholesterol levels checked more frequently.Ongoing high lipid and cholesterol levels should be treated with  medicines if diet and exercise are not working.  If you smoke, find out from your health care provider how to quit. If you do not use tobacco, do not start.  Lung cancer screening is recommended for adults aged 67-80 years who are at high risk for developing lung cancer because of a history of smoking. A yearly low-dose CT scan of the lungs is recommended for people who have at least a 30-pack-year history of smoking and are current smokers or have quit within the past 15 years. A pack year of smoking is smoking an average of 1 pack of cigarettes a day for 1 year (for example, a 30-pack-year history of smoking could mean smoking 1 pack a day for 30 years or 2 packs a day for 15 years). Yearly screening should continue until the smoker has stopped smoking for at least 15 years. Yearly screening should be stopped for people who develop a health problem that would prevent them from having lung cancer treatment.  If you choose to drink alcohol, do not have more than 2 drinks per day. One drink is considered to be 12 oz (360 mL) of beer, 5 oz (150 mL) of wine, or 1.5 oz (45 mL) of liquor.  Avoid the use of street drugs. Do not share needles with anyone. Ask for help if you need support or instructions about stopping the use of drugs.  High blood pressure causes heart disease and increases the risk of stroke. High blood pressure is more likely to  develop in:  People who have blood pressure in the end of the normal range (100-139/85-89 mm Hg).  People who are overweight or obese.  People who are African American.  If you are 88-85 years of age, have your blood pressure checked every 3-5 years. If you are 60 years of age or older, have your blood pressure checked every year. You should have your blood pressure measured twice-once when you are at a hospital or clinic, and once when you are not at a hospital or clinic. Record the average of the two measurements. To check your blood pressure when you are not  at a hospital or clinic, you can use:  An automated blood pressure machine at a pharmacy.  A home blood pressure monitor.  If you are 72-45 years old, ask your health care provider if you should take aspirin to prevent heart disease.  Diabetes screening involves taking a blood sample to check your fasting blood sugar level. This should be done once every 3 years after age 47 if you are at a normal weight and without risk factors for diabetes. Testing should be considered at a younger age or be carried out more frequently if you are overweight and have at least 1 risk factor for diabetes.  Colorectal cancer can be detected and often prevented. Most routine colorectal cancer screening begins at the age of 18 and continues through age 50. However, your health care provider may recommend screening at an earlier age if you have risk factors for colon cancer. On a yearly basis, your health care provider may provide home test kits to check for hidden blood in the stool. A small camera at the end of a tube may be used to directly examine the colon (sigmoidoscopy or colonoscopy) to detect the earliest forms of colorectal cancer. Talk to your health care provider about this at age 67 when routine screening begins. A direct exam of the colon should be repeated every 5-10 years through age 43, unless early forms of precancerous polyps or small growths are found.  People who are at an increased risk for hepatitis B should be screened for this virus. You are considered at high risk for hepatitis B if:  You were born in a country where hepatitis B occurs often. Talk with your health care provider about which countries are considered high risk.  Your parents were born in a high-risk country and you have not received a shot to protect against hepatitis B (hepatitis B vaccine).  You have HIV or AIDS.  You use needles to inject street drugs.  You live with, or have sex with, someone who has hepatitis B.  You  are a man who has sex with other men (MSM).  You get hemodialysis treatment.  You take certain medicines for conditions like cancer, organ transplantation, and autoimmune conditions.  Hepatitis C blood testing is recommended for all people born from [birth year] through 1966 and any individual with known risk factors for hepatitis C.  Healthy men should no longer receive prostate-specific antigen (PSA) blood tests as part of routine cancer screening. Talk to your health care provider about prostate cancer screening.  Testicular cancer screening is not recommended for adolescents or adult males who have no symptoms. Screening includes self-exam, a health care provider exam, and other screening tests. Consult with your health care provider about any symptoms you have or any concerns you have about testicular cancer.  Practice safe sex. Use condoms and avoid high-risk sexual practices to reduce  the spread of sexually transmitted infections (STIs).  You should be screened for STIs, including gonorrhea and chlamydia if:  You are sexually active and are younger than 24 years.  You are older than 24 years, and your health care provider tells you that you are at risk for this type of infection.  Your sexual activity has changed since you were last screened, and you are at an increased risk for chlamydia or gonorrhea. Ask your health care provider if you are at risk.  If you are at risk of being infected with HIV, it is recommended that you take a prescription medicine daily to prevent HIV infection. This is called pre-exposure prophylaxis (PrEP). You are considered at risk if:  You are a man who has sex with other men (MSM).  You are a heterosexual man who is sexually active with multiple partners.  You take drugs by injection.  You are sexually active with a partner who has HIV.  Talk with your health care provider about whether you are at high risk of being infected with HIV. If you choose to begin  PrEP, you should first be tested for HIV. You should then be tested every 3 months for as long as you are taking PrEP.  Use sunscreen. Apply sunscreen liberally and repeatedly throughout the day. You should seek shade when your shadow is shorter than you. Protect yourself by wearing long sleeves, pants, a wide-brimmed hat, and sunglasses year round whenever you are outdoors.  Tell your health care provider of new moles or changes in moles, especially if there is a change in shape or color. Also, tell your health care provider if a mole is larger than the size of a pencil eraser.  A one-time screening for abdominal aortic aneurysm (AAA) and surgical repair of large AAAs by ultrasound is recommended for men aged 57-75 years who are current or former smokers.  Stay current with your vaccines (immunizations). This information is not intended to replace advice given to you by your health care provider. Make sure you discuss any questions you have with your health care provider. Document Released: 11/25/2007 Document Revised: 06/19/2014 Document Reviewed: 03/02/2015 Elsevier Interactive Patient Education  2017 [employer].

## 2016-07-13 NOTE — Assessment & Plan Note (Signed)
Still using trach and voice box to speak. With thick secretions and claritin is not helping any longer. Add singulair to see if this will help more. Has tried zyrtec in the past without good relief. If no change will try levocetirizine.

## 2016-07-13 NOTE — Progress Notes (Signed)
   Subjective:    Patient ID: [name], male    DOB: [birth year], 76 y.o.   MRN: [id number]  HPI Here for medicare wellness and physical, no new complaints. Please see A/P for status and treatment of chronic medical problems.   Diet: heart healthy, some modification with his swallowing Physical activity: sedentary, due to left sided paralysis Depression/mood screen: negative Hearing: intact to whispered voice Visual acuity: grossly normal, performs annual eye exam  ADLs: capable Fall risk: none Home safety: good Cognitive evaluation: intact to orientation, naming, recall and repetition EOL planning: adv directives discussed  I have personally reviewed and have noted 1. The patient's medical and social history - reviewed today no changes 2. Their use of alcohol, tobacco or illicit drugs 3. Their current medications and supplements 4. The patient's functional ability including ADL's, fall risks, home safety risks and hearing or visual impairment. 5. Diet and physical activities 6. Evidence for depression or mood disorders 7. Care team reviewed and updated (available in snapshot)  Review of Systems  Constitutional: Negative for activity change, appetite change, fatigue, fever and unexpected weight change.  HENT:       Thick secretions  Eyes: Negative.   Respiratory: Negative.   Cardiovascular: Negative.   Gastrointestinal: Negative.   Musculoskeletal: Positive for gait problem. Negative for arthralgias and back pain.  Skin: Negative.   Neurological: Positive for speech difficulty, weakness and numbness. Negative for dizziness, seizures and facial asymmetry.  Psychiatric/Behavioral: Negative.       Objective:   Physical Exam  Constitutional: He is oriented to person, place, and time. He appears well-developed and well-nourished.  HENT:  Head: Normocephalic and atraumatic.  Right Ear: External ear normal.  Left Ear: External ear normal.  Some thick secretions at the  oropharynx.   Eyes: EOM are normal.  Neck: Normal range of motion.  Voice box intact   Cardiovascular: Normal rate and regular rhythm.   Pulmonary/Chest: Effort normal and breath sounds normal.  Abdominal: Soft. Bowel sounds are normal. He exhibits no distension. There is no tenderness. There is no rebound.  Musculoskeletal: He exhibits no edema.  Neurological: He is alert and oriented to person, place, and time. A cranial nerve deficit is present. Coordination abnormal.  Left side paralysis  Skin: Skin is warm and dry.   Vitals:   07/13/16 0907  BP: 124/78  Pulse: 89  Resp: 12  Temp: 98.3 F (36.8 C)  TempSrc: Oral  SpO2: 98%  Weight: 145 lb 8 oz (66 kg)  Height: '5\' 6"'$  (1.676 m)      Assessment & Plan:  Flu shot given at visit.

## 2016-07-13 NOTE — Assessment & Plan Note (Signed)
Given flu shot, declines tetanus today. Declines shingles today. Counseled about sun safety and mole surveillance with past chemo. Counseled about dangers of distracted driving. Given 10 year screening recommendations.

## 2016-07-13 NOTE — Progress Notes (Signed)
Pre visit review using our clinic review tool, if applicable. No additional management support is needed unless otherwise documented below in the visit note. 

## 2016-07-13 NOTE — Assessment & Plan Note (Signed)
Still with left sided hemiplegia. No new stroke symptoms and BP management is good.

## 2016-08-09 ENCOUNTER — Encounter (HOSPITAL_COMMUNITY): Payer: Self-pay

## 2016-08-09 ENCOUNTER — Ambulatory Visit (HOSPITAL_COMMUNITY)
Admission: RE | Admit: 2016-08-09 | Discharge: 2016-08-09 | Disposition: A | Discharge status: Home / Self Care | Payer: Medicare Other | Source: Ambulatory Visit | Attending: Internal Medicine | Admitting: Internal Medicine

## 2016-08-09 ENCOUNTER — Other Ambulatory Visit (HOSPITAL_BASED_OUTPATIENT_CLINIC_OR_DEPARTMENT_OTHER): Payer: Medicare Other

## 2016-08-09 DIAGNOSIS — C787 Secondary malignant neoplasm of liver and intrahepatic bile duct: Secondary | ICD-10-CM | POA: Diagnosis not present

## 2016-08-09 DIAGNOSIS — I723 Aneurysm of iliac artery: Secondary | ICD-10-CM | POA: Insufficient documentation

## 2016-08-09 DIAGNOSIS — R918 Other nonspecific abnormal finding of lung field: Secondary | ICD-10-CM | POA: Diagnosis not present

## 2016-08-09 DIAGNOSIS — C3411 Malignant neoplasm of upper lobe, right bronchus or lung: Secondary | ICD-10-CM | POA: Diagnosis not present

## 2016-08-09 LAB — CBC WITH DIFFERENTIAL/PLATELET
BASO%: 0.4 % (ref 0.0–2.0)
BASOS ABS: 0 10*3/uL (ref 0.0–0.1)
EOS%: 1 % (ref 0.0–7.0)
Eosinophils Absolute: 0.1 10*3/uL (ref 0.0–0.5)
HCT: 34 % — ABNORMAL LOW (ref 38.4–49.9)
HEMOGLOBIN: 10.7 g/dL — AB (ref 13.0–17.1)
LYMPH%: 12.1 % — AB (ref 14.0–49.0)
MCH: 22.2 pg — AB (ref 27.2–33.4)
MCHC: 31.5 g/dL — AB (ref 32.0–36.0)
MCV: 70.7 fL — ABNORMAL LOW (ref 79.3–98.0)
MONO#: 0.5 10*3/uL (ref 0.1–0.9)
MONO%: 5.7 % (ref 0.0–14.0)
NEUT#: 6.6 10*3/uL — ABNORMAL HIGH (ref 1.5–6.5)
NEUT%: 80.8 % — ABNORMAL HIGH (ref 39.0–75.0)
Platelets: 193 10*3/uL (ref 140–400)
RBC: 4.81 10*6/uL (ref 4.20–5.82)
RDW: 20 % — AB (ref 11.0–14.6)
WBC: 8.2 10*3/uL (ref 4.0–10.3)
lymph#: 1 10*3/uL (ref 0.9–3.3)

## 2016-08-09 LAB — COMPREHENSIVE METABOLIC PANEL
ALBUMIN: 3.4 g/dL — AB (ref 3.5–5.0)
ALT: 14 U/L (ref 0–55)
AST: 25 U/L (ref 5–34)
Alkaline Phosphatase: 167 U/L — ABNORMAL HIGH (ref 40–150)
Anion Gap: 10 mEq/L (ref 3–11)
BUN: 14.2 mg/dL (ref 7.0–26.0)
CHLORIDE: 106 meq/L (ref 98–109)
CO2: 24 meq/L (ref 22–29)
Calcium: 9.3 mg/dL (ref 8.4–10.4)
Creatinine: 0.8 mg/dL (ref 0.7–1.3)
EGFR: >90 mL/min/{1.73_m2} (ref >90)
Glucose: 100 mg/dl (ref 70–140)
POTASSIUM: 4.1 meq/L (ref 3.5–5.1)
Sodium: 140 mEq/L (ref 136–145)
Total Bilirubin: 0.34 mg/dL (ref 0.20–1.20)
Total Protein: 7.4 g/dL (ref 6.4–8.3)

## 2016-08-09 MED ORDER — IOPAMIDOL (ISOVUE-300) INJECTION 61%
INTRAVENOUS | Status: AC
  Administered 2016-08-09: 100 mL
  Filled 2016-08-09: qty 100

## 2016-08-15 ENCOUNTER — Telehealth: Payer: Self-pay | Admitting: Internal Medicine

## 2016-08-15 ENCOUNTER — Ambulatory Visit (HOSPITAL_BASED_OUTPATIENT_CLINIC_OR_DEPARTMENT_OTHER): Payer: Medicare Other | Admitting: Internal Medicine

## 2016-08-15 ENCOUNTER — Encounter: Payer: Self-pay | Admitting: Internal Medicine

## 2016-08-15 VITALS — BP 142/90 | HR 88 | Temp 98.8°F | Resp 18 | Ht 66.0 in | Wt 151.0 lb

## 2016-08-15 DIAGNOSIS — Z5112 Encounter for antineoplastic immunotherapy: Secondary | ICD-10-CM

## 2016-08-15 DIAGNOSIS — Z7189 Other specified counseling: Secondary | ICD-10-CM

## 2016-08-15 DIAGNOSIS — I10 Essential (primary) hypertension: Secondary | ICD-10-CM

## 2016-08-15 DIAGNOSIS — C787 Secondary malignant neoplasm of liver and intrahepatic bile duct: Secondary | ICD-10-CM | POA: Diagnosis not present

## 2016-08-15 DIAGNOSIS — C3411 Malignant neoplasm of upper lobe, right bronchus or lung: Secondary | ICD-10-CM | POA: Diagnosis not present

## 2016-08-15 DIAGNOSIS — Z720 Tobacco use: Secondary | ICD-10-CM | POA: Diagnosis not present

## 2016-08-15 DIAGNOSIS — D539 Nutritional anemia, unspecified: Secondary | ICD-10-CM

## 2016-08-15 HISTORY — DX: Nutritional anemia, unspecified: D53.9

## 2016-08-15 HISTORY — DX: Encounter for antineoplastic immunotherapy: Z51.12

## 2016-08-15 HISTORY — DX: Other specified counseling: Z71.89

## 2016-08-15 NOTE — Progress Notes (Signed)
[place] [phone]   Fax:([phone]) [place], [place] [phone]  PRINCIPAL DIAGNOSES:  1. 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 2.  metastatic non-small cell lung cancer initially diagnosed as Stage IA non-small-cell lung cancer diagnosed in June 2011.  PRIOR THERAPY:  1. Status post total laryngectomy with. Bilateral selective lymph node dissection as well as left superior parotidectomy with nerve dissection under the care of Dr. Redmond Baseman with positive resection margin. 2. Status post course of concurrent chemoradiation with weekly cisplatin. Last dose of chemotherapy was given January 08, 2007. 3. Status post curative radiotherapy with tomotherapy to the lung lesions completed December 31, 2009 under the care of Dr. Pablo Ledger. 4. Systemic chemotherapy with carboplatin for an AUC of 5 and paclitaxel 175 mg meter squared. Status post 6 cycles.   CURRENT THERAPY: Treatment with immunotherapy with Tecentriq (Atezolizumab) 1200 MG IV every 3 weeks. First dose 08/22/2016.  INTERVAL HISTORY: [name] 76 y.o. male came to the clinic today for follow-up visit. The patient is feeling fine with no specific complaints since his last visit. He denied having any significant fatigue but continues to have weakness in the lower extremities. He has no fever or chills. He denied having any chest pain, shortness of breath, cough or hemoptysis. He has no nausea, vomiting, diarrhea or constipation. He is here today for evaluation with repeat CT scan of the chest, abdomen and pelvis for restaging of his disease.   MEDICAL HISTORY: Past Medical History:  Diagnosis Date  . Cancer ([facility]) 2009   SUPERGLOTTIS INVASIVE SQ CELL CA  . Full code status 03/17/2015  . Hypertension   . Stroke [facility]) 2002   left side weakness    ALLERGIES:  has No Known  Allergies.  MEDICATIONS:  Current Outpatient Prescriptions  Medication Sig Dispense Refill  . albuterol (PROVENTIL) (2.5 MG/3ML) 0.083% nebulizer solution Take 3 mLs (2.5 mg total) by nebulization every 6 (six) hours as needed for wheezing or shortness of breath. 360 mL 5  . allopurinol (ZYLOPRIM) 100 MG tablet Take 1 tablet (100 mg total) by mouth daily. 30 tablet 6  . amLODipine (NORVASC) 5 MG tablet Take 1 tablet (5 mg total) by mouth at bedtime. 90 tablet 3  . aspirin 81 MG tablet Take 81 mg by mouth every morning.     . baclofen (LIORESAL) 10 MG tablet Take 1 tablet (10 mg total) by mouth at bedtime. 90 each 3  . cholecalciferol (VITAMIN D) 1000 UNITS tablet Take 1,000 Units by mouth every morning.     . colchicine 0.6 MG tablet Take 1 tablet (0.6 mg total) by mouth 2 (two) times daily. 60 tablet 11  . diphenhydrAMINE (BENADRYL) 25 MG tablet Take 50 mg by mouth every 8 (eight) hours as needed.    . finasteride (PROSCAR) 5 MG tablet Take 1 tablet (5 mg total) by mouth every morning. 90 tablet 3  . loratadine (CLARITIN) 10 MG tablet Take 10 mg by mouth daily.    . metoprolol succinate (TOPROL-XL) 25 MG 24 hr tablet Take 1 tablet (25 mg total) by mouth every morning. 90 tablet 3  . montelukast (SINGULAIR) 10 MG tablet Take 1 tablet (10 mg total) by mouth at bedtime. 90 tablet 1  . simvastatin (ZOCOR) 20 MG tablet Take 1 tablet (20 mg total) by mouth every evening. 90 tablet 3  .  tadalafil (CIALIS) 5 MG tablet Take 1 tablet (5 mg total) by mouth daily. 90 tablet 3  . tiZANidine (ZANAFLEX) 4 MG tablet Take 1 tablet (4 mg total) by mouth 3 (three) times daily as needed for muscle spasms. 90 tablet 1   No current facility-administered medications for this visit.     SURGICAL HISTORY:  Past Surgical History:  Procedure Laterality Date  . TRACHEOSTOMY  2009    REVIEW OF SYSTEMS:  Constitutional: positive for fatigue Eyes: negative Ears, nose, mouth, throat, and face:  negative Respiratory: negative Cardiovascular: negative Gastrointestinal: negative Genitourinary:negative Integument/breast: negative Hematologic/lymphatic: negative Musculoskeletal:positive for muscle weakness Neurological: negative Behavioral/Psych: negative Endocrine: negative Allergic/Immunologic: negative   PHYSICAL EXAMINATION: General appearance: alert, cooperative, fatigued and no distress Head: Normocephalic, without obvious abnormality, atraumatic Neck: no adenopathy, no JVD, supple, symmetrical, trachea midline and thyroid not enlarged, symmetric, no tenderness/mass/nodules Lymph nodes: Cervical, supraclavicular, and axillary nodes normal. Resp: clear to auscultation bilaterally Back: symmetric, no curvature. ROM normal. No CVA tenderness. Cardio: regular rate and rhythm, S1, S2 normal, no murmur, click, rub or gallop GI: soft, non-tender; bowel sounds normal; no masses,  no organomegaly Extremities: extremities normal, atraumatic, no cyanosis or edema Neurologic: Alert and oriented X 3, normal strength and tone. Normal symmetric reflexes. Normal coordination and gait  ECOG PERFORMANCE STATUS: 1 - Symptomatic but completely ambulatory  Blood pressure (!) 142/90, pulse 88, temperature 98.8 F (37.1 C), temperature source Oral, resp. rate 18, height '5\' 6"'$  (1.676 m), weight 151 lb (68.5 kg), SpO2 98 %.  LABORATORY DATA: Lab Results  Component Value Date   WBC 8.2 08/09/2016   HGB 10.7 (L) 08/09/2016   HCT 34.0 (L) 08/09/2016   MCV 70.7 (L) 08/09/2016   PLT 193 08/09/2016      Chemistry      Component Value Date/Time   NA 140 08/09/2016 1129   K 4.1 08/09/2016 1129   CL 103 08/02/2012 1107   CO2 24 08/09/2016 1129   BUN 14.2 08/09/2016 1129   CREATININE 0.8 08/09/2016 1129      Component Value Date/Time   CALCIUM 9.3 08/09/2016 1129   ALKPHOS 167 (H) 08/09/2016 1129   AST 25 08/09/2016 1129   ALT 14 08/09/2016 1129   BILITOT 0.34 08/09/2016 1129        RADIOGRAPHIC STUDIES: Ct Chest W Contrast  Result Date: 08/09/2016 CLINICAL DATA:  Supraglottic squamous cell carcinoma. Subsequent treatment evaluation. Metastatic disease to the liver in lungs. EXAM: CT CHEST, ABDOMEN, AND PELVIS WITH CONTRAST TECHNIQUE: Multidetector CT imaging of the chest, abdomen and pelvis was performed following the standard protocol during bolus administration of intravenous contrast. CONTRAST:  1 ISOVUE-300 IOPAMIDOL (ISOVUE-300) INJECTION 61% COMPARISON:  CT 02/01/2016 FINDINGS: CT CHEST FINDINGS Cardiovascular: Coronary artery calcification and aortic atherosclerotic calcification. Mediastinum/Nodes: No axillary or supraclavicular adenopathy. No mediastinal hilar adenopathy. No pericardial fluid. Gas within esophagus. Lungs/Pleura: 7 mm nodule in the LEFT upper lobe is unchanged. Rounded nodule along the RIGHT horizontal fissure measures 2.7 x 2 point 0 cm compared with 2.9 by 2.4 cm for no significant change. More inferior RIGHT middle lobe nodule measures 5 mm (image 64, series 5 unchanged. No new pulmonary nodules. Musculoskeletal: No aggressive osseous lesion. CT ABDOMEN AND PELVIS FINDINGS Hepatobiliary: Multiple round hepatic metastasis are again noted. Lesions of the most part not significant changed. Example lesion in the RIGHT hepatic lobe measures 4.1 cm (image 60, series 3) compared with 4.0 cm. Example LEFT hepatic lobe lesion measures 1.1 cm (image 51,  series 3 compared with 0.9 cm. Lesion in central LEFT hepatic lobe is increased in size measuring 3.1 x 2.6 cm compared to 2.3 x 1.8 cm. No biliary duct dilatation.  Normal gallbladder Pancreas: Fullness the tail of pancreas is again noted (image 52, series 3). No change. Spleen: Adrenal glands, kidneys, ureters and bladder is unchanged. Adrenals/urinary tract: Adrenal glands and kidneys are normal. The ureters and bladder normal. Stomach/Bowel: Stomach, small bowel, appendix, and cecum are normal. The colon and  rectosigmoid colon are normal. Vascular/Lymphatic: Abdominal aorta is normal caliber with atherosclerotic calcification. There is no retroperitoneal or periportal lymphadenopathy. No pelvic lymphadenopathy. 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 Reproductive: Prostate normal Other: No omental disease or peritoneal disease Musculoskeletal: No aggressive osseous lesion. IMPRESSION: Chest Impression: 1. Stable bilateral pulmonary nodules. 2. No mediastinal adenopathy. Abdomen / Pelvis Impression: 1. Overall stable hepatic metastasis. One central LEFT hepatic lobe lesion measures larger. 2. Stable fullness in the tail the pancreas. 3. No metastatic adenopathy or peritoneal disease. 4. 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 Electronically Signed   By: Suzy Bouchard M.D.   On: 08/09/2016 15:57   Ct Abdomen Pelvis W Contrast  Result Date: 08/09/2016 CLINICAL DATA:  Supraglottic squamous cell carcinoma. Subsequent treatment evaluation. Metastatic disease to the liver in lungs. EXAM: CT CHEST, ABDOMEN, AND PELVIS WITH CONTRAST TECHNIQUE: Multidetector CT imaging of the chest, abdomen and pelvis was performed following the standard protocol during bolus administration of intravenous contrast. CONTRAST:  1 ISOVUE-300 IOPAMIDOL (ISOVUE-300) INJECTION 61% COMPARISON:  CT 02/01/2016 FINDINGS: CT CHEST FINDINGS Cardiovascular: Coronary artery calcification and aortic atherosclerotic calcification. Mediastinum/Nodes: No axillary or supraclavicular adenopathy. No mediastinal hilar adenopathy. No pericardial fluid. Gas within esophagus. Lungs/Pleura: 7 mm nodule in the LEFT upper lobe is unchanged. Rounded nodule along the RIGHT horizontal fissure measures 2.7 x 2 point 0 cm compared with 2.9 by 2.4 cm for no significant change. More inferior RIGHT middle lobe nodule measures 5 mm (image 64, series 5 unchanged. No new pulmonary nodules. Musculoskeletal: No aggressive  osseous lesion. CT ABDOMEN AND PELVIS FINDINGS Hepatobiliary: Multiple round hepatic metastasis are again noted. Lesions of the most part not significant changed. Example lesion in the RIGHT hepatic lobe measures 4.1 cm (image 60, series 3) compared with 4.0 cm. Example LEFT hepatic lobe lesion measures 1.1 cm (image 51, series 3 compared with 0.9 cm. Lesion in central LEFT hepatic lobe is increased in size measuring 3.1 x 2.6 cm compared to 2.3 x 1.8 cm. No biliary duct dilatation.  Normal gallbladder Pancreas: Fullness the tail of pancreas is again noted (image 52, series 3). No change. Spleen: Adrenal glands, kidneys, ureters and bladder is unchanged. Adrenals/urinary tract: Adrenal glands and kidneys are normal. The ureters and bladder normal. Stomach/Bowel: Stomach, small bowel, appendix, and cecum are normal. The colon and rectosigmoid colon are normal. Vascular/Lymphatic: Abdominal aorta is normal caliber with atherosclerotic calcification. There is no retroperitoneal or periportal lymphadenopathy. No pelvic lymphadenopathy. 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 Reproductive: Prostate normal Other: No omental disease or peritoneal disease Musculoskeletal: No aggressive osseous lesion. IMPRESSION: Chest Impression: 1. Stable bilateral pulmonary nodules. 2. No mediastinal adenopathy. Abdomen / Pelvis Impression: 1. Overall stable hepatic metastasis. One central LEFT hepatic lobe lesion measures larger. 2. Stable fullness in the tail the pancreas. 3. No metastatic adenopathy or peritoneal disease. 4. 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 Electronically Signed  By: Suzy Bouchard M.D.   On: 08/09/2016 15:57    ASSESSMENT AND PLAN:   This is a very pleasant 76 years old African-American male with metastatic non-small cell lung cancer, squamous cell carcinoma status post 6 cycles of systemic chemotherapy with carboplatin and paclitaxel and has  been observation for more than a year. The patient is currently asymptomatic. He had repeat CT scan of the chest, abdomen and pelvis. I personally and independently reviewed the scan images and discussed the results with the patient today. He continues to have several metastatic liver lesions which are stable except for one central left hepatic lobe lesion which is larger on the recent scan. I had a lengthy discussion with the patient today about his current condition and treatment options. I gave him the option of continuous observation and close monitoring of the liver lesions versus proceeding with second line treatment with immunotherapy with Tecentriq (Atezolizumab) 1200 MG IV every 3 weeks. I discussed with the patient adverse effect of the immunotherapy including but not limited to immune mediated a skin rash, diarrhea, liver, renal, lung, thyroid or other endocrine dysfunction. He is interested in proceeding with the treatment with the immunotherapy and he is expected to start the first dose of this treatment next week. I will see him back for follow-up visit in 4 weeks for evaluation before starting cycle #2. For the anemia, his hemoglobin and hematocrit are stable. We'll continue to monitor for now. For hypertension, the patient will continue his current treatment with Norvasc and Toprol-XL. He was advised to call immediately if he has any concerning symptoms in the interval. The patient voices understanding of current disease status and treatment options and is in agreement with the current care plan.  All questions were answered. The patient knows to call the clinic with any problems, questions or concerns. We can certainly see the patient much sooner if necessary.  Disclaimer: This note was dictated with voice recognition software. Similar sounding words can inadvertently be transcribed and may not be corrected upon review.

## 2016-08-15 NOTE — Progress Notes (Signed)
START ON PATHWAY REGIMEN - Non-Small Cell Lung     A cycle is every 21 days:     Atezolizumab        Dose Mod: None  **Always confirm dose/schedule in your pharmacy ordering system**    Patient Characteristics: Stage IV Metastatic, Squamous, PS = 0, 1, Second Line, No Prior PD-1/PD-L1  Inhibitor and Immunotherapy Candidate AJCC T Category: T1a Current Disease Status: Distant Metastases AJCC N Category: N0 AJCC M Category: M1c AJCC 8 Stage Grouping: IVB Histology: Squamous Cell Line of therapy: Second Line PD-L1 Expression Status: Test Not Ordered Performance Status: PS = 0, 1 Would you be surprised if this patient died  in the next year? I would NOT be surprised if this patient died in the next year Immunotherapy Candidate Status: Candidate for Immunotherapy Prior Immunotherapy Status: No Prior PD-1/PD-L1 Inhibitor  Intent of Therapy: Non-Curative / Palliative Intent, Discussed with Patient

## 2016-08-15 NOTE — Telephone Encounter (Signed)
Spoke with patient re next appointment for 3/13. Patient will get new schedule 3/13.

## 2016-08-16 ENCOUNTER — Other Ambulatory Visit: Payer: Self-pay | Admitting: *Deleted

## 2016-08-16 MED ORDER — ALLOPURINOL 100 MG PO TABS: 100.0000 mg | ORAL_TABLET | Freq: Every day | ORAL | 3 refills | Status: DC

## 2016-08-22 ENCOUNTER — Other Ambulatory Visit (HOSPITAL_BASED_OUTPATIENT_CLINIC_OR_DEPARTMENT_OTHER): Payer: Medicare Other

## 2016-08-22 ENCOUNTER — Ambulatory Visit (HOSPITAL_BASED_OUTPATIENT_CLINIC_OR_DEPARTMENT_OTHER): Payer: Medicare Other

## 2016-08-22 VITALS — BP 151/89 | HR 86 | Temp 98.5°F | Resp 18

## 2016-08-22 DIAGNOSIS — Z5112 Encounter for antineoplastic immunotherapy: Secondary | ICD-10-CM

## 2016-08-22 DIAGNOSIS — C3411 Malignant neoplasm of upper lobe, right bronchus or lung: Secondary | ICD-10-CM | POA: Diagnosis not present

## 2016-08-22 DIAGNOSIS — Z79899 Other long term (current) drug therapy: Secondary | ICD-10-CM | POA: Diagnosis not present

## 2016-08-22 LAB — CBC WITH DIFFERENTIAL/PLATELET
BASO%: 0.4 % (ref 0.0–2.0)
Basophils Absolute: 0 10*3/uL (ref 0.0–0.1)
EOS%: 0.6 % (ref 0.0–7.0)
Eosinophils Absolute: 0.1 10*3/uL (ref 0.0–0.5)
HCT: 33.3 % — ABNORMAL LOW (ref 38.4–49.9)
HGB: 10.4 g/dL — ABNORMAL LOW (ref 13.0–17.1)
LYMPH#: 0.7 10*3/uL — AB (ref 0.9–3.3)
LYMPH%: 8.8 % — AB (ref 14.0–49.0)
MCH: 22 pg — ABNORMAL LOW (ref 27.2–33.4)
MCHC: 31.2 g/dL — AB (ref 32.0–36.0)
MCV: 70.4 fL — ABNORMAL LOW (ref 79.3–98.0)
MONO#: 0.6 10*3/uL (ref 0.1–0.9)
MONO%: 7.8 % (ref 0.0–14.0)
NEUT#: 6.6 10*3/uL — ABNORMAL HIGH (ref 1.5–6.5)
NEUT%: 82.4 % — AB (ref 39.0–75.0)
Platelets: 168 10*3/uL (ref 140–400)
RBC: 4.73 10*6/uL (ref 4.20–5.82)
RDW: 19.5 % — ABNORMAL HIGH (ref 11.0–14.6)
WBC: 8 10*3/uL (ref 4.0–10.3)

## 2016-08-22 LAB — COMPREHENSIVE METABOLIC PANEL
ALK PHOS: 155 U/L — AB (ref 40–150)
ALT: 11 U/L (ref 0–55)
AST: 25 U/L (ref 5–34)
Albumin: 3.4 g/dL — ABNORMAL LOW (ref 3.5–5.0)
Anion Gap: 9 mEq/L (ref 3–11)
BILIRUBIN TOTAL: 0.34 mg/dL (ref 0.20–1.20)
BUN: 11.8 mg/dL (ref 7.0–26.0)
CO2: 23 mEq/L (ref 22–29)
Calcium: 9.1 mg/dL (ref 8.4–10.4)
Chloride: 107 mEq/L (ref 98–109)
Creatinine: 0.9 mg/dL (ref 0.7–1.3)
GLUCOSE: 96 mg/dL (ref 70–140)
POTASSIUM: 3.9 meq/L (ref 3.5–5.1)
SODIUM: 140 meq/L (ref 136–145)
Total Protein: 7.2 g/dL (ref 6.4–8.3)

## 2016-08-22 LAB — TSH: TSH: 4.121 m(IU)/L — ABNORMAL HIGH (ref 0.320–4.118)

## 2016-08-22 MED ORDER — SODIUM CHLORIDE 0.9 % IV SOLN
Freq: Once | INTRAVENOUS | Status: AC
  Administered 2016-08-22: 13:00:00 via INTRAVENOUS

## 2016-08-22 MED ORDER — SODIUM CHLORIDE 0.9 % IV SOLN
1200.0000 mg | Freq: Once | INTRAVENOUS | Status: AC
  Administered 2016-08-22: 1200 mg via INTRAVENOUS
  Filled 2016-08-22: qty 20

## 2016-08-22 NOTE — Patient Instructions (Signed)
[facility] Discharge Instructions for Patients Receiving Chemotherapy  Today you received the following chemotherapy agents Tecentriq  To help prevent nausea and vomiting after your treatment, we encourage you to take your nausea medication as prescribed  If you develop nausea and vomiting that is not controlled by your nausea medication, call the clinic.   BELOW ARE SYMPTOMS THAT SHOULD BE REPORTED IMMEDIATELY:  *FEVER GREATER THAN 100.5 F  *CHILLS WITH OR WITHOUT FEVER  NAUSEA AND VOMITING THAT IS NOT CONTROLLED WITH YOUR NAUSEA MEDICATION  *UNUSUAL SHORTNESS OF BREATH  *UNUSUAL BRUISING OR BLEEDING  TENDERNESS IN MOUTH AND THROAT WITH OR WITHOUT PRESENCE OF ULCERS  *URINARY PROBLEMS  *BOWEL PROBLEMS  UNUSUAL RASH Items with * indicate a potential emergency and should be followed up as soon as possible.  Feel free to call the clinic you have any questions or concerns. The clinic phone number is [phone].  Please show the [facility] at check-in to the Emergency Department and triage nurse.  Atezolizumab injection/ Tecentriq What is this medicine? ATEZOLIZUMAB (a te zoe LIZ ue mab) is a monoclonal antibody. It is used to treat bladder cancer (urothelial cancer) and non-small cell lung cancer. This medicine may be used for other purposes; ask your health care provider or pharmacist if you have questions. COMMON BRAND NAME(S): Tecentriq What should I tell my health care provider before I take this medicine? They need to know if you have any of these conditions: -diabetes -immune system problems -infection -inflammatory bowel disease -liver disease -lung or breathing disease -lupus -nervous system problems like myasthenia gravis or Guillain-Barre syndrome -organ transplant -an unusual or allergic reaction to atezolizumab, other medicines, foods, dyes, or preservatives -pregnant or trying to get pregnant -breast-feeding How should I use this  medicine? This medicine is for infusion into a vein. It is given by a health care professional in a hospital or clinic setting. A special MedGuide will be given to you before each treatment. Be sure to read this information carefully each time. Talk to your pediatrician regarding the use of this medicine in children. Special care may be needed. Overdosage: If you think you have taken too much of this medicine contact a poison control center or emergency room at once. NOTE: This medicine is only for you. Do not share this medicine with others. What if I miss a dose? It is important not to miss your dose. Call your doctor or health care professional if you are unable to keep an appointment. What may interact with this medicine? Interactions have not been studied. This list may not describe all possible interactions. Give your health care provider a list of all the medicines, herbs, non-prescription drugs, or dietary supplements you use. Also tell them if you smoke, drink alcohol, or use illegal drugs. Some items may interact with your medicine. What should I watch for while using this medicine? Your condition will be monitored carefully while you are receiving this medicine. You may need blood work done while you are taking this medicine. Do not become pregnant while taking this medicine or for at least 5 months after stopping it. Women should inform their doctor if they wish to become pregnant or think they might be pregnant. There is a potential for serious side effects to an unborn child. Talk to your health care professional or pharmacist for more information. Do not breast-feed an infant while taking this medicine or for at least 5 months after the last dose. What side effects  may I notice from receiving this medicine? Side effects that you should report to your doctor or health care professional as soon as possible: -allergic reactions like skin rash, itching or hives, swelling of the face,  lips, or tongue -black, tarry stools -bloody or watery diarrhea -breathing problems -changes in vision -chest pain or chest tightness -chills -facial flushing -fever -headache -signs and symptoms of high blood sugar such as dizziness; dry mouth; dry skin; fruity breath; nausea; stomach pain; increased hunger or thirst; increased urination -signs and symptoms of liver injury like dark yellow or brown urine; general ill feeling or flu-like symptoms; light-colored stools; loss of appetite; nausea; right upper belly pain; unusually weak or tired; yellowing of the eyes or skin -stomach pain -trouble passing urine or change in the amount of urine Side effects that usually do not require medical attention (report to your doctor or health care professional if they continue or are bothersome): -cough -diarrhea -joint pain -muscle pain -muscle weakness -tiredness -weight loss This list may not describe all possible side effects. Call your doctor for medical advice about side effects. You may report side effects to FDA at 1-800-FDA-1088. Where should I keep my medicine? This drug is given in a hospital or clinic and will not be stored at home. NOTE: This sheet is a summary. It may not cover all possible information. If you have questions about this medicine, talk to your doctor, pharmacist, or health care provider.  2018 Elsevier/Gold Standard (2015-06-30 17:54:14) Atezolizumab injection What is this medicine? ATEZOLIZUMAB (a te zoe LIZ ue mab) is a monoclonal antibody. It is used to treat bladder cancer (urothelial cancer) and non-small cell lung cancer. This medicine may be used for other purposes; ask your health care provider or pharmacist if you have questions. COMMON BRAND NAME(S): Tecentriq What should I tell my health care provider before I take this medicine? They need to know if you have any of these conditions: -diabetes -immune system problems -infection -inflammatory bowel  disease -liver disease -lung or breathing disease -lupus -nervous system problems like myasthenia gravis or Guillain-Barre syndrome -organ transplant -an unusual or allergic reaction to atezolizumab, other medicines, foods, dyes, or preservatives -pregnant or trying to get pregnant -breast-feeding How should I use this medicine? This medicine is for infusion into a vein. It is given by a health care professional in a hospital or clinic setting. A special MedGuide will be given to you before each treatment. Be sure to read this information carefully each time. Talk to your pediatrician regarding the use of this medicine in children. Special care may be needed. Overdosage: If you think you have taken too much of this medicine contact a poison control center or emergency room at once. NOTE: This medicine is only for you. Do not share this medicine with others. What if I miss a dose? It is important not to miss your dose. Call your doctor or health care professional if you are unable to keep an appointment. What may interact with this medicine? Interactions have not been studied. This list may not describe all possible interactions. Give your health care provider a list of all the medicines, herbs, non-prescription drugs, or dietary supplements you use. Also tell them if you smoke, drink alcohol, or use illegal drugs. Some items may interact with your medicine. What should I watch for while using this medicine? Your condition will be monitored carefully while you are receiving this medicine. You may need blood work done while you are taking this medicine.  Do not become pregnant while taking this medicine or for at least 5 months after stopping it. Women should inform their doctor if they wish to become pregnant or think they might be pregnant. There is a potential for serious side effects to an unborn child. Talk to your health care professional or pharmacist for more information. Do not  breast-feed an infant while taking this medicine or for at least 5 months after the last dose. What side effects may I notice from receiving this medicine? Side effects that you should report to your doctor or health care professional as soon as possible: -allergic reactions like skin rash, itching or hives, swelling of the face, lips, or tongue -black, tarry stools -bloody or watery diarrhea -breathing problems -changes in vision -chest pain or chest tightness -chills -facial flushing -fever -headache -signs and symptoms of high blood sugar such as dizziness; dry mouth; dry skin; fruity breath; nausea; stomach pain; increased hunger or thirst; increased urination -signs and symptoms of liver injury like dark yellow or brown urine; general ill feeling or flu-like symptoms; light-colored stools; loss of appetite; nausea; right upper belly pain; unusually weak or tired; yellowing of the eyes or skin -stomach pain -trouble passing urine or change in the amount of urine Side effects that usually do not require medical attention (report to your doctor or health care professional if they continue or are bothersome): -cough -diarrhea -joint pain -muscle pain -muscle weakness -tiredness -weight loss This list may not describe all possible side effects. Call your doctor for medical advice about side effects. You may report side effects to FDA at 1-800-FDA-1088. Where should I keep my medicine? This drug is given in a hospital or clinic and will not be stored at home. NOTE: This sheet is a summary. It may not cover all possible information. If you have questions about this medicine, talk to your doctor, pharmacist, or health care provider.  2018 Elsevier/Gold Standard (2015-06-30 17:54:14)

## 2016-08-25 ENCOUNTER — Telehealth: Payer: Self-pay | Admitting: Internal Medicine

## 2016-08-25 NOTE — Telephone Encounter (Signed)
Faxed FMLA paperwork to [employer] fax [phone]

## 2016-09-12 ENCOUNTER — Other Ambulatory Visit (HOSPITAL_BASED_OUTPATIENT_CLINIC_OR_DEPARTMENT_OTHER): Payer: Medicare Other

## 2016-09-12 ENCOUNTER — Ambulatory Visit (HOSPITAL_BASED_OUTPATIENT_CLINIC_OR_DEPARTMENT_OTHER): Payer: Medicare Other | Admitting: Internal Medicine

## 2016-09-12 ENCOUNTER — Encounter: Payer: Self-pay | Admitting: Internal Medicine

## 2016-09-12 ENCOUNTER — Ambulatory Visit (HOSPITAL_BASED_OUTPATIENT_CLINIC_OR_DEPARTMENT_OTHER): Payer: Medicare Other

## 2016-09-12 VITALS — BP 145/78 | HR 99 | Temp 98.7°F | Resp 18 | Ht 66.0 in | Wt 144.8 lb

## 2016-09-12 DIAGNOSIS — Z923 Personal history of irradiation: Secondary | ICD-10-CM

## 2016-09-12 DIAGNOSIS — Z79899 Other long term (current) drug therapy: Secondary | ICD-10-CM

## 2016-09-12 DIAGNOSIS — Z8521 Personal history of malignant neoplasm of larynx: Secondary | ICD-10-CM | POA: Diagnosis not present

## 2016-09-12 DIAGNOSIS — C787 Secondary malignant neoplasm of liver and intrahepatic bile duct: Secondary | ICD-10-CM

## 2016-09-12 DIAGNOSIS — C3411 Malignant neoplasm of upper lobe, right bronchus or lung: Secondary | ICD-10-CM

## 2016-09-12 DIAGNOSIS — Z5112 Encounter for antineoplastic immunotherapy: Secondary | ICD-10-CM | POA: Diagnosis not present

## 2016-09-12 LAB — CBC WITH DIFFERENTIAL/PLATELET
BASO%: 0.3 % (ref 0.0–2.0)
BASOS ABS: 0 10*3/uL (ref 0.0–0.1)
EOS%: 0.9 % (ref 0.0–7.0)
Eosinophils Absolute: 0.1 10*3/uL (ref 0.0–0.5)
HEMATOCRIT: 33.9 % — AB (ref 38.4–49.9)
HGB: 10.6 g/dL — ABNORMAL LOW (ref 13.0–17.1)
LYMPH%: 11.1 % — ABNORMAL LOW (ref 14.0–49.0)
MCH: 21.7 pg — AB (ref 27.2–33.4)
MCHC: 31.3 g/dL — AB (ref 32.0–36.0)
MCV: 69.5 fL — ABNORMAL LOW (ref 79.3–98.0)
MONO#: 0.5 10*3/uL (ref 0.1–0.9)
MONO%: 4.8 % (ref 0.0–14.0)
NEUT#: 7.8 10*3/uL — ABNORMAL HIGH (ref 1.5–6.5)
NEUT%: 82.9 % — AB (ref 39.0–75.0)
Platelets: 262 10*3/uL (ref 140–400)
RBC: 4.88 10*6/uL (ref 4.20–5.82)
RDW: 18.5 % — AB (ref 11.0–14.6)
WBC: 9.4 10*3/uL (ref 4.0–10.3)
lymph#: 1 10*3/uL (ref 0.9–3.3)

## 2016-09-12 LAB — COMPREHENSIVE METABOLIC PANEL
ALT: 16 U/L (ref 0–55)
AST: 26 U/L (ref 5–34)
Albumin: 3.2 g/dL — ABNORMAL LOW (ref 3.5–5.0)
Alkaline Phosphatase: 178 U/L — ABNORMAL HIGH (ref 40–150)
Anion Gap: 10 mEq/L (ref 3–11)
BUN: 11.6 mg/dL (ref 7.0–26.0)
CALCIUM: 8.8 mg/dL (ref 8.4–10.4)
CHLORIDE: 106 meq/L (ref 98–109)
CO2: 22 mEq/L (ref 22–29)
Creatinine: 0.8 mg/dL (ref 0.7–1.3)
EGFR: >90 mL/min/{1.73_m2} (ref >90)
Glucose: 143 mg/dl — ABNORMAL HIGH (ref 70–140)
POTASSIUM: 3.9 meq/L (ref 3.5–5.1)
SODIUM: 139 meq/L (ref 136–145)
Total Bilirubin: 0.43 mg/dL (ref 0.20–1.20)
Total Protein: 7.2 g/dL (ref 6.4–8.3)

## 2016-09-12 LAB — TSH: TSH: 2.557 m[IU]/L (ref 0.320–4.118)

## 2016-09-12 MED ORDER — SODIUM CHLORIDE 0.9 % IV SOLN
1200.0000 mg | Freq: Once | INTRAVENOUS | Status: AC
  Administered 2016-09-12: 1200 mg via INTRAVENOUS
  Filled 2016-09-12: qty 20

## 2016-09-12 MED ORDER — SODIUM CHLORIDE 0.9 % IV SOLN
Freq: Once | INTRAVENOUS | Status: AC
  Administered 2016-09-12: 14:00:00 via INTRAVENOUS

## 2016-09-12 NOTE — Progress Notes (Signed)
[place] [phone]   Fax:(336) [place], [place] [address]  PRINCIPAL DIAGNOSES:  1. 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 2.  metastatic non-small cell lung cancer initially diagnosed as Stage IA non-small-cell lung cancer diagnosed in June 2011.  PRIOR THERAPY:  1. Status post total laryngectomy with. Bilateral selective lymph node dissection as well as left superior parotidectomy with nerve dissection under the care of Dr. Redmond Baseman with positive resection margin. 2. Status post course of concurrent chemoradiation with weekly cisplatin. Last dose of chemotherapy was given January 08, 2007. 3. Status post curative radiotherapy with tomotherapy to the lung lesions completed December 31, 2009 under the care of Dr. Pablo Ledger. 4. Systemic chemotherapy with carboplatin for an AUC of 5 and paclitaxel 175 mg meter squared. Status post 6 cycles.   CURRENT THERAPY: Treatment with immunotherapy with Tecentriq (Atezolizumab) 1200 MG IV every 3 weeks. First dose 08/22/2016.  INTERVAL HISTORY: [name] 76 y.o. male returns to the clinic today for follow-up visit accompanied by his wife. The patient related the first dose of his treatment with immunotherapy with Tecentriq [name]) fairly well except for fatigue for a few days as well as few episodes of nausea. He lost few pounds since his last visit. He and his wife were in a shock after receiving the first bill for Tecentriq [employer]) which was over $21,000. There are also have a significant portion of copayment. The patient is wondering about how long he can stay on this treatment. He denied having any chest pain, shortness of breath but continues to have mild cough with no hemoptysis. He has no fever or chills. He is here today for evaluation before starting cycle #2.   MEDICAL  HISTORY: Past Medical History:  Diagnosis Date  . Cancer ([place]) 2009   SUPERGLOTTIS INVASIVE SQ CELL CA  . Deficiency anemia 08/15/2016  . Encounter for antineoplastic immunotherapy 08/15/2016  . Full code status 03/17/2015  . Goals of care, counseling/discussion 08/15/2016  . Hypertension   . Stroke [facility]) 2002   left side weakness    ALLERGIES:  has No Known Allergies.  MEDICATIONS:  Current Outpatient Prescriptions  Medication Sig Dispense Refill  . albuterol (PROVENTIL) (2.5 MG/3ML) 0.083% nebulizer solution Take 3 mLs (2.5 mg total) by nebulization every 6 (six) hours as needed for wheezing or shortness of breath. 360 mL 5  . allopurinol (ZYLOPRIM) 100 MG tablet Take 1 tablet (100 mg total) by mouth daily. 90 tablet 3  . amLODipine (NORVASC) 5 MG tablet Take 1 tablet (5 mg total) by mouth at bedtime. 90 tablet 3  . aspirin 81 MG tablet Take 81 mg by mouth every morning.     . baclofen (LIORESAL) 10 MG tablet Take 1 tablet (10 mg total) by mouth at bedtime. 90 each 3  . cholecalciferol (VITAMIN D) 1000 UNITS tablet Take 1,000 Units by mouth every morning.     . colchicine 0.6 MG tablet Take 1 tablet (0.6 mg total) by mouth 2 (two) times daily. 60 tablet 11  . diphenhydrAMINE (BENADRYL) 25 MG tablet Take 50 mg by mouth every 8 (eight) hours as needed.    . finasteride (PROSCAR) 5 MG tablet Take 1 tablet (5 mg total) by mouth every morning. 90 tablet 3  . loratadine (CLARITIN) 10 MG tablet Take 10 mg by mouth daily.    Marland Kitchen  metoprolol succinate (TOPROL-XL) 25 MG 24 hr tablet Take 1 tablet (25 mg total) by mouth every morning. 90 tablet 3  . montelukast (SINGULAIR) 10 MG tablet Take 1 tablet (10 mg total) by mouth at bedtime. 90 tablet 1  . simvastatin (ZOCOR) 20 MG tablet Take 1 tablet (20 mg total) by mouth every evening. 90 tablet 3  . tiZANidine (ZANAFLEX) 4 MG tablet Take 1 tablet (4 mg total) by mouth 3 (three) times daily as needed for muscle spasms. 90 tablet 1  . tadalafil (CIALIS) 5  MG tablet Take 1 tablet (5 mg total) by mouth daily. 90 tablet 3   No current facility-administered medications for this visit.     SURGICAL HISTORY:  Past Surgical History:  Procedure Laterality Date  . TRACHEOSTOMY  2009    REVIEW OF SYSTEMS:  A comprehensive review of systems was negative except for: Constitutional: positive for fatigue Respiratory: positive for cough Musculoskeletal: positive for muscle weakness   PHYSICAL EXAMINATION: General appearance: alert, cooperative, fatigued and no distress Head: Normocephalic, without obvious abnormality, atraumatic Neck: no adenopathy, no JVD, supple, symmetrical, trachea midline and thyroid not enlarged, symmetric, no tenderness/mass/nodules Lymph nodes: Cervical, supraclavicular, and axillary nodes normal. Resp: clear to auscultation bilaterally Back: symmetric, no curvature. ROM normal. No CVA tenderness. Cardio: regular rate and rhythm, S1, S2 normal, no murmur, click, rub or gallop GI: soft, non-tender; bowel sounds normal; no masses,  no organomegaly Extremities: extremities normal, atraumatic, no cyanosis or edema  ECOG PERFORMANCE STATUS: 1 - Symptomatic but completely ambulatory  Blood pressure (!) 145/78, pulse 99, temperature 98.7 F (37.1 C), temperature source Oral, resp. rate 18, height '5\' 6"'$  (1.676 m), weight 144 lb 12.8 oz (65.7 kg), SpO2 100 %.  LABORATORY DATA: Lab Results  Component Value Date   WBC 9.4 09/12/2016   HGB 10.6 (L) 09/12/2016   HCT 33.9 (L) 09/12/2016   MCV 69.5 (L) 09/12/2016   PLT 262 09/12/2016      Chemistry      Component Value Date/Time   NA 140 08/22/2016 1242   K 3.9 08/22/2016 1242   CL 103 08/02/2012 1107   CO2 23 08/22/2016 1242   BUN 11.8 08/22/2016 1242   CREATININE 0.9 08/22/2016 1242      Component Value Date/Time   CALCIUM 9.1 08/22/2016 1242   ALKPHOS 155 (H) 08/22/2016 1242   AST 25 08/22/2016 1242   ALT 11 08/22/2016 1242   BILITOT 0.34 08/22/2016 1242        RADIOGRAPHIC STUDIES: No results found.  ASSESSMENT AND PLAN:   This is a very pleasant 76 years old African-American male with metastatic non-small cell lung cancer, squamous cell carcinoma status post 6 cycles of systemic chemotherapy with carbo platinum and paclitaxel and has been on observation for more than he but unfortunately recent CT scan of the chest, abdomen and pelvis showed evidence for disease progression in the several metastatic liver lesions. He was started on second line treatment with Tecentriq (Atezolizumab) status post 1 cycle. He tolerated the first cycle of his treatment well. I recommended for him to proceed with cycle #2 today as a scheduled. I will see him back for follow-up visit in 3 weeks for evaluation before starting cycle #3 and a repeat CT scan of the chest, abdomen and pelvis after cycle #3 for restaging of his disease. He was advised to call immediately if he has any concerning symptoms in the interval. The patient voices understanding of current disease status and treatment  options and is in agreement with the current care plan.  All questions were answered. The patient knows to call the clinic with any problems, questions or concerns. We can certainly see the patient much sooner if necessary. I spent 10 minutes counseling the patient face to face. The total time spent in the appointment was 15 minutes.  Disclaimer: This note was dictated with voice recognition software. Similar sounding words can inadvertently be transcribed and may not be corrected upon review.

## 2016-09-12 NOTE — Patient Instructions (Signed)
[facility] Discharge Instructions for Patients Receiving Chemotherapy  Today you received the following chemotherapy agents: Tecentriq  To help prevent nausea and vomiting after your treatment, we encourage you to take your nausea medication as directed.    If you develop nausea and vomiting that is not controlled by your nausea medication, call the clinic.   BELOW ARE SYMPTOMS THAT SHOULD BE REPORTED IMMEDIATELY:  *FEVER GREATER THAN 100.5 F  *CHILLS WITH OR WITHOUT FEVER  NAUSEA AND VOMITING THAT IS NOT CONTROLLED WITH YOUR NAUSEA MEDICATION  *UNUSUAL SHORTNESS OF BREATH  *UNUSUAL BRUISING OR BLEEDING  TENDERNESS IN MOUTH AND THROAT WITH OR WITHOUT PRESENCE OF ULCERS  *URINARY PROBLEMS  *BOWEL PROBLEMS  UNUSUAL RASH Items with * indicate a potential emergency and should be followed up as soon as possible.  Feel free to call the clinic you have any questions or concerns. The clinic phone number is [phone].  Please show the CHEMO ALERT CARD at check-in to the Emergency Department and triage nurse.   

## 2016-09-13 ENCOUNTER — Telehealth: Payer: Self-pay | Admitting: Internal Medicine

## 2016-09-13 NOTE — Telephone Encounter (Signed)
Per 09/12/2016 - appts already scheduled- no additional appts needed .

## 2016-09-28 ENCOUNTER — Encounter: Payer: Self-pay | Admitting: Internal Medicine

## 2016-09-28 NOTE — Progress Notes (Signed)
Pt's wife called inquiring about copay assistance for Tecentriq.  I informed her unfortunately there aren't any foundations offering copay assistance for the type of ins and Dx he has.  I informed her of the [employer] program that he can apply for once his balance gets to $5,000 and advised she apply for the [employer] card and verified the billing department's phone number.  She verbalized understanding.

## 2016-09-29 ENCOUNTER — Telehealth: Payer: Self-pay | Admitting: Internal Medicine

## 2016-09-29 DIAGNOSIS — C329 Malignant neoplasm of larynx, unspecified: Secondary | ICD-10-CM

## 2016-09-29 DIAGNOSIS — K769 Liver disease, unspecified: Secondary | ICD-10-CM

## 2016-09-29 DIAGNOSIS — E785 Hyperlipidemia, unspecified: Secondary | ICD-10-CM

## 2016-09-29 MED ORDER — SIMVASTATIN 20 MG PO TABS: 20.0000 mg | ORAL_TABLET | Freq: Every evening | ORAL | 3 refills | Status: DC

## 2016-09-29 NOTE — Telephone Encounter (Signed)
Pt needs a refill of simvastatin (ZOCOR) 20 MG tablet.   [place]

## 2016-10-03 ENCOUNTER — Telehealth: Payer: Self-pay | Admitting: Internal Medicine

## 2016-10-03 ENCOUNTER — Ambulatory Visit (HOSPITAL_BASED_OUTPATIENT_CLINIC_OR_DEPARTMENT_OTHER): Payer: Medicare Other | Admitting: Internal Medicine

## 2016-10-03 ENCOUNTER — Encounter: Payer: Self-pay | Admitting: Internal Medicine

## 2016-10-03 ENCOUNTER — Ambulatory Visit (HOSPITAL_BASED_OUTPATIENT_CLINIC_OR_DEPARTMENT_OTHER): Payer: Medicare Other

## 2016-10-03 ENCOUNTER — Other Ambulatory Visit (HOSPITAL_BASED_OUTPATIENT_CLINIC_OR_DEPARTMENT_OTHER): Payer: Medicare Other

## 2016-10-03 VITALS — BP 103/65 | HR 93 | Temp 98.8°F | Resp 16 | Ht 66.0 in

## 2016-10-03 DIAGNOSIS — Z8521 Personal history of malignant neoplasm of larynx: Secondary | ICD-10-CM

## 2016-10-03 DIAGNOSIS — Z79899 Other long term (current) drug therapy: Secondary | ICD-10-CM

## 2016-10-03 DIAGNOSIS — Z5112 Encounter for antineoplastic immunotherapy: Secondary | ICD-10-CM

## 2016-10-03 DIAGNOSIS — C3411 Malignant neoplasm of upper lobe, right bronchus or lung: Secondary | ICD-10-CM

## 2016-10-03 DIAGNOSIS — C787 Secondary malignant neoplasm of liver and intrahepatic bile duct: Secondary | ICD-10-CM

## 2016-10-03 DIAGNOSIS — C329 Malignant neoplasm of larynx, unspecified: Secondary | ICD-10-CM

## 2016-10-03 DIAGNOSIS — Z923 Personal history of irradiation: Secondary | ICD-10-CM | POA: Diagnosis not present

## 2016-10-03 LAB — CBC WITH DIFFERENTIAL/PLATELET
BASO%: 0.8 % (ref 0.0–2.0)
BASOS ABS: 0.1 10*3/uL (ref 0.0–0.1)
EOS%: 1.1 % (ref 0.0–7.0)
Eosinophils Absolute: 0.1 10*3/uL (ref 0.0–0.5)
HEMATOCRIT: 34.3 % — AB (ref 38.4–49.9)
HEMOGLOBIN: 10.8 g/dL — AB (ref 13.0–17.1)
LYMPH#: 0.8 10*3/uL — AB (ref 0.9–3.3)
LYMPH%: 10.2 % — ABNORMAL LOW (ref 14.0–49.0)
MCH: 21.2 pg — AB (ref 27.2–33.4)
MCHC: 31.4 g/dL — ABNORMAL LOW (ref 32.0–36.0)
MCV: 67.6 fL — ABNORMAL LOW (ref 79.3–98.0)
MONO#: 0.5 10*3/uL (ref 0.1–0.9)
MONO%: 7 % (ref 0.0–14.0)
NEUT%: 80.9 % — ABNORMAL HIGH (ref 39.0–75.0)
NEUTROS ABS: 6.3 10*3/uL (ref 1.5–6.5)
Platelets: 244 10*3/uL (ref 140–400)
RBC: 5.07 10*6/uL (ref 4.20–5.82)
RDW: 20.3 % — AB (ref 11.0–14.6)
WBC: 7.8 10*3/uL (ref 4.0–10.3)

## 2016-10-03 LAB — COMPREHENSIVE METABOLIC PANEL
ALBUMIN: 3.3 g/dL — AB (ref 3.5–5.0)
ALK PHOS: 173 U/L — AB (ref 40–150)
ALT: 14 U/L (ref 0–55)
AST: 28 U/L (ref 5–34)
Anion Gap: 11 mEq/L (ref 3–11)
BILIRUBIN TOTAL: 0.44 mg/dL (ref 0.20–1.20)
BUN: 11.8 mg/dL (ref 7.0–26.0)
CALCIUM: 9.1 mg/dL (ref 8.4–10.4)
CO2: 25 mEq/L (ref 22–29)
CREATININE: 0.8 mg/dL (ref 0.7–1.3)
Chloride: 106 mEq/L (ref 98–109)
EGFR: >90 mL/min/{1.73_m2} (ref >90)
Glucose: 106 mg/dl (ref 70–140)
POTASSIUM: 3.9 meq/L (ref 3.5–5.1)
Sodium: 142 mEq/L (ref 136–145)
TOTAL PROTEIN: 7.3 g/dL (ref 6.4–8.3)

## 2016-10-03 LAB — TSH: TSH: 3.291 m[IU]/L (ref 0.320–4.118)

## 2016-10-03 MED ORDER — SODIUM CHLORIDE 0.9 % IV SOLN
1200.0000 mg | Freq: Once | INTRAVENOUS | Status: AC
  Administered 2016-10-03: 1200 mg via INTRAVENOUS
  Filled 2016-10-03: qty 20

## 2016-10-03 MED ORDER — SODIUM CHLORIDE 0.9 % IV SOLN
Freq: Once | INTRAVENOUS | Status: AC
  Administered 2016-10-03: 15:00:00 via INTRAVENOUS

## 2016-10-03 NOTE — Progress Notes (Signed)
[place] [phone]   Fax:(336) [place], [place] [address]  PRINCIPAL DIAGNOSES:  1. 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 2.  metastatic non-small cell lung cancer initially diagnosed as Stage IA non-small-cell lung cancer diagnosed in June 2011.  PRIOR THERAPY:  1. Status post total laryngectomy with. Bilateral selective lymph node dissection as well as left superior parotidectomy with nerve dissection under the care of Dr. Redmond Baseman with positive resection margin. 2. Status post course of concurrent chemoradiation with weekly cisplatin. Last dose of chemotherapy was given January 08, 2007. 3. Status post curative radiotherapy with tomotherapy to the lung lesions completed December 31, 2009 under the care of Dr. Pablo Ledger. 4. Systemic chemotherapy with carboplatin for an AUC of 5 and paclitaxel 175 mg meter squared. Status post 6 cycles.   CURRENT THERAPY: Treatment with immunotherapy with Tecentriq (Atezolizumab) 1200 MG IV every 3 weeks. First dose 08/22/2016. Status post 2 cycles.  INTERVAL HISTORY: [name] 76 y.o. male returns to the clinic today for follow-up visit. The patient is feeling fine today with no specific complaints except for fatigue. He is tolerating his current treatment with Tecentriq [name]) fairly well with no significant adverse effects. He denied having any skin rash or diarrhea. He has no chest pain but continues to have shortness of breath with exertion with no cough or hemoptysis. He denied having any fever or chills. He has no nausea, vomiting, diarrhea or constipation. He is here today for evaluation before starting cycle #3.   MEDICAL HISTORY: Past Medical History:  Diagnosis Date  . Cancer ([place]) 2009   SUPERGLOTTIS INVASIVE SQ CELL CA  . Deficiency anemia 08/15/2016  . Encounter for  antineoplastic immunotherapy 08/15/2016  . Full code status 03/17/2015  . Goals of care, counseling/discussion 08/15/2016  . Hypertension   . Stroke [facility]) 2002   left side weakness    ALLERGIES:  has No Known Allergies.  MEDICATIONS:  Current Outpatient Prescriptions  Medication Sig Dispense Refill  . albuterol (PROVENTIL) (2.5 MG/3ML) 0.083% nebulizer solution Take 3 mLs (2.5 mg total) by nebulization every 6 (six) hours as needed for wheezing or shortness of breath. 360 mL 5  . allopurinol (ZYLOPRIM) 100 MG tablet Take 1 tablet (100 mg total) by mouth daily. 90 tablet 3  . amLODipine (NORVASC) 5 MG tablet Take 1 tablet (5 mg total) by mouth at bedtime. 90 tablet 3  . aspirin 81 MG tablet Take 81 mg by mouth every morning.     . baclofen (LIORESAL) 10 MG tablet Take 1 tablet (10 mg total) by mouth at bedtime. 90 each 3  . cholecalciferol (VITAMIN D) 1000 UNITS tablet Take 1,000 Units by mouth every morning.     . colchicine 0.6 MG tablet Take 1 tablet (0.6 mg total) by mouth 2 (two) times daily. 60 tablet 11  . diphenhydrAMINE (BENADRYL) 25 MG tablet Take 50 mg by mouth every 8 (eight) hours as needed.    . finasteride (PROSCAR) 5 MG tablet Take 1 tablet (5 mg total) by mouth every morning. 90 tablet 3  . loratadine (CLARITIN) 10 MG tablet Take 10 mg by mouth daily.    . metoprolol succinate (TOPROL-XL) 25 MG 24 hr tablet Take 1 tablet (25 mg total) by mouth every morning. 90 tablet 3  . montelukast (SINGULAIR) 10 MG tablet Take 1 tablet (10  mg total) by mouth at bedtime. 90 tablet 1  . simvastatin (ZOCOR) 20 MG tablet Take 1 tablet (20 mg total) by mouth every evening. 90 tablet 3  . tadalafil (CIALIS) 5 MG tablet Take 1 tablet (5 mg total) by mouth daily. 90 tablet 3  . tiZANidine (ZANAFLEX) 4 MG tablet Take 1 tablet (4 mg total) by mouth 3 (three) times daily as needed for muscle spasms. 90 tablet 1   No current facility-administered medications for this visit.     SURGICAL HISTORY:    Past Surgical History:  Procedure Laterality Date  . TRACHEOSTOMY  2009    REVIEW OF SYSTEMS:  A comprehensive review of systems was negative except for: Constitutional: positive for fatigue Respiratory: positive for dyspnea on exertion Musculoskeletal: positive for muscle weakness   PHYSICAL EXAMINATION: General appearance: alert, cooperative, fatigued and no distress Head: Normocephalic, without obvious abnormality, atraumatic Neck: no adenopathy, no JVD, supple, symmetrical, trachea midline and thyroid not enlarged, symmetric, no tenderness/mass/nodules Lymph nodes: Cervical, supraclavicular, and axillary nodes normal. Resp: clear to auscultation bilaterally Back: symmetric, no curvature. ROM normal. No CVA tenderness. Cardio: regular rate and rhythm, S1, S2 normal, no murmur, click, rub or gallop GI: soft, non-tender; bowel sounds normal; no masses,  no organomegaly Extremities: extremities normal, atraumatic, no cyanosis or edema  ECOG PERFORMANCE STATUS: 1 - Symptomatic but completely ambulatory  Blood pressure 103/65, pulse 93, temperature 98.8 F (37.1 C), temperature source Oral, resp. rate 16, height '5\' 6"'$  (1.676 m), SpO2 98 %.  LABORATORY DATA: Lab Results  Component Value Date   WBC 7.8 10/03/2016   HGB 10.8 (L) 10/03/2016   HCT 34.3 (L) 10/03/2016   MCV 67.6 (L) 10/03/2016   PLT 244 10/03/2016      Chemistry      Component Value Date/Time   NA 139 09/12/2016 1314   K 3.9 09/12/2016 1314   CL 103 08/02/2012 1107   CO2 22 09/12/2016 1314   BUN 11.6 09/12/2016 1314   CREATININE 0.8 09/12/2016 1314      Component Value Date/Time   CALCIUM 8.8 09/12/2016 1314   ALKPHOS 178 (H) 09/12/2016 1314   AST 26 09/12/2016 1314   ALT 16 09/12/2016 1314   BILITOT 0.43 09/12/2016 1314       RADIOGRAPHIC STUDIES: No results found.  ASSESSMENT AND PLAN:   This is a very pleasant 76 years old African American male with metastatic non-small cell lung cancer,  squamous cell carcinoma with significant liver metastasis. He is status post 6 cycles of systemic chemotherapy with carboplatin and paclitaxel and has been observation for a while before his recent CT scan of the chest, abdomen and pelvis showed significant disease progression in the liver. The patient is currently on treatment with immunotherapy with Tecentriq [name]) status post 2 cycles. He has been tolerating the treatment well with no significant adverse effects. I recommended for the patient to proceed with cycle #3 today as scheduled. I will see him back for follow-up visit in 3 weeks for evaluation after repeating CT scan of the chest, abdomen and pelvis for restaging of his disease. He was advised to call immediately if he has any concerning symptoms in the interval. The patient voices understanding of current disease status and treatment options and is in agreement with the current care plan. All questions were answered. The patient knows to call the clinic with any problems, questions or concerns. We can certainly see the patient much sooner if necessary. I spent 10 minutes  counseling the patient face to face. The total time spent in the appointment was 15 minutes. Disclaimer: This note was dictated with voice recognition software. Similar sounding words can inadvertently be transcribed and may not be corrected upon review.

## 2016-10-03 NOTE — Patient Instructions (Signed)
[facility] Discharge Instructions for Patients Receiving Chemotherapy  Today you received the following chemotherapy agents Tecentriq To help prevent nausea and vomiting after your treatment, we encourage you to take your nausea medication as prescribed.   If you develop nausea and vomiting that is not controlled by your nausea medication, call the clinic.   BELOW ARE SYMPTOMS THAT SHOULD BE REPORTED IMMEDIATELY:  *FEVER GREATER THAN 100.5 F  *CHILLS WITH OR WITHOUT FEVER  NAUSEA AND VOMITING THAT IS NOT CONTROLLED WITH YOUR NAUSEA MEDICATION  *UNUSUAL SHORTNESS OF BREATH  *UNUSUAL BRUISING OR BLEEDING  TENDERNESS IN MOUTH AND THROAT WITH OR WITHOUT PRESENCE OF ULCERS  *URINARY PROBLEMS  *BOWEL PROBLEMS  UNUSUAL RASH Items with * indicate a potential emergency and should be followed up as soon as possible.  Feel free to call the clinic you have any questions or concerns. The clinic phone number is [phone].  Please show the CHEMO ALERT CARD at check-in to the Emergency Department and triage nurse.   

## 2016-10-03 NOTE — Telephone Encounter (Signed)
Gave patient AVS and calender per 4/24 los.

## 2016-10-09 MED FILL — MONTELUKAST SOD 10 MG TAB: 10 | 90 days supply | Qty: 90 | Fill #1

## 2016-10-19 ENCOUNTER — Encounter (HOSPITAL_COMMUNITY): Payer: Self-pay

## 2016-10-19 ENCOUNTER — Ambulatory Visit (HOSPITAL_COMMUNITY)
Admission: RE | Admit: 2016-10-19 | Discharge: 2016-10-19 | Disposition: A | Discharge status: Home / Self Care | Payer: Medicare Other | Source: Ambulatory Visit | Attending: Internal Medicine | Admitting: Internal Medicine

## 2016-10-19 DIAGNOSIS — C787 Secondary malignant neoplasm of liver and intrahepatic bile duct: Secondary | ICD-10-CM | POA: Insufficient documentation

## 2016-10-19 DIAGNOSIS — I714 Abdominal aortic aneurysm, without rupture: Secondary | ICD-10-CM | POA: Insufficient documentation

## 2016-10-19 DIAGNOSIS — C329 Malignant neoplasm of larynx, unspecified: Secondary | ICD-10-CM | POA: Diagnosis present

## 2016-10-19 DIAGNOSIS — Z5112 Encounter for antineoplastic immunotherapy: Secondary | ICD-10-CM | POA: Insufficient documentation

## 2016-10-19 DIAGNOSIS — C3411 Malignant neoplasm of upper lobe, right bronchus or lung: Secondary | ICD-10-CM | POA: Diagnosis not present

## 2016-10-19 MED ORDER — IOPAMIDOL (ISOVUE-300) INJECTION 61%
100.0000 mL | Freq: Once | INTRAVENOUS | Status: AC | PRN
  Administered 2016-10-19: 100 mL via INTRAVENOUS

## 2016-10-19 MED ORDER — IOPAMIDOL (ISOVUE-300) INJECTION 61%
INTRAVENOUS | Status: AC
  Filled 2016-10-19: qty 100

## 2016-10-24 ENCOUNTER — Encounter: Payer: Self-pay | Admitting: Internal Medicine

## 2016-10-24 ENCOUNTER — Ambulatory Visit (HOSPITAL_BASED_OUTPATIENT_CLINIC_OR_DEPARTMENT_OTHER): Payer: Medicare Other

## 2016-10-24 ENCOUNTER — Ambulatory Visit (HOSPITAL_BASED_OUTPATIENT_CLINIC_OR_DEPARTMENT_OTHER): Payer: Medicare Other | Admitting: Internal Medicine

## 2016-10-24 ENCOUNTER — Other Ambulatory Visit (HOSPITAL_BASED_OUTPATIENT_CLINIC_OR_DEPARTMENT_OTHER): Payer: Medicare Other

## 2016-10-24 VITALS — BP 127/79 | HR 99 | Temp 98.3°F | Resp 18 | Ht 66.0 in | Wt 146.6 lb

## 2016-10-24 DIAGNOSIS — Z79899 Other long term (current) drug therapy: Secondary | ICD-10-CM | POA: Diagnosis not present

## 2016-10-24 DIAGNOSIS — Z5112 Encounter for antineoplastic immunotherapy: Secondary | ICD-10-CM

## 2016-10-24 DIAGNOSIS — C787 Secondary malignant neoplasm of liver and intrahepatic bile duct: Secondary | ICD-10-CM

## 2016-10-24 DIAGNOSIS — I10 Essential (primary) hypertension: Secondary | ICD-10-CM | POA: Diagnosis not present

## 2016-10-24 DIAGNOSIS — J449 Chronic obstructive pulmonary disease, unspecified: Secondary | ICD-10-CM | POA: Diagnosis not present

## 2016-10-24 DIAGNOSIS — C3491 Malignant neoplasm of unspecified part of right bronchus or lung: Secondary | ICD-10-CM | POA: Diagnosis not present

## 2016-10-24 DIAGNOSIS — C3411 Malignant neoplasm of upper lobe, right bronchus or lung: Secondary | ICD-10-CM

## 2016-10-24 LAB — COMPREHENSIVE METABOLIC PANEL WITH GFR
ALT: 16 U/L (ref 0–55)
AST: 34 U/L (ref 5–34)
Albumin: 3.5 g/dL (ref 3.5–5.0)
Alkaline Phosphatase: 153 U/L — ABNORMAL HIGH (ref 40–150)
Anion Gap: 11 meq/L (ref 3–11)
BUN: 14.6 mg/dL (ref 7.0–26.0)
CO2: 26 meq/L (ref 22–29)
Calcium: 9.1 mg/dL (ref 8.4–10.4)
Chloride: 107 meq/L (ref 98–109)
Creatinine: 0.8 mg/dL (ref 0.7–1.3)
EGFR: >90 mL/min/{1.73_m2}
Glucose: 99 mg/dL (ref 70–140)
Potassium: 4 meq/L (ref 3.5–5.1)
Sodium: 144 meq/L (ref 136–145)
Total Bilirubin: 0.41 mg/dL (ref 0.20–1.20)
Total Protein: 7.4 g/dL (ref 6.4–8.3)

## 2016-10-24 LAB — CBC WITH DIFFERENTIAL/PLATELET
BASO%: 0.6 % (ref 0.0–2.0)
BASOS ABS: 0 10*3/uL (ref 0.0–0.1)
EOS%: 1.8 % (ref 0.0–7.0)
Eosinophils Absolute: 0.1 10*3/uL (ref 0.0–0.5)
HCT: 34 % — ABNORMAL LOW (ref 38.4–49.9)
HGB: 10.3 g/dL — ABNORMAL LOW (ref 13.0–17.1)
LYMPH%: 12.1 % — AB (ref 14.0–49.0)
MCH: 21.3 pg — AB (ref 27.2–33.4)
MCHC: 30.3 g/dL — ABNORMAL LOW (ref 32.0–36.0)
MCV: 70.2 fL — AB (ref 79.3–98.0)
MONO#: 0.6 10*3/uL (ref 0.1–0.9)
MONO%: 9.4 % (ref 0.0–14.0)
NEUT%: 76.1 % — ABNORMAL HIGH (ref 39.0–75.0)
NEUTROS ABS: 5.2 10*3/uL (ref 1.5–6.5)
Platelets: 175 10*3/uL (ref 140–400)
RBC: 4.84 10*6/uL (ref 4.20–5.82)
RDW: 18.7 % — ABNORMAL HIGH (ref 11.0–14.6)
WBC: 6.8 10*3/uL (ref 4.0–10.3)
lymph#: 0.8 10*3/uL — ABNORMAL LOW (ref 0.9–3.3)

## 2016-10-24 LAB — TSH: TSH: 3.52 m(IU)/L (ref 0.320–4.118)

## 2016-10-24 MED ORDER — SODIUM CHLORIDE 0.9 % IV SOLN
1200.0000 mg | Freq: Once | INTRAVENOUS | Status: AC
  Administered 2016-10-24: 1200 mg via INTRAVENOUS
  Filled 2016-10-24: qty 20

## 2016-10-24 MED ORDER — SODIUM CHLORIDE 0.9 % IV SOLN
Freq: Once | INTRAVENOUS | Status: AC
  Administered 2016-10-24: 15:00:00 via INTRAVENOUS

## 2016-10-24 NOTE — Patient Instructions (Signed)
[facility] Discharge Instructions for Patients Receiving Chemotherapy  Today you received the following chemotherapy agents Tecentriq To help prevent nausea and vomiting after your treatment, we encourage you to take your nausea medication as prescribed.   If you develop nausea and vomiting that is not controlled by your nausea medication, call the clinic.   BELOW ARE SYMPTOMS THAT SHOULD BE REPORTED IMMEDIATELY:  *FEVER GREATER THAN 100.5 F  *CHILLS WITH OR WITHOUT FEVER  NAUSEA AND VOMITING THAT IS NOT CONTROLLED WITH YOUR NAUSEA MEDICATION  *UNUSUAL SHORTNESS OF BREATH  *UNUSUAL BRUISING OR BLEEDING  TENDERNESS IN MOUTH AND THROAT WITH OR WITHOUT PRESENCE OF ULCERS  *URINARY PROBLEMS  *BOWEL PROBLEMS  UNUSUAL RASH Items with * indicate a potential emergency and should be followed up as soon as possible.  Feel free to call the clinic you have any questions or concerns. The clinic phone number is [phone].  Please show the CHEMO ALERT CARD at check-in to the Emergency Department and triage nurse.   

## 2016-10-24 NOTE — Progress Notes (Signed)
[place] [phone]) [phone]   [phone]) [phone]  OFFICE PROGRESS NOTE  [name], MD [place] [address]  PRINCIPAL DIAGNOSES:  1. 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 2.  metastatic non-small cell lung cancer initially diagnosed as Stage IA non-small-cell lung cancer diagnosed in June 2011.  PRIOR THERAPY:  1. Status post total laryngectomy with. Bilateral selective lymph node dissection as well as left superior parotidectomy with nerve dissection under the care of Dr. Redmond Baseman with positive resection margin. 2. Status post course of concurrent chemoradiation with weekly cisplatin. Last dose of chemotherapy was given January 08, 2007. 3. Status post curative radiotherapy with tomotherapy to the lung lesions completed December 31, 2009 under the care of Dr. Pablo Ledger. 4. Systemic chemotherapy with carboplatin for an AUC of 5 and paclitaxel 175 mg meter squared. Status post 6 cycles.   CURRENT THERAPY: Treatment with immunotherapy with Tecentriq (Atezolizumab) 1200 MG IV every 3 weeks. First dose 08/22/2016. Status post 3 cycles.  INTERVAL HISTORY: THAER MIYOSHI 76 y.o. male hypertensive the clinic today for follow-up visit. The patient is feeling fine today with no specific complaints. He has been tolerating his treatment with immunotherapy with Tecentriq [name]) fairly well. He denied having any chest pain, shortness of breath except with exertion and no cough or hemoptysis. He has no nausea, vomiting, diarrhea or constipation. He denied having any skin rash. He has no headache or visual changes. The patient had repeat CT scan of the chest, abdomen and pelvis performed recently and he is here for evaluation and discussion of his scan results.   MEDICAL HISTORY: Past Medical History:  Diagnosis Date  . Cancer ([place]) 2009   SUPERGLOTTIS INVASIVE SQ CELL CA  . Deficiency anemia  08/15/2016  . Encounter for antineoplastic immunotherapy 08/15/2016  . Full code status 03/17/2015  . Goals of care, counseling/discussion 08/15/2016  . Hypertension   . Stroke [facility]) 2002   left side weakness    ALLERGIES:  has No Known Allergies.  MEDICATIONS:  Current Outpatient Prescriptions  Medication Sig Dispense Refill  . albuterol (PROVENTIL) (2.5 MG/3ML) 0.083% nebulizer solution Take 3 mLs (2.5 mg total) by nebulization every 6 (six) hours as needed for wheezing or shortness of breath. 360 mL 5  . allopurinol (ZYLOPRIM) 100 MG tablet Take 1 tablet (100 mg total) by mouth daily. 90 tablet 3  . amLODipine (NORVASC) 5 MG tablet Take 1 tablet (5 mg total) by mouth at bedtime. 90 tablet 3  . aspirin 81 MG tablet Take 81 mg by mouth every morning.     . baclofen (LIORESAL) 10 MG tablet Take 1 tablet (10 mg total) by mouth at bedtime. 90 each 3  . cholecalciferol (VITAMIN D) 1000 UNITS tablet Take 1,000 Units by mouth every morning.     . colchicine 0.6 MG tablet Take 1 tablet (0.6 mg total) by mouth 2 (two) times daily. 60 tablet 11  . diphenhydrAMINE (BENADRYL) 25 MG tablet Take 50 mg by mouth every 8 (eight) hours as needed.    . finasteride (PROSCAR) 5 MG tablet Take 1 tablet (5 mg total) by mouth every morning. 90 tablet 3  . loratadine (CLARITIN) 10 MG tablet Take 10 mg by mouth daily.    . metoprolol succinate (TOPROL-XL) 25 MG 24 hr tablet Take 1 tablet (25 mg total) by mouth every morning. 90 tablet 3  . montelukast (SINGULAIR) 10 MG  tablet Take 1 tablet (10 mg total) by mouth at bedtime. 90 tablet 1  . simvastatin (ZOCOR) 20 MG tablet Take 1 tablet (20 mg total) by mouth every evening. 90 tablet 3  . tadalafil (CIALIS) 5 MG tablet Take 1 tablet (5 mg total) by mouth daily. 90 tablet 3  . tiZANidine (ZANAFLEX) 4 MG tablet Take 1 tablet (4 mg total) by mouth 3 (three) times daily as needed for muscle spasms. 90 tablet 1   No current facility-administered medications for this visit.      SURGICAL HISTORY:  Past Surgical History:  Procedure Laterality Date  . TRACHEOSTOMY  2009    REVIEW OF SYSTEMS:  Constitutional: positive for fatigue Eyes: negative Ears, nose, mouth, throat, and face: negative Respiratory: positive for dyspnea on exertion Cardiovascular: negative Gastrointestinal: negative Genitourinary:negative Integument/breast: negative Hematologic/lymphatic: negative Musculoskeletal:positive for muscle weakness Neurological: negative Behavioral/Psych: negative Endocrine: negative Allergic/Immunologic: negative   PHYSICAL EXAMINATION: General appearance: alert, cooperative, fatigued and no distress Head: Normocephalic, without obvious abnormality, atraumatic Neck: no adenopathy, no JVD, supple, symmetrical, trachea midline and thyroid not enlarged, symmetric, no tenderness/mass/nodules Lymph nodes: Cervical, supraclavicular, and axillary nodes normal. Resp: clear to auscultation bilaterally Back: symmetric, no curvature. ROM normal. No CVA tenderness. Cardio: regular rate and rhythm, S1, S2 normal, no murmur, click, rub or gallop GI: soft, non-tender; bowel sounds normal; no masses,  no organomegaly Extremities: extremities normal, atraumatic, no cyanosis or edema Neurologic: Alert and oriented X 3, normal strength and tone. Normal symmetric reflexes. Normal coordination and gait  ECOG PERFORMANCE STATUS: 1 - Symptomatic but completely ambulatory  Blood pressure 127/79, pulse 99, temperature 98.3 F (36.8 C), temperature source Oral, resp. rate 18, height '5\' 6"'$  (1.676 m), weight 146 lb 9.6 oz (66.5 kg), SpO2 98 %.  LABORATORY DATA: Lab Results  Component Value Date   WBC 6.8 10/24/2016   HGB 10.3 (L) 10/24/2016   HCT 34.0 (L) 10/24/2016   MCV 70.2 (L) 10/24/2016   PLT 175 10/24/2016      Chemistry      Component Value Date/Time   NA 142 10/03/2016 1258   K 3.9 10/03/2016 1258   CL 103 08/02/2012 1107   CO2 25 10/03/2016 1258   BUN 11.8  10/03/2016 1258   CREATININE 0.8 10/03/2016 1258      Component Value Date/Time   CALCIUM 9.1 10/03/2016 1258   ALKPHOS 173 (H) 10/03/2016 1258   AST 28 10/03/2016 1258   ALT 14 10/03/2016 1258   BILITOT 0.44 10/03/2016 1258       RADIOGRAPHIC STUDIES: Ct Chest W Contrast  Result Date: 10/19/2016 CLINICAL DATA:  Stage IV supraglottic invasive squamous cell carcinoma (2008) with non-small-cell lung cancer (2011) EXAM: CT CHEST, ABDOMEN, AND PELVIS WITH CONTRAST TECHNIQUE: Multidetector CT imaging of the chest, abdomen and pelvis was performed following the standard protocol during bolus administration of intravenous contrast. CONTRAST:  188m ISOVUE-300 IOPAMIDOL (ISOVUE-300) INJECTION 61% COMPARISON:  08/09/2016 FINDINGS: CT CHEST FINDINGS Cardiovascular: The heart size is normal. No pericardial effusion. Coronary artery calcification is noted. Atherosclerotic calcification is noted in the wall of the thoracic aorta. Mediastinum/Nodes: No mediastinal lymphadenopathy. There is no hilar lymphadenopathy. Patulous mid and distal esophagus, as before. Lungs/Pleura: Emphysema. 7 mm posterior left upper lobe pulmonary nodule is stable. Architectural distortion with volume loss right upper lobe is associated with stable 2.1 x 2.8 cm nodule. The 5 mm right middle lobe pulmonary nodule is unchanged. Musculoskeletal: Bone windows reveal no worrisome lytic or sclerotic osseous lesions. CT  ABDOMEN PELVIS FINDINGS Hepatobiliary: Multiple liver metastases are again noted. Lesion in the dome of the left liver measured previously 2.6 x 3.1 cm now measures 2.6 x 3.1 cm. Right liver lesion towards the dome measured previously at 16 mm is 15 mm today. Previously measured discrete lesion lateral segment left liver at 11 mm is 10 mm today. Lesion identified in the posterior aspect of the inferior right liver and measured previously at 3.4 x 4.2 cm now measures 3.5 x 4.8 cm. Inferior right liver lesion measured previously  at 4.1 cm now measures 4.1 cm. Pancreas: No focal mass lesion. No dilatation of the main duct. No intraparenchymal cyst. Pancreatic head and body are unremarkable. Fullness in the tail the pancreas is associated with calcification, similar to prior. This fullness extends into the splenic hilum as before. Spleen: No splenomegaly. No focal mass lesion. Adrenals/Urinary Tract: No adrenal nodule or mass. Left lower pole renal cyst is stable. Right kidney unremarkable. No evidence for hydroureter. Mild circumferential bladder wall thickening. Stomach/Bowel: Stomach is nondistended. No gastric wall thickening. No evidence of outlet obstruction. Duodenum is normally positioned as is the ligament of Treitz. No small bowel wall thickening. No small bowel dilatation. The terminal ileum is normal. The appendix is normal. Diverticular changes are noted in the left colon without evidence of diverticulitis. Vascular/Lymphatic: 4.7 cm abdominal aortic aneurysm is stable when I measure it in the same dimension and at the same level on the prior study. There is no gastrohepatic or hepatoduodenal ligament lymphadenopathy. No intraperitoneal or retroperitoneal lymphadenopathy. No pelvic sidewall lymphadenopathy. Reproductive: The prostate gland and seminal vesicles have normal imaging features. Other: No intraperitoneal free fluid. Musculoskeletal: Bone windows reveal no worrisome lytic or sclerotic osseous lesions. IMPRESSION: 1. Stable exam without general trend towards decreasing or worsening disease. 2. 3 previously described pulmonary nodules show no appreciable interval change. 3. Multiple hepatic metastases as before. A 8 lesion in the posterior aspect of the right liver measures minimally larger on today's study but the remaining lesions are stable. 4. 4.7 cm abdominal aortic aneurysm. 5. Stable soft tissue fullness in the pancreatic tail. Electronically Signed   By: Misty Stanley M.D.   On: 10/19/2016 16:19   Ct Abdomen  Pelvis W Contrast  Result Date: 10/19/2016 CLINICAL DATA:  Stage IV supraglottic invasive squamous cell carcinoma (2008) with non-small-cell lung cancer (2011) EXAM: CT CHEST, ABDOMEN, AND PELVIS WITH CONTRAST TECHNIQUE: Multidetector CT imaging of the chest, abdomen and pelvis was performed following the standard protocol during bolus administration of intravenous contrast. CONTRAST:  139m ISOVUE-300 IOPAMIDOL (ISOVUE-300) INJECTION 61% COMPARISON:  08/09/2016 FINDINGS: CT CHEST FINDINGS Cardiovascular: The heart size is normal. No pericardial effusion. Coronary artery calcification is noted. Atherosclerotic calcification is noted in the wall of the thoracic aorta. Mediastinum/Nodes: No mediastinal lymphadenopathy. There is no hilar lymphadenopathy. Patulous mid and distal esophagus, as before. Lungs/Pleura: Emphysema. 7 mm posterior left upper lobe pulmonary nodule is stable. Architectural distortion with volume loss right upper lobe is associated with stable 2.1 x 2.8 cm nodule. The 5 mm right middle lobe pulmonary nodule is unchanged. Musculoskeletal: Bone windows reveal no worrisome lytic or sclerotic osseous lesions. CT ABDOMEN PELVIS FINDINGS Hepatobiliary: Multiple liver metastases are again noted. Lesion in the dome of the left liver measured previously 2.6 x 3.1 cm now measures 2.6 x 3.1 cm. Right liver lesion towards the dome measured previously at 16 mm is 15 mm today. Previously measured discrete lesion lateral segment left liver at 11 mm is 10  mm today. Lesion identified in the posterior aspect of the inferior right liver and measured previously at 3.4 x 4.2 cm now measures 3.5 x 4.8 cm. Inferior right liver lesion measured previously at 4.1 cm now measures 4.1 cm. Pancreas: No focal mass lesion. No dilatation of the main duct. No intraparenchymal cyst. Pancreatic head and body are unremarkable. Fullness in the tail the pancreas is associated with calcification, similar to prior. This fullness  extends into the splenic hilum as before. Spleen: No splenomegaly. No focal mass lesion. Adrenals/Urinary Tract: No adrenal nodule or mass. Left lower pole renal cyst is stable. Right kidney unremarkable. No evidence for hydroureter. Mild circumferential bladder wall thickening. Stomach/Bowel: Stomach is nondistended. No gastric wall thickening. No evidence of outlet obstruction. Duodenum is normally positioned as is the ligament of Treitz. No small bowel wall thickening. No small bowel dilatation. The terminal ileum is normal. The appendix is normal. Diverticular changes are noted in the left colon without evidence of diverticulitis. Vascular/Lymphatic: 4.7 cm abdominal aortic aneurysm is stable when I measure it in the same dimension and at the same level on the prior study. There is no gastrohepatic or hepatoduodenal ligament lymphadenopathy. No intraperitoneal or retroperitoneal lymphadenopathy. No pelvic sidewall lymphadenopathy. Reproductive: The prostate gland and seminal vesicles have normal imaging features. Other: No intraperitoneal free fluid. Musculoskeletal: Bone windows reveal no worrisome lytic or sclerotic osseous lesions. IMPRESSION: 1. Stable exam without general trend towards decreasing or worsening disease. 2. 3 previously described pulmonary nodules show no appreciable interval change. 3. Multiple hepatic metastases as before. A 8 lesion in the posterior aspect of the right liver measures minimally larger on today's study but the remaining lesions are stable. 4. 4.7 cm abdominal aortic aneurysm. 5. Stable soft tissue fullness in the pancreatic tail. Electronically Signed   By: Misty Stanley M.D.   On: 10/19/2016 16:19    ASSESSMENT AND PLAN:   This is a very pleasant 76 years old African-American male with metastatic non-small cell lung cancer, squamous cell carcinoma with significant liver metastasis. He was treated in the past was a course of systemic chemotherapy with carboplatin and  paclitaxel and was on observation for several months before his scan showed evidence for disease progression. He is currently undergoing second line treatment with immunotherapy with Tecentriq [name]) status post 3 cycles. The patient has been tolerating his treatment well with no significant adverse effects. He had repeat CT scan of the chest, abdomen and pelvis performed recently. I personally and independently reviewed the scan images and discuss the results with the patient today. His scan showed no clear evidence for disease progression. I recommended for the patient to continue his current treatment with Tecentriq [name]) and he will start cycle #4 today. For hypertension he will continue on Norvasc and Toprol-XL. For COPD the patient is currently on treatment with single layer and albuterol inhaler. He would come back for follow-up visit in 3 weeks for evaluation before starting cycle #5. The patient was advised to call immediately if he has any concerning symptoms in the interval. The patient voices understanding of current disease status and treatment options and is in agreement with the current care plan. All questions were answered. The patient knows to call the clinic with any problems, questions or concerns. We can certainly see the patient much sooner if necessary. I spent 15 minutes counseling the patient face to face. The total time spent in the appointment was 25 minutes.  Disclaimer: This note was dictated with voice recognition  software. Similar sounding words can inadvertently be transcribed and may not be corrected upon review.

## 2016-10-27 ENCOUNTER — Telehealth: Payer: Self-pay | Admitting: Internal Medicine

## 2016-10-27 NOTE — Telephone Encounter (Signed)
Scheduled additional appt per 5/15 los. - left message for patient to pick up new schedule next visit.

## 2016-11-08 ENCOUNTER — Telehealth: Payer: Self-pay | Admitting: Internal Medicine

## 2016-11-10 MED FILL — ALBUTEROL 0.083% INHAL SOLN: (2.5 MG/3ML | 30 days supply | Qty: 360 | Fill #2

## 2016-11-13 ENCOUNTER — Ambulatory Visit: Payer: Medicare Other | Admitting: Internal Medicine

## 2016-11-14 ENCOUNTER — Ambulatory Visit (HOSPITAL_BASED_OUTPATIENT_CLINIC_OR_DEPARTMENT_OTHER): Payer: Medicare Other

## 2016-11-14 ENCOUNTER — Other Ambulatory Visit (HOSPITAL_BASED_OUTPATIENT_CLINIC_OR_DEPARTMENT_OTHER): Payer: Medicare Other

## 2016-11-14 ENCOUNTER — Encounter: Payer: Self-pay | Admitting: Internal Medicine

## 2016-11-14 ENCOUNTER — Ambulatory Visit (HOSPITAL_BASED_OUTPATIENT_CLINIC_OR_DEPARTMENT_OTHER): Payer: Medicare Other | Admitting: Internal Medicine

## 2016-11-14 ENCOUNTER — Telehealth: Payer: Self-pay | Admitting: Internal Medicine

## 2016-11-14 VITALS — BP 127/70 | HR 86 | Temp 98.6°F | Resp 18 | Ht 66.0 in | Wt 147.3 lb

## 2016-11-14 DIAGNOSIS — C3411 Malignant neoplasm of upper lobe, right bronchus or lung: Secondary | ICD-10-CM | POA: Diagnosis not present

## 2016-11-14 DIAGNOSIS — Z923 Personal history of irradiation: Secondary | ICD-10-CM

## 2016-11-14 DIAGNOSIS — Z5112 Encounter for antineoplastic immunotherapy: Secondary | ICD-10-CM

## 2016-11-14 DIAGNOSIS — C787 Secondary malignant neoplasm of liver and intrahepatic bile duct: Secondary | ICD-10-CM

## 2016-11-14 DIAGNOSIS — Z79899 Other long term (current) drug therapy: Secondary | ICD-10-CM

## 2016-11-14 DIAGNOSIS — Z8521 Personal history of malignant neoplasm of larynx: Secondary | ICD-10-CM | POA: Diagnosis not present

## 2016-11-14 DIAGNOSIS — C329 Malignant neoplasm of larynx, unspecified: Secondary | ICD-10-CM

## 2016-11-14 LAB — CBC WITH DIFFERENTIAL/PLATELET
BASO%: 1.1 % (ref 0.0–2.0)
Basophils Absolute: 0.1 10*3/uL (ref 0.0–0.1)
EOS%: 1.8 % (ref 0.0–7.0)
Eosinophils Absolute: 0.1 10*3/uL (ref 0.0–0.5)
HCT: 31.3 % — ABNORMAL LOW (ref 38.4–49.9)
HGB: 9.7 g/dL — ABNORMAL LOW (ref 13.0–17.1)
LYMPH%: 9.2 % — AB (ref 14.0–49.0)
MCH: 20.9 pg — AB (ref 27.2–33.4)
MCHC: 31.1 g/dL — AB (ref 32.0–36.0)
MCV: 67.2 fL — ABNORMAL LOW (ref 79.3–98.0)
MONO#: 0.4 10*3/uL (ref 0.1–0.9)
MONO%: 6.7 % (ref 0.0–14.0)
NEUT#: 5.1 10*3/uL (ref 1.5–6.5)
NEUT%: 81.2 % — ABNORMAL HIGH (ref 39.0–75.0)
Platelets: 216 10*3/uL (ref 140–400)
RBC: 4.65 10*6/uL (ref 4.20–5.82)
RDW: 20.3 % — ABNORMAL HIGH (ref 11.0–14.6)
WBC: 6.3 10*3/uL (ref 4.0–10.3)
lymph#: 0.6 10*3/uL — ABNORMAL LOW (ref 0.9–3.3)

## 2016-11-14 LAB — COMPREHENSIVE METABOLIC PANEL
ALT: 9 U/L (ref 0–55)
AST: 26 U/L (ref 5–34)
Albumin: 3.3 g/dL — ABNORMAL LOW (ref 3.5–5.0)
Alkaline Phosphatase: 147 U/L (ref 40–150)
Anion Gap: 9 mEq/L (ref 3–11)
BUN: 13.8 mg/dL (ref 7.0–26.0)
CHLORIDE: 107 meq/L (ref 98–109)
CO2: 25 meq/L (ref 22–29)
CREATININE: 0.8 mg/dL (ref 0.7–1.3)
Calcium: 8.9 mg/dL (ref 8.4–10.4)
EGFR: >90 mL/min/{1.73_m2} (ref >90)
Glucose: 107 mg/dl (ref 70–140)
POTASSIUM: 3.7 meq/L (ref 3.5–5.1)
Sodium: 141 mEq/L (ref 136–145)
Total Bilirubin: 0.4 mg/dL (ref 0.20–1.20)
Total Protein: 7.1 g/dL (ref 6.4–8.3)

## 2016-11-14 LAB — TSH: TSH: 2.717 m(IU)/L (ref 0.320–4.118)

## 2016-11-14 MED ORDER — SODIUM CHLORIDE 0.9 % IV SOLN
1200.0000 mg | Freq: Once | INTRAVENOUS | Status: AC
  Administered 2016-11-14: 1200 mg via INTRAVENOUS
  Filled 2016-11-14: qty 20

## 2016-11-14 MED ORDER — SODIUM CHLORIDE 0.9 % IV SOLN
Freq: Once | INTRAVENOUS | Status: AC
  Administered 2016-11-14: 11:00:00 via INTRAVENOUS

## 2016-11-14 NOTE — Telephone Encounter (Signed)
All appts already scheduled per 6/5 LOS - no additional appts added.

## 2016-11-14 NOTE — Patient Instructions (Signed)
[name] Discharge Instructions for Patients Receiving Chemotherapy  Today you received the following chemotherapy agents Tecentriq  To help prevent nausea and vomiting after your treatment, we encourage you to take your nausea medication as directed If you develop nausea and vomiting that is not controlled by your nausea medication, call the clinic.   BELOW ARE SYMPTOMS THAT SHOULD BE REPORTED IMMEDIATELY:  *FEVER GREATER THAN 100.5 F  *CHILLS WITH OR WITHOUT FEVER  NAUSEA AND VOMITING THAT IS NOT CONTROLLED WITH YOUR NAUSEA MEDICATION  *UNUSUAL SHORTNESS OF BREATH  *UNUSUAL BRUISING OR BLEEDING  TENDERNESS IN MOUTH AND THROAT WITH OR WITHOUT PRESENCE OF ULCERS  *URINARY PROBLEMS  *BOWEL PROBLEMS  UNUSUAL RASH Items with * indicate a potential emergency and should be followed up as soon as possible.  Feel free to call the clinic you have any questions or concerns. The clinic phone number is [phone].  Please show the [facility] at check-in to the Emergency Department and triage nurse.

## 2016-11-14 NOTE — Progress Notes (Signed)
[place] Telephone:(336) [phone]   Fax:(336) [phone]  OFFICE PROGRESS NOTE  Casey Koch, MD [place] [address]  PRINCIPAL DIAGNOSES:  1. 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 2.  metastatic non-small cell lung cancer initially diagnosed as Stage IA non-small-cell lung cancer diagnosed in June 2011.  PRIOR THERAPY:  1. Status post total laryngectomy with. Bilateral selective lymph node dissection as well as left superior parotidectomy with nerve dissection under the care of Dr. Redmond Baseman with positive resection margin. 2. Status post course of concurrent chemoradiation with weekly cisplatin. Last dose of chemotherapy was given January 08, 2007. 3. Status post curative radiotherapy with tomotherapy to the lung lesions completed December 31, 2009 under the care of Dr. Pablo Ledger. 4. Systemic chemotherapy with carboplatin for an AUC of 5 and paclitaxel 175 mg meter squared. Status post 6 cycles.   CURRENT THERAPY: Treatment with immunotherapy with Tecentriq (Atezolizumab) 1200 MG IV every 3 weeks. First dose 08/22/2016. Status post 4 cycles.  INTERVAL HISTORY: [name] 76 y.o. male returns to the clinic today for follow-up visit. The patient is feeling fine and tolerating his treatment fairly well. He denied having any chest pain, shortness of breath, cough or hemoptysis. He denied having any fever or chills. She has no nausea, vomiting, diarrhea or constipation. He denied having any recent weight loss or night sweats. He is here today for evaluation before starting cycle #5 of his treatment.  MEDICAL HISTORY: Past Medical History:  Diagnosis Date  . Cancer ([place]) 2009   SUPERGLOTTIS INVASIVE SQ CELL CA  . Deficiency anemia 08/15/2016  . Encounter for antineoplastic immunotherapy 08/15/2016  . Full code status 03/17/2015  . Goals of care, counseling/discussion 08/15/2016  . Hypertension   .  Stroke [facility]) 2002   left side weakness    ALLERGIES:  has No Known Allergies.  MEDICATIONS:  Current Outpatient Prescriptions  Medication Sig Dispense Refill  . albuterol (PROVENTIL) (2.5 MG/3ML) 0.083% nebulizer solution Take 3 mLs (2.5 mg total) by nebulization every 6 (six) hours as needed for wheezing or shortness of breath. 360 mL 5  . allopurinol (ZYLOPRIM) 100 MG tablet Take 1 tablet (100 mg total) by mouth daily. 90 tablet 3  . amLODipine (NORVASC) 5 MG tablet Take 1 tablet (5 mg total) by mouth at bedtime. 90 tablet 3  . aspirin 81 MG tablet Take 81 mg by mouth every morning.     . baclofen (LIORESAL) 10 MG tablet Take 1 tablet (10 mg total) by mouth at bedtime. 90 each 3  . cholecalciferol (VITAMIN D) 1000 UNITS tablet Take 1,000 Units by mouth every morning.     . colchicine 0.6 MG tablet Take 1 tablet (0.6 mg total) by mouth 2 (two) times daily. 60 tablet 11  . diphenhydrAMINE (BENADRYL) 25 MG tablet Take 50 mg by mouth every 8 (eight) hours as needed.    . finasteride (PROSCAR) 5 MG tablet Take 1 tablet (5 mg total) by mouth every morning. 90 tablet 3  . loratadine (CLARITIN) 10 MG tablet Take 10 mg by mouth daily.    . metoprolol succinate (TOPROL-XL) 25 MG 24 hr tablet Take 1 tablet (25 mg total) by mouth every morning. 90 tablet 3  . montelukast (SINGULAIR) 10 MG tablet Take 1 tablet (10 mg total) by mouth at bedtime. 90 tablet 1  . simvastatin (ZOCOR) 20 MG tablet Take 1 tablet (20 mg  total) by mouth every evening. 90 tablet 3  . tadalafil (CIALIS) 5 MG tablet Take 1 tablet (5 mg total) by mouth daily. 90 tablet 3  . tiZANidine (ZANAFLEX) 4 MG tablet Take 1 tablet (4 mg total) by mouth 3 (three) times daily as needed for muscle spasms. 90 tablet 1   No current facility-administered medications for this visit.     SURGICAL HISTORY:  Past Surgical History:  Procedure Laterality Date  . TRACHEOSTOMY  2009    REVIEW OF SYSTEMS:  A comprehensive review of systems was  negative except for: Constitutional: positive for fatigue   PHYSICAL EXAMINATION: General appearance: alert, cooperative, fatigued and no distress Head: Normocephalic, without obvious abnormality, atraumatic Neck: no adenopathy, no JVD, supple, symmetrical, trachea midline and thyroid not enlarged, symmetric, no tenderness/mass/nodules Lymph nodes: Cervical, supraclavicular, and axillary nodes normal. Resp: clear to auscultation bilaterally Back: symmetric, no curvature. ROM normal. No CVA tenderness. Cardio: regular rate and rhythm, S1, S2 normal, no murmur, click, rub or gallop GI: soft, non-tender; bowel sounds normal; no masses,  no organomegaly Extremities: extremities normal, atraumatic, no cyanosis or edema  ECOG PERFORMANCE STATUS: 1 - Symptomatic but completely ambulatory  Blood pressure 127/70, pulse 86, temperature 98.6 F (37 C), temperature source Oral, resp. rate 18, height 5\' 6"  (1.676 m), weight 147 lb 4.8 oz (66.8 kg), SpO2 96 %.  LABORATORY DATA: Lab Results  Component Value Date   WBC 6.3 11/14/2016   HGB 9.7 (L) 11/14/2016   HCT 31.3 (L) 11/14/2016   MCV 67.2 (L) 11/14/2016   PLT 216 11/14/2016      Chemistry      Component Value Date/Time   NA 141 11/14/2016 0939   K 3.7 11/14/2016 0939   CL 103 08/02/2012 1107   CO2 25 11/14/2016 0939   BUN 13.8 11/14/2016 0939   CREATININE 0.8 11/14/2016 0939      Component Value Date/Time   CALCIUM 8.9 11/14/2016 0939   ALKPHOS 147 11/14/2016 0939   AST 26 11/14/2016 0939   ALT 9 11/14/2016 0939   BILITOT 0.40 11/14/2016 0939       RADIOGRAPHIC STUDIES: Ct Chest W Contrast  Result Date: 10/19/2016 CLINICAL DATA:  Stage IV supraglottic invasive squamous cell carcinoma (2008) with non-small-cell lung cancer (2011) EXAM: CT CHEST, ABDOMEN, AND PELVIS WITH CONTRAST TECHNIQUE: Multidetector CT imaging of the chest, abdomen and pelvis was performed following the standard protocol during bolus administration of  intravenous contrast. CONTRAST:  157mL ISOVUE-300 IOPAMIDOL (ISOVUE-300) INJECTION 61% COMPARISON:  08/09/2016 FINDINGS: CT CHEST FINDINGS Cardiovascular: The heart size is normal. No pericardial effusion. Coronary artery calcification is noted. Atherosclerotic calcification is noted in the wall of the thoracic aorta. Mediastinum/Nodes: No mediastinal lymphadenopathy. There is no hilar lymphadenopathy. Patulous mid and distal esophagus, as before. Lungs/Pleura: Emphysema. 7 mm posterior left upper lobe pulmonary nodule is stable. Architectural distortion with volume loss right upper lobe is associated with stable 2.1 x 2.8 cm nodule. The 5 mm right middle lobe pulmonary nodule is unchanged. Musculoskeletal: Bone windows reveal no worrisome lytic or sclerotic osseous lesions. CT ABDOMEN PELVIS FINDINGS Hepatobiliary: Multiple liver metastases are again noted. Lesion in the dome of the left liver measured previously 2.6 x 3.1 cm now measures 2.6 x 3.1 cm. Right liver lesion towards the dome measured previously at 16 mm is 15 mm today. Previously measured discrete lesion lateral segment left liver at 11 mm is 10 mm today. Lesion identified in the posterior aspect of the inferior right liver  and measured previously at 3.4 x 4.2 cm now measures 3.5 x 4.8 cm. Inferior right liver lesion measured previously at 4.1 cm now measures 4.1 cm. Pancreas: No focal mass lesion. No dilatation of the main duct. No intraparenchymal cyst. Pancreatic head and body are unremarkable. Fullness in the tail the pancreas is associated with calcification, similar to prior. This fullness extends into the splenic hilum as before. Spleen: No splenomegaly. No focal mass lesion. Adrenals/Urinary Tract: No adrenal nodule or mass. Left lower pole renal cyst is stable. Right kidney unremarkable. No evidence for hydroureter. Mild circumferential bladder wall thickening. Stomach/Bowel: Stomach is nondistended. No gastric wall thickening. No evidence of  outlet obstruction. Duodenum is normally positioned as is the ligament of Treitz. No small bowel wall thickening. No small bowel dilatation. The terminal ileum is normal. The appendix is normal. Diverticular changes are noted in the left colon without evidence of diverticulitis. Vascular/Lymphatic: 4.7 cm abdominal aortic aneurysm is stable when I measure it in the same dimension and at the same level on the prior study. There is no gastrohepatic or hepatoduodenal ligament lymphadenopathy. No intraperitoneal or retroperitoneal lymphadenopathy. No pelvic sidewall lymphadenopathy. Reproductive: The prostate gland and seminal vesicles have normal imaging features. Other: No intraperitoneal free fluid. Musculoskeletal: Bone windows reveal no worrisome lytic or sclerotic osseous lesions. IMPRESSION: 1. Stable exam without general trend towards decreasing or worsening disease. 2. 3 previously described pulmonary nodules show no appreciable interval change. 3. Multiple hepatic metastases as before. A 8 lesion in the posterior aspect of the right liver measures minimally larger on today's study but the remaining lesions are stable. 4. 4.7 cm abdominal aortic aneurysm. 5. Stable soft tissue fullness in the pancreatic tail. Electronically Signed   By: Misty Stanley M.D.   On: 10/19/2016 16:19   Ct Abdomen Pelvis W Contrast  Result Date: 10/19/2016 CLINICAL DATA:  Stage IV supraglottic invasive squamous cell carcinoma (2008) with non-small-cell lung cancer (2011) EXAM: CT CHEST, ABDOMEN, AND PELVIS WITH CONTRAST TECHNIQUE: Multidetector CT imaging of the chest, abdomen and pelvis was performed following the standard protocol during bolus administration of intravenous contrast. CONTRAST:  128mL ISOVUE-300 IOPAMIDOL (ISOVUE-300) INJECTION 61% COMPARISON:  08/09/2016 FINDINGS: CT CHEST FINDINGS Cardiovascular: The heart size is normal. No pericardial effusion. Coronary artery calcification is noted. Atherosclerotic  calcification is noted in the wall of the thoracic aorta. Mediastinum/Nodes: No mediastinal lymphadenopathy. There is no hilar lymphadenopathy. Patulous mid and distal esophagus, as before. Lungs/Pleura: Emphysema. 7 mm posterior left upper lobe pulmonary nodule is stable. Architectural distortion with volume loss right upper lobe is associated with stable 2.1 x 2.8 cm nodule. The 5 mm right middle lobe pulmonary nodule is unchanged. Musculoskeletal: Bone windows reveal no worrisome lytic or sclerotic osseous lesions. CT ABDOMEN PELVIS FINDINGS Hepatobiliary: Multiple liver metastases are again noted. Lesion in the dome of the left liver measured previously 2.6 x 3.1 cm now measures 2.6 x 3.1 cm. Right liver lesion towards the dome measured previously at 16 mm is 15 mm today. Previously measured discrete lesion lateral segment left liver at 11 mm is 10 mm today. Lesion identified in the posterior aspect of the inferior right liver and measured previously at 3.4 x 4.2 cm now measures 3.5 x 4.8 cm. Inferior right liver lesion measured previously at 4.1 cm now measures 4.1 cm. Pancreas: No focal mass lesion. No dilatation of the main duct. No intraparenchymal cyst. Pancreatic head and body are unremarkable. Fullness in the tail the pancreas is associated with calcification, similar  to prior. This fullness extends into the splenic hilum as before. Spleen: No splenomegaly. No focal mass lesion. Adrenals/Urinary Tract: No adrenal nodule or mass. Left lower pole renal cyst is stable. Right kidney unremarkable. No evidence for hydroureter. Mild circumferential bladder wall thickening. Stomach/Bowel: Stomach is nondistended. No gastric wall thickening. No evidence of outlet obstruction. Duodenum is normally positioned as is the ligament of Treitz. No small bowel wall thickening. No small bowel dilatation. The terminal ileum is normal. The appendix is normal. Diverticular changes are noted in the left colon without evidence  of diverticulitis. Vascular/Lymphatic: 4.7 cm abdominal aortic aneurysm is stable when I measure it in the same dimension and at the same level on the prior study. There is no gastrohepatic or hepatoduodenal ligament lymphadenopathy. No intraperitoneal or retroperitoneal lymphadenopathy. No pelvic sidewall lymphadenopathy. Reproductive: The prostate gland and seminal vesicles have normal imaging features. Other: No intraperitoneal free fluid. Musculoskeletal: Bone windows reveal no worrisome lytic or sclerotic osseous lesions. IMPRESSION: 1. Stable exam without general trend towards decreasing or worsening disease. 2. 3 previously described pulmonary nodules show no appreciable interval change. 3. Multiple hepatic metastases as before. A 8 lesion in the posterior aspect of the right liver measures minimally larger on today's study but the remaining lesions are stable. 4. 4.7 cm abdominal aortic aneurysm. 5. Stable soft tissue fullness in the pancreatic tail. Electronically Signed   By: Misty Stanley M.D.   On: 10/19/2016 16:19    ASSESSMENT AND PLAN:   This is a very pleasant 76 years old African-American male with metastatic non-small cell lung cancer, squamous cell carcinoma with significant liver metastasis. He is status post treatment with systemic chemotherapy with carboplatin and paclitaxel followed by observation. The patient developed evidence for disease progression and he is currently on treatment with second line immunotherapy with Tecentriq [name]) status post 4 cycles. He is tolerating this treatment fairly well with no significant adverse effects. I recommended for him to proceed with cycle #5 today as a scheduled. I will see him back for follow-up visit in 3 weeks for evaluation before starting cycle #6. He was advised to call immediately if he has any concerning symptoms in the interval. The patient voices understanding of current disease status and treatment options and is in  agreement with the current care plan. All questions were answered. The patient knows to call the clinic with any problems, questions or concerns. We can certainly see the patient much sooner if necessary. I spent 10 minutes counseling the patient face to face. The total time spent in the appointment was 15 minutes.  Disclaimer: This note was dictated with voice recognition software. Similar sounding words can inadvertently be transcribed and may not be corrected upon review.

## 2016-11-23 ENCOUNTER — Telehealth: Payer: Self-pay | Admitting: *Deleted

## 2016-11-23 DIAGNOSIS — K769 Liver disease, unspecified: Secondary | ICD-10-CM

## 2016-11-23 DIAGNOSIS — I10 Essential (primary) hypertension: Secondary | ICD-10-CM

## 2016-11-23 DIAGNOSIS — C329 Malignant neoplasm of larynx, unspecified: Secondary | ICD-10-CM

## 2016-11-23 MED ORDER — AMLODIPINE BESYLATE 5 MG PO TABS: 5.0000 mg | ORAL_TABLET | Freq: Every day | ORAL | 2 refills | Status: DC

## 2016-11-23 MED ORDER — FINASTERIDE 5 MG PO TABS: 5.0000 mg | ORAL_TABLET | Freq: Every morning | ORAL | 2 refills | Status: DC

## 2016-11-23 NOTE — Telephone Encounter (Signed)
Rec'd call from [employer] mail service requesting renewals on pt amlodipine & finasteride. Verified chart pt is current sent rx to mail service...Johny Chess

## 2016-12-05 ENCOUNTER — Ambulatory Visit (HOSPITAL_BASED_OUTPATIENT_CLINIC_OR_DEPARTMENT_OTHER): Payer: Medicare Other

## 2016-12-05 ENCOUNTER — Telehealth: Payer: Self-pay

## 2016-12-05 ENCOUNTER — Ambulatory Visit (HOSPITAL_BASED_OUTPATIENT_CLINIC_OR_DEPARTMENT_OTHER): Payer: Medicare Other | Admitting: Internal Medicine

## 2016-12-05 ENCOUNTER — Other Ambulatory Visit (HOSPITAL_BASED_OUTPATIENT_CLINIC_OR_DEPARTMENT_OTHER): Payer: Medicare Other

## 2016-12-05 ENCOUNTER — Encounter: Payer: Self-pay | Admitting: Internal Medicine

## 2016-12-05 VITALS — BP 140/82 | HR 103 | Temp 98.6°F | Resp 18 | Ht 66.0 in | Wt 144.3 lb

## 2016-12-05 DIAGNOSIS — Z5112 Encounter for antineoplastic immunotherapy: Secondary | ICD-10-CM

## 2016-12-05 DIAGNOSIS — Z79899 Other long term (current) drug therapy: Secondary | ICD-10-CM | POA: Diagnosis not present

## 2016-12-05 DIAGNOSIS — C3411 Malignant neoplasm of upper lobe, right bronchus or lung: Secondary | ICD-10-CM

## 2016-12-05 DIAGNOSIS — C787 Secondary malignant neoplasm of liver and intrahepatic bile duct: Secondary | ICD-10-CM | POA: Diagnosis not present

## 2016-12-05 LAB — CBC WITH DIFFERENTIAL/PLATELET
BASO%: 0.8 % (ref 0.0–2.0)
Basophils Absolute: 0.1 10*3/uL (ref 0.0–0.1)
EOS ABS: 0.1 10*3/uL (ref 0.0–0.5)
EOS%: 1.6 % (ref 0.0–7.0)
HEMATOCRIT: 33.4 % — AB (ref 38.4–49.9)
HEMOGLOBIN: 10.3 g/dL — AB (ref 13.0–17.1)
LYMPH%: 7.7 % — ABNORMAL LOW (ref 14.0–49.0)
MCH: 20.5 pg — ABNORMAL LOW (ref 27.2–33.4)
MCHC: 30.9 g/dL — ABNORMAL LOW (ref 32.0–36.0)
MCV: 66.2 fL — ABNORMAL LOW (ref 79.3–98.0)
MONO#: 0.6 10*3/uL (ref 0.1–0.9)
MONO%: 7.2 % (ref 0.0–14.0)
NEUT%: 82.7 % — ABNORMAL HIGH (ref 39.0–75.0)
NEUTROS ABS: 6.4 10*3/uL (ref 1.5–6.5)
Platelets: 219 10*3/uL (ref 140–400)
RBC: 5.04 10*6/uL (ref 4.20–5.82)
RDW: 20.5 % — AB (ref 11.0–14.6)
WBC: 7.7 10*3/uL (ref 4.0–10.3)
lymph#: 0.6 10*3/uL — ABNORMAL LOW (ref 0.9–3.3)

## 2016-12-05 LAB — COMPREHENSIVE METABOLIC PANEL
ALBUMIN: 3.3 g/dL — AB (ref 3.5–5.0)
ALK PHOS: 150 U/L (ref 40–150)
ALT: 10 U/L (ref 0–55)
AST: 25 U/L (ref 5–34)
Anion Gap: 9 mEq/L (ref 3–11)
BUN: 13.6 mg/dL (ref 7.0–26.0)
CALCIUM: 9.2 mg/dL (ref 8.4–10.4)
CO2: 25 mEq/L (ref 22–29)
CREATININE: 0.8 mg/dL (ref 0.7–1.3)
Chloride: 105 mEq/L (ref 98–109)
EGFR: >90 mL/min/{1.73_m2} (ref >90)
GLUCOSE: 98 mg/dL (ref 70–140)
Potassium: 3.8 mEq/L (ref 3.5–5.1)
SODIUM: 139 meq/L (ref 136–145)
TOTAL PROTEIN: 7.4 g/dL (ref 6.4–8.3)
Total Bilirubin: 0.6 mg/dL (ref 0.20–1.20)

## 2016-12-05 LAB — TSH: TSH: 4.535 m(IU)/L — ABNORMAL HIGH (ref 0.320–4.118)

## 2016-12-05 MED ORDER — SODIUM CHLORIDE 0.9 % IV SOLN
Freq: Once | INTRAVENOUS | Status: AC
  Administered 2016-12-05: 14:00:00 via INTRAVENOUS

## 2016-12-05 MED ORDER — SODIUM CHLORIDE 0.9 % IV SOLN
1200.0000 mg | Freq: Once | INTRAVENOUS | Status: AC
  Administered 2016-12-05: 1200 mg via INTRAVENOUS
  Filled 2016-12-05: qty 20

## 2016-12-05 NOTE — Telephone Encounter (Signed)
Wife called at 57 stating pt is on his way. He thought his appt was at 1145 when it was actually 1045. Stated he would be at [facility] in about 15 minutes.

## 2016-12-05 NOTE — Patient Instructions (Signed)
[facility] Discharge Instructions for Patients Receiving Chemotherapy  Today you received the following chemotherapy agents: Tecentriq  To help prevent nausea and vomiting after your treatment, we encourage you to take your nausea medication as directed.    If you develop nausea and vomiting that is not controlled by your nausea medication, call the clinic.   BELOW ARE SYMPTOMS THAT SHOULD BE REPORTED IMMEDIATELY:  *FEVER GREATER THAN 100.5 F  *CHILLS WITH OR WITHOUT FEVER  NAUSEA AND VOMITING THAT IS NOT CONTROLLED WITH YOUR NAUSEA MEDICATION  *UNUSUAL SHORTNESS OF BREATH  *UNUSUAL BRUISING OR BLEEDING  TENDERNESS IN MOUTH AND THROAT WITH OR WITHOUT PRESENCE OF ULCERS  *URINARY PROBLEMS  *BOWEL PROBLEMS  UNUSUAL RASH Items with * indicate a potential emergency and should be followed up as soon as possible.  Feel free to call the clinic you have any questions or concerns. The clinic phone number is [phone].  Please show the [facility] at check-in to the Emergency Department and triage nurse.

## 2016-12-05 NOTE — Progress Notes (Signed)
[place] [phone]   Fax:(336) [phone]  OFFICE PROGRESS NOTE  Casey Koch, MD [place] [address]  PRINCIPAL DIAGNOSES:  1. 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 2.  metastatic non-small cell lung cancer initially diagnosed as Stage IA non-small-cell lung cancer diagnosed in June 2011.  PRIOR THERAPY:  1. Status post total laryngectomy with. Bilateral selective lymph node dissection as well as left superior parotidectomy with nerve dissection under the care of Dr. Redmond Baseman with positive resection margin. 2. Status post course of concurrent chemoradiation with weekly cisplatin. Last dose of chemotherapy was given January 08, 2007. 3. Status post curative radiotherapy with tomotherapy to the lung lesions completed December 31, 2009 under the care of Dr. Pablo Ledger. 4. Systemic chemotherapy with carboplatin for an AUC of 5 and paclitaxel 175 mg meter squared. Status post 6 cycles.   CURRENT THERAPY: Treatment with immunotherapy with Tecentriq (Atezolizumab) 1200 MG IV every 3 weeks. First dose 08/22/2016. Status post 5 cycles.  INTERVAL HISTORY: [name] 76 y.o. male returns to the clinic today for follow-up visit. The patient is feeling fine today with no specific complaints. He continues to tolerate his treatment with Tecentriq [name]) fairly well. He denied having any skin rash or diarrhea. He has no nausea, vomiting, diarrhea or constipation. He denied having any chest pain, shortness breath, cough or hemoptysis. He has no fever or chills. He is here today for evaluation before starting cycle #6.  MEDICAL HISTORY: Past Medical History:  Diagnosis Date  . Cancer ([place]) 2009   SUPERGLOTTIS INVASIVE SQ CELL CA  . Deficiency anemia 08/15/2016  . Encounter for antineoplastic immunotherapy 08/15/2016  . Full code status 03/17/2015  . Goals of care, counseling/discussion  08/15/2016  . Hypertension   . Stroke [facility]) 2002   left side weakness    ALLERGIES:  has No Known Allergies.  MEDICATIONS:  Current Outpatient Prescriptions  Medication Sig Dispense Refill  . albuterol (PROVENTIL) (2.5 MG/3ML) 0.083% nebulizer solution Take 3 mLs (2.5 mg total) by nebulization every 6 (six) hours as needed for wheezing or shortness of breath. 360 mL 5  . allopurinol (ZYLOPRIM) 100 MG tablet Take 1 tablet (100 mg total) by mouth daily. 90 tablet 3  . amLODipine (NORVASC) 5 MG tablet Take 1 tablet (5 mg total) by mouth at bedtime. 90 tablet 2  . aspirin 81 MG tablet Take 81 mg by mouth every morning.     . baclofen (LIORESAL) 10 MG tablet Take 1 tablet (10 mg total) by mouth at bedtime. 90 each 3  . cholecalciferol (VITAMIN D) 1000 UNITS tablet Take 1,000 Units by mouth every morning.     . colchicine 0.6 MG tablet Take 1 tablet (0.6 mg total) by mouth 2 (two) times daily. 60 tablet 11  . diphenhydrAMINE (BENADRYL) 25 MG tablet Take 50 mg by mouth every 8 (eight) hours as needed.    . finasteride (PROSCAR) 5 MG tablet Take 1 tablet (5 mg total) by mouth every morning. 90 tablet 2  . loratadine (CLARITIN) 10 MG tablet Take 10 mg by mouth daily.    . metoprolol succinate (TOPROL-XL) 25 MG 24 hr tablet Take 1 tablet (25 mg total) by mouth every morning. 90 tablet 3  . montelukast (SINGULAIR) 10 MG tablet Take 1 tablet (10 mg total) by mouth at bedtime. 90 tablet 1  . simvastatin (ZOCOR) 20 MG tablet Take 1  tablet (20 mg total) by mouth every evening. 90 tablet 3  . tadalafil (CIALIS) 5 MG tablet Take 1 tablet (5 mg total) by mouth daily. 90 tablet 3  . tiZANidine (ZANAFLEX) 4 MG tablet Take 1 tablet (4 mg total) by mouth 3 (three) times daily as needed for muscle spasms. 90 tablet 1   No current facility-administered medications for this visit.     SURGICAL HISTORY:  Past Surgical History:  Procedure Laterality Date  . TRACHEOSTOMY  2009    REVIEW OF SYSTEMS:  A  comprehensive review of systems was negative except for: Constitutional: positive for fatigue   PHYSICAL EXAMINATION: General appearance: alert, cooperative, fatigued and no distress Head: Normocephalic, without obvious abnormality, atraumatic Neck: no adenopathy, no JVD, supple, symmetrical, trachea midline and thyroid not enlarged, symmetric, no tenderness/mass/nodules Lymph nodes: Cervical, supraclavicular, and axillary nodes normal. Resp: clear to auscultation bilaterally Back: symmetric, no curvature. ROM normal. No CVA tenderness. Cardio: regular rate and rhythm, S1, S2 normal, no murmur, click, rub or gallop GI: soft, non-tender; bowel sounds normal; no masses,  no organomegaly Extremities: extremities normal, atraumatic, no cyanosis or edema  ECOG PERFORMANCE STATUS: 1 - Symptomatic but completely ambulatory  Blood pressure 140/82, pulse (!) 103, temperature 98.6 F (37 C), temperature source Oral, resp. rate 18, height 5\' 6"  (1.676 m), weight 144 lb 4.8 oz (65.5 kg), SpO2 99 %.  LABORATORY DATA: Lab Results  Component Value Date   WBC 7.7 12/05/2016   HGB 10.3 (L) 12/05/2016   HCT 33.4 (L) 12/05/2016   MCV 66.2 (L) 12/05/2016   PLT 219 12/05/2016      Chemistry      Component Value Date/Time   NA 141 11/14/2016 0939   K 3.7 11/14/2016 0939   CL 103 08/02/2012 1107   CO2 25 11/14/2016 0939   BUN 13.8 11/14/2016 0939   CREATININE 0.8 11/14/2016 0939      Component Value Date/Time   CALCIUM 8.9 11/14/2016 0939   ALKPHOS 147 11/14/2016 0939   AST 26 11/14/2016 0939   ALT 9 11/14/2016 0939   BILITOT 0.40 11/14/2016 0939       RADIOGRAPHIC STUDIES: No results found.  ASSESSMENT AND PLAN:   This is a very pleasant 76 years old African-American male with metastatic non-small cell lung cancer, squamous cell carcinoma with significant liver metastasis. He is status post systemic chemotherapy was carboplatin and paclitaxel followed by observation followed by disease  progression. He is currently undergoing treatment with immunotherapy with Tecentriq [name]) status post 5 cycles and has been tolerating the treatment well. I recommended for the patient to proceed with cycle #6 today as a scheduled. I would see him back for follow-up visit in 3 weeks for evaluation after repeating CT scan of the chest, abdomen and pelvis for restaging of his disease. He was advised to call immediately if he has any concerning symptoms in the interval. The patient voices understanding of current disease status and treatment options and is in agreement with the current care plan. All questions were answered. The patient knows to call the clinic with any problems, questions or concerns. We can certainly see the patient much sooner if necessary. I spent 10 minutes counseling the patient face to face. The total time spent in the appointment was 15 minutes.  Disclaimer: This note was dictated with voice recognition software. Similar sounding words can inadvertently be transcribed and may not be corrected upon review.

## 2016-12-08 ENCOUNTER — Telehealth: Payer: Self-pay | Admitting: Internal Medicine

## 2016-12-08 NOTE — Telephone Encounter (Signed)
No additional appts added per 6/26 los - appts already scheduled per los and treatment plan

## 2016-12-20 ENCOUNTER — Telehealth: Payer: Self-pay | Admitting: Internal Medicine

## 2016-12-20 NOTE — Telephone Encounter (Signed)
lvm about appointment changes with call back number

## 2016-12-25 ENCOUNTER — Other Ambulatory Visit (HOSPITAL_BASED_OUTPATIENT_CLINIC_OR_DEPARTMENT_OTHER): Payer: Medicare Other

## 2016-12-25 ENCOUNTER — Ambulatory Visit (HOSPITAL_BASED_OUTPATIENT_CLINIC_OR_DEPARTMENT_OTHER): Payer: Medicare Other | Admitting: Nurse Practitioner

## 2016-12-25 VITALS — BP 128/78 | HR 87 | Temp 100.1°F | Resp 18 | Ht 66.0 in | Wt 146.2 lb

## 2016-12-25 DIAGNOSIS — C787 Secondary malignant neoplasm of liver and intrahepatic bile duct: Secondary | ICD-10-CM

## 2016-12-25 DIAGNOSIS — Z5112 Encounter for antineoplastic immunotherapy: Secondary | ICD-10-CM

## 2016-12-25 DIAGNOSIS — Z923 Personal history of irradiation: Secondary | ICD-10-CM

## 2016-12-25 DIAGNOSIS — C3411 Malignant neoplasm of upper lobe, right bronchus or lung: Secondary | ICD-10-CM | POA: Diagnosis not present

## 2016-12-25 DIAGNOSIS — Z8521 Personal history of malignant neoplasm of larynx: Secondary | ICD-10-CM | POA: Diagnosis not present

## 2016-12-25 DIAGNOSIS — Z79899 Other long term (current) drug therapy: Secondary | ICD-10-CM | POA: Diagnosis not present

## 2016-12-25 LAB — COMPREHENSIVE METABOLIC PANEL WITH GFR
ALT: 14 U/L (ref 0–55)
AST: 29 U/L (ref 5–34)
Albumin: 3.5 g/dL (ref 3.5–5.0)
Alkaline Phosphatase: 164 U/L — ABNORMAL HIGH (ref 40–150)
Anion Gap: 8 meq/L (ref 3–11)
BUN: 14.1 mg/dL (ref 7.0–26.0)
CO2: 25 meq/L (ref 22–29)
Calcium: 9.3 mg/dL (ref 8.4–10.4)
Chloride: 105 meq/L (ref 98–109)
Creatinine: 0.8 mg/dL (ref 0.7–1.3)
EGFR: >90 ml/min/1.73 m2
Glucose: 119 mg/dL (ref 70–140)
Potassium: 4.1 meq/L (ref 3.5–5.1)
Sodium: 138 meq/L (ref 136–145)
Total Bilirubin: 0.42 mg/dL (ref 0.20–1.20)
Total Protein: 7.5 g/dL (ref 6.4–8.3)

## 2016-12-25 LAB — CBC WITH DIFFERENTIAL/PLATELET
BASO%: 0.3 % (ref 0.0–2.0)
Basophils Absolute: 0 10e3/uL (ref 0.0–0.1)
EOS%: 1.1 % (ref 0.0–7.0)
Eosinophils Absolute: 0.1 10e3/uL (ref 0.0–0.5)
HCT: 36.7 % — ABNORMAL LOW (ref 38.4–49.9)
HGB: 11.2 g/dL — ABNORMAL LOW (ref 13.0–17.1)
LYMPH%: 11 % — ABNORMAL LOW (ref 14.0–49.0)
MCH: 21.1 pg — ABNORMAL LOW (ref 27.2–33.4)
MCHC: 30.5 g/dL — ABNORMAL LOW (ref 32.0–36.0)
MCV: 69.2 fL — ABNORMAL LOW (ref 79.3–98.0)
MONO#: 0.4 10e3/uL (ref 0.1–0.9)
MONO%: 4 % (ref 0.0–14.0)
NEUT#: 7.6 10e3/uL — ABNORMAL HIGH (ref 1.5–6.5)
NEUT%: 83.6 % — ABNORMAL HIGH (ref 39.0–75.0)
Platelets: 188 10e3/uL (ref 140–400)
RBC: 5.3 10e6/uL (ref 4.20–5.82)
RDW: 20.9 % — ABNORMAL HIGH (ref 11.0–14.6)
WBC: 9.1 10e3/uL (ref 4.0–10.3)
lymph#: 1 10e3/uL (ref 0.9–3.3)

## 2016-12-25 LAB — TSH: TSH: 3.112 m(IU)/L (ref 0.320–4.118)

## 2016-12-25 NOTE — Progress Notes (Addendum)
[facility] OFFICE PROGRESS NOTE   PRINCIPAL DIAGNOSES:  1. 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 2. Metastatic non-small cell lung cancer initially diagnosed as Stage IA non-small-cell lung cancer diagnosed in June 2011.  PRIOR THERAPY:  1. Status post total laryngectomy with. Bilateral selective lymph node dissection as well as left superior parotidectomy with nerve dissection under the care of Dr. Redmond Baseman with positive resection margin. 2. Status post course of concurrent chemoradiation with weekly cisplatin. Last dose of chemotherapy was given January 08, 2007. 3. Status post curative radiotherapy with tomotherapy to the lung lesions completed December 31, 2009 under the care of Dr. Pablo Ledger. 4. Systemic chemotherapy with carboplatin for an AUC of 5 and paclitaxel 175 mg meter squared. Status post 6 cycles.   CURRENT THERAPY: Treatment with immunotherapy with Tecentriq (Atezolizumab) 1200 MG IV every 3 weeks. First dose 08/22/2016. Status post 5 cycles.   INTERVAL HISTORY:   Mr. Hobin returns as scheduled. He completed cycle 6 Atezolizumab 12/05/2016. He overall feels well. He denies nausea/vomiting. No mouth sores. No diarrhea. No rash. He has stable dyspnea on exertion. No cough. No fever. Appetite varies. He denies pain.  Objective:  Vital signs in last 24 hours:  Blood pressure 128/78, pulse 87, temperature 100.1 F (37.8 C), temperature source Oral, resp. rate 18, height 5\' 6"  (1.676 m), weight 146 lb 3.2 oz (66.3 kg), SpO2 97 %.    HEENT: No thrush or ulcers. Resp: Bilateral expiratory rhonchi. No respiratory distress. Tracheostomy. Cardio: Regular rate and rhythm. GI: No hepatomegaly. Vascular: No leg edema.   Lab Results:  Lab Results  Component Value Date   WBC 9.1 12/25/2016   HGB 11.2 (L) 12/25/2016   HCT 36.7 (L) 12/25/2016   MCV 69.2 (L) 12/25/2016   PLT 188 12/25/2016   NEUTROABS 7.6 (H)  12/25/2016    Imaging:  No results found.  Medications: I have reviewed the patient's current medications.  Assessment/Plan: 1. Metastatic non-small cell lung cancer, squamous cell carcinoma with significant liver metastasis; status post systemic chemotherapy was carboplatin and paclitaxel followed by observation followed by disease progression. Currently undergoing treatment with immunotherapy with Tecentriq [name]) status post 6 cycles.   Disposition: Mr. Kertz has completed 6 cycles of Atezolizumab. The plan is to proceed with cycle 7 on 12/26/2016. He is scheduled for restaging CT scan 12/27/2016. He will return for follow-up as scheduled in 3 weeks. He will contact the office in the interim with any problems.  Patient seen with Dr. Julien Nordmann.    Ned Card ANP/GNP-BC   12/25/2016  3:05 PM   ADDENDUM: Hematology/Oncology Attending: I had a face to face encounter with the patient today. I recommended his care plan. This is a very pleasant 76 years old African-American male with metastatic non-small cell lung cancer, squamous cell carcinoma with significant liver metastases and currently on treatment with immunotherapy with Tecentriq [name]) status post 6 cycles. He was supposed to have repeat CT scan of the chest, abdomen and pelvis before this visit but unfortunately has a scan scheduled for 2 days later. I recommended for the patient to proceed with cycle #7 today as a scheduled. I will adjust his treatment in the future if there is any concerning findings on the CT scan He would come back for follow-up visit in 3 weeks as previously scheduled for the next cycle of his treatment. The patient was advised to call immediately if she has any concerning symptoms in  the interval. Disclaimer: This note was dictated with voice recognition software. Similar sounding words can inadvertently be transcribed and may be missed upon review. Eilleen Kempf.,  MD 12/25/16

## 2016-12-26 ENCOUNTER — Ambulatory Visit (HOSPITAL_BASED_OUTPATIENT_CLINIC_OR_DEPARTMENT_OTHER): Payer: Medicare Other

## 2016-12-26 ENCOUNTER — Ambulatory Visit: Payer: Medicare Other | Admitting: Internal Medicine

## 2016-12-26 ENCOUNTER — Other Ambulatory Visit: Payer: Medicare Other

## 2016-12-26 DIAGNOSIS — C3411 Malignant neoplasm of upper lobe, right bronchus or lung: Secondary | ICD-10-CM | POA: Diagnosis not present

## 2016-12-26 DIAGNOSIS — Z5112 Encounter for antineoplastic immunotherapy: Secondary | ICD-10-CM

## 2016-12-26 DIAGNOSIS — C787 Secondary malignant neoplasm of liver and intrahepatic bile duct: Secondary | ICD-10-CM

## 2016-12-26 MED ORDER — SODIUM CHLORIDE 0.9 % IV SOLN
Freq: Once | INTRAVENOUS | Status: AC
  Administered 2016-12-26: 12:00:00 via INTRAVENOUS

## 2016-12-26 MED ORDER — SODIUM CHLORIDE 0.9 % IV SOLN
1200.0000 mg | Freq: Once | INTRAVENOUS | Status: AC
  Administered 2016-12-26: 1200 mg via INTRAVENOUS
  Filled 2016-12-26: qty 20

## 2016-12-26 NOTE — Patient Instructions (Signed)
[facility] Discharge Instructions for Patients Receiving Chemotherapy  Today you received the following chemotherapy agents: Tecentriq  To help prevent nausea and vomiting after your treatment, we encourage you to take your nausea medication as directed.    If you develop nausea and vomiting that is not controlled by your nausea medication, call the clinic.   BELOW ARE SYMPTOMS THAT SHOULD BE REPORTED IMMEDIATELY:  *FEVER GREATER THAN 100.5 F  *CHILLS WITH OR WITHOUT FEVER  NAUSEA AND VOMITING THAT IS NOT CONTROLLED WITH YOUR NAUSEA MEDICATION  *UNUSUAL SHORTNESS OF BREATH  *UNUSUAL BRUISING OR BLEEDING  TENDERNESS IN MOUTH AND THROAT WITH OR WITHOUT PRESENCE OF ULCERS  *URINARY PROBLEMS  *BOWEL PROBLEMS  UNUSUAL RASH Items with * indicate a potential emergency and should be followed up as soon as possible.  Feel free to call the clinic you have any questions or concerns. The clinic phone number is [phone].  Please show the [place] at check-in to the Emergency Department and triage nurse.

## 2016-12-27 ENCOUNTER — Ambulatory Visit (HOSPITAL_COMMUNITY)
Admission: RE | Admit: 2016-12-27 | Discharge: 2016-12-27 | Disposition: A | Discharge status: Home / Self Care | Payer: Medicare Other | Source: Ambulatory Visit | Attending: Internal Medicine | Admitting: Internal Medicine

## 2016-12-27 ENCOUNTER — Encounter (HOSPITAL_COMMUNITY): Payer: Self-pay

## 2016-12-27 DIAGNOSIS — I700 Atherosclerosis of aorta: Secondary | ICD-10-CM | POA: Insufficient documentation

## 2016-12-27 DIAGNOSIS — C787 Secondary malignant neoplasm of liver and intrahepatic bile duct: Secondary | ICD-10-CM | POA: Diagnosis not present

## 2016-12-27 DIAGNOSIS — I714 Abdominal aortic aneurysm, without rupture: Secondary | ICD-10-CM | POA: Diagnosis not present

## 2016-12-27 DIAGNOSIS — J439 Emphysema, unspecified: Secondary | ICD-10-CM | POA: Diagnosis not present

## 2016-12-27 DIAGNOSIS — C3411 Malignant neoplasm of upper lobe, right bronchus or lung: Secondary | ICD-10-CM | POA: Diagnosis present

## 2016-12-27 DIAGNOSIS — Z5112 Encounter for antineoplastic immunotherapy: Secondary | ICD-10-CM | POA: Diagnosis not present

## 2016-12-27 MED ORDER — IOPAMIDOL (ISOVUE-300) INJECTION 61%
INTRAVENOUS | Status: AC
  Filled 2016-12-27: qty 100

## 2016-12-27 MED ORDER — IOPAMIDOL (ISOVUE-300) INJECTION 61%
100.0000 mL | Freq: Once | INTRAVENOUS | Status: AC | PRN
  Administered 2016-12-27: 100 mL via INTRAVENOUS

## 2017-01-02 ENCOUNTER — Other Ambulatory Visit: Payer: Self-pay | Admitting: Internal Medicine

## 2017-01-02 MED FILL — MONTELUKAST SOD 10 MG TAB: 10 | 90 days supply | Qty: 90 | Fill #0

## 2017-01-16 ENCOUNTER — Encounter: Payer: Self-pay | Admitting: Internal Medicine

## 2017-01-16 ENCOUNTER — Other Ambulatory Visit (HOSPITAL_BASED_OUTPATIENT_CLINIC_OR_DEPARTMENT_OTHER): Payer: Medicare Other

## 2017-01-16 ENCOUNTER — Ambulatory Visit (HOSPITAL_BASED_OUTPATIENT_CLINIC_OR_DEPARTMENT_OTHER): Payer: Medicare Other | Admitting: Internal Medicine

## 2017-01-16 ENCOUNTER — Ambulatory Visit (HOSPITAL_BASED_OUTPATIENT_CLINIC_OR_DEPARTMENT_OTHER): Payer: Medicare Other

## 2017-01-16 ENCOUNTER — Telehealth: Payer: Self-pay | Admitting: Internal Medicine

## 2017-01-16 VITALS — BP 146/82 | HR 80 | Temp 98.6°F | Resp 18 | Ht 66.0 in | Wt 142.7 lb

## 2017-01-16 DIAGNOSIS — I10 Essential (primary) hypertension: Secondary | ICD-10-CM | POA: Diagnosis not present

## 2017-01-16 DIAGNOSIS — Z79899 Other long term (current) drug therapy: Secondary | ICD-10-CM | POA: Diagnosis not present

## 2017-01-16 DIAGNOSIS — C787 Secondary malignant neoplasm of liver and intrahepatic bile duct: Secondary | ICD-10-CM

## 2017-01-16 DIAGNOSIS — C3411 Malignant neoplasm of upper lobe, right bronchus or lung: Secondary | ICD-10-CM

## 2017-01-16 DIAGNOSIS — Z5112 Encounter for antineoplastic immunotherapy: Secondary | ICD-10-CM

## 2017-01-16 DIAGNOSIS — D539 Nutritional anemia, unspecified: Secondary | ICD-10-CM

## 2017-01-16 LAB — CBC WITH DIFFERENTIAL/PLATELET
BASO%: 0.6 % (ref 0.0–2.0)
BASOS ABS: 0 10*3/uL (ref 0.0–0.1)
EOS%: 2.3 % (ref 0.0–7.0)
Eosinophils Absolute: 0.2 10*3/uL (ref 0.0–0.5)
HCT: 37.3 % — ABNORMAL LOW (ref 38.4–49.9)
HEMOGLOBIN: 11.5 g/dL — AB (ref 13.0–17.1)
LYMPH#: 1 10*3/uL (ref 0.9–3.3)
LYMPH%: 13.4 % — AB (ref 14.0–49.0)
MCH: 21.9 pg — AB (ref 27.2–33.4)
MCHC: 30.8 g/dL — AB (ref 32.0–36.0)
MCV: 71.2 fL — AB (ref 79.3–98.0)
MONO#: 0.5 10*3/uL (ref 0.1–0.9)
MONO%: 6.6 % (ref 0.0–14.0)
NEUT#: 5.6 10*3/uL (ref 1.5–6.5)
NEUT%: 77.1 % — AB (ref 39.0–75.0)
Platelets: 207 10*3/uL (ref 140–400)
RBC: 5.24 10*6/uL (ref 4.20–5.82)
RDW: 22.7 % — ABNORMAL HIGH (ref 11.0–14.6)
WBC: 7.2 10*3/uL (ref 4.0–10.3)
nRBC: 0 % (ref 0–0)

## 2017-01-16 LAB — TSH: TSH: 4.802 m[IU]/L — AB (ref 0.320–4.118)

## 2017-01-16 LAB — COMPREHENSIVE METABOLIC PANEL
ALBUMIN: 3.4 g/dL — AB (ref 3.5–5.0)
ALK PHOS: 157 U/L — AB (ref 40–150)
ALT: 14 U/L (ref 0–55)
AST: 30 U/L (ref 5–34)
Anion Gap: 9 mEq/L (ref 3–11)
BUN: 12.5 mg/dL (ref 7.0–26.0)
CALCIUM: 9.4 mg/dL (ref 8.4–10.4)
CHLORIDE: 105 meq/L (ref 98–109)
CO2: 25 mEq/L (ref 22–29)
Creatinine: 0.8 mg/dL (ref 0.7–1.3)
GLUCOSE: 102 mg/dL (ref 70–140)
POTASSIUM: 3.8 meq/L (ref 3.5–5.1)
SODIUM: 139 meq/L (ref 136–145)
Total Bilirubin: 0.47 mg/dL (ref 0.20–1.20)
Total Protein: 7.3 g/dL (ref 6.4–8.3)

## 2017-01-16 MED ORDER — SODIUM CHLORIDE 0.9 % IV SOLN
1200.0000 mg | Freq: Once | INTRAVENOUS | Status: AC
  Administered 2017-01-16: 1200 mg via INTRAVENOUS
  Filled 2017-01-16: qty 20

## 2017-01-16 MED ORDER — SODIUM CHLORIDE 0.9 % IV SOLN
Freq: Once | INTRAVENOUS | Status: AC
  Administered 2017-01-16: 13:00:00 via INTRAVENOUS

## 2017-01-16 NOTE — Patient Instructions (Addendum)
  [facility] Discharge Instructions for Patients Receiving Chemotherapy  Today you received the following chemotherapy agents Tecentriq  To help prevent nausea and vomiting after your treatment, we encourage you to take your nausea medication    If you develop nausea and vomiting that is not controlled by your nausea medication, call the clinic.   BELOW ARE SYMPTOMS THAT SHOULD BE REPORTED IMMEDIATELY:  *FEVER GREATER THAN 100.5 F  *CHILLS WITH OR WITHOUT FEVER  NAUSEA AND VOMITING THAT IS NOT CONTROLLED WITH YOUR NAUSEA MEDICATION  *UNUSUAL SHORTNESS OF BREATH  *UNUSUAL BRUISING OR BLEEDING  TENDERNESS IN MOUTH AND THROAT WITH OR WITHOUT PRESENCE OF ULCERS  *URINARY PROBLEMS  *BOWEL PROBLEMS  UNUSUAL RASH Items with * indicate a potential emergency and should be followed up as soon as possible.  Feel free to call the clinic you have any questions or concerns. The clinic phone number is [phone].  Please show the [facility] at check-in to the Emergency Department and triage nurse.

## 2017-01-16 NOTE — Progress Notes (Signed)
[facility] [phone]   [phone]  OFFICE PROGRESS NOTE  [name], MD [place] [address]  PRINCIPAL DIAGNOSES:  1. 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 2.  metastatic non-small cell lung cancer initially diagnosed as Stage IA non-small-cell lung cancer diagnosed in June 2011.  PRIOR THERAPY:  1. Status post total laryngectomy with. Bilateral selective lymph node dissection as well as left superior parotidectomy with nerve dissection under the care of Dr. Redmond Baseman with positive resection margin. 2. Status post course of concurrent chemoradiation with weekly cisplatin. Last dose of chemotherapy was given January 08, 2007. 3. Status post curative radiotherapy with tomotherapy to the lung lesions completed December 31, 2009 under the care of Dr. Pablo Ledger. 4. Systemic chemotherapy with carboplatin for an AUC of 5 and paclitaxel 175 mg meter squared. Status post 6 cycles.   CURRENT THERAPY: Treatment with immunotherapy with Tecentriq (Atezolizumab) 1200 MG IV every 3 weeks. First dose 08/22/2016. Status post 7 cycles.  INTERVAL HISTORY: [name] 76 y.o. male returns to the clinic today for follow-up visit accompanied by his wife. The patient is feeling fine today with no specific complaints. He continues to tolerate this treatment fairly well with no significant adverse effects. He denied having any chest pain, shortness of breath, cough or hemoptysis. He denied having any fever or chills. He has no nausea, vomiting, diarrhea or constipation. He has no significant weight loss or night sweats. He had repeat CT scan of the chest, abdomen and pelvis performed recently and he is here for evaluation and discussion of his scan results and treatment options.   MEDICAL HISTORY: Past Medical History:  Diagnosis Date  . Cancer ([facility]) 2009   SUPERGLOTTIS INVASIVE SQ CELL CA  .  Deficiency anemia 08/15/2016  . Encounter for antineoplastic immunotherapy 08/15/2016  . Full code status 03/17/2015  . Goals of care, counseling/discussion 08/15/2016  . Hypertension   . Stroke [facility]) 2002   left side weakness    ALLERGIES:  has No Known Allergies.  MEDICATIONS:  Current Outpatient Prescriptions  Medication Sig Dispense Refill  . albuterol (PROVENTIL) (2.5 MG/3ML) 0.083% nebulizer solution Take 3 mLs (2.5 mg total) by nebulization every 6 (six) hours as needed for wheezing or shortness of breath. 360 mL 5  . allopurinol (ZYLOPRIM) 100 MG tablet Take 1 tablet (100 mg total) by mouth daily. 90 tablet 3  . amLODipine (NORVASC) 5 MG tablet Take 1 tablet (5 mg total) by mouth at bedtime. 90 tablet 2  . aspirin 81 MG tablet Take 81 mg by mouth every morning.     . baclofen (LIORESAL) 10 MG tablet Take 1 tablet (10 mg total) by mouth at bedtime. 90 each 3  . cholecalciferol (VITAMIN D) 1000 UNITS tablet Take 1,000 Units by mouth every morning.     . colchicine 0.6 MG tablet Take 1 tablet (0.6 mg total) by mouth 2 (two) times daily. 60 tablet 11  . diphenhydrAMINE (BENADRYL) 25 MG tablet Take 50 mg by mouth every 8 (eight) hours as needed.    . finasteride (PROSCAR) 5 MG tablet Take 1 tablet (5 mg total) by mouth every morning. 90 tablet 2  . loratadine (CLARITIN) 10 MG tablet Take 10 mg by mouth daily.    . metoprolol succinate (TOPROL-XL) 25 MG 24 hr tablet Take 1 tablet (25 mg total) by mouth every morning. 90 tablet 3  .  montelukast (SINGULAIR) 10 MG tablet TAKE 1 TABLET BY MOUTH ONCE DAILY AT BEDTIME 90 tablet 1  . simvastatin (ZOCOR) 20 MG tablet Take 1 tablet (20 mg total) by mouth every evening. 90 tablet 3  . tadalafil (CIALIS) 5 MG tablet Take 1 tablet (5 mg total) by mouth daily. 90 tablet 3  . tiZANidine (ZANAFLEX) 4 MG tablet Take 1 tablet (4 mg total) by mouth 3 (three) times daily as needed for muscle spasms. 90 tablet 1   No current facility-administered medications  for this visit.     SURGICAL HISTORY:  Past Surgical History:  Procedure Laterality Date  . TRACHEOSTOMY  2009    REVIEW OF SYSTEMS:  Constitutional: positive for fatigue Eyes: negative Ears, nose, mouth, throat, and face: negative Respiratory: negative Cardiovascular: negative Gastrointestinal: negative Genitourinary:negative Integument/breast: negative Hematologic/lymphatic: negative Musculoskeletal:positive for muscle weakness Neurological: negative Behavioral/Psych: negative Endocrine: negative Allergic/Immunologic: negative   PHYSICAL EXAMINATION: General appearance: alert, cooperative, fatigued and no distress Head: Normocephalic, without obvious abnormality, atraumatic Neck: no adenopathy, no JVD, supple, symmetrical, trachea midline and thyroid not enlarged, symmetric, no tenderness/mass/nodules Lymph nodes: Cervical, supraclavicular, and axillary nodes normal. Resp: clear to auscultation bilaterally Back: symmetric, no curvature. ROM normal. No CVA tenderness. Cardio: regular rate and rhythm, S1, S2 normal, no murmur, click, rub or gallop GI: soft, non-tender; bowel sounds normal; no masses,  no organomegaly Extremities: extremities normal, atraumatic, no cyanosis or edema Neurologic: Alert and oriented X 3, normal strength and tone. Normal symmetric reflexes. Normal coordination and gait  ECOG PERFORMANCE STATUS: 1 - Symptomatic but completely ambulatory  Blood pressure (!) 146/82, pulse 80, temperature 98.6 F (37 C), temperature source Oral, resp. rate 18, height 5\' 6"  (1.676 m), weight 142 lb 11.2 oz (64.7 kg), SpO2 98 %.  LABORATORY DATA: Lab Results  Component Value Date   WBC 9.1 12/25/2016   HGB 11.2 (L) 12/25/2016   HCT 36.7 (L) 12/25/2016   MCV 69.2 (L) 12/25/2016   PLT 188 12/25/2016      Chemistry      Component Value Date/Time   NA 138 12/25/2016 1428   K 4.1 12/25/2016 1428   CL 103 08/02/2012 1107   CO2 25 12/25/2016 1428   BUN 14.1  12/25/2016 1428   CREATININE 0.8 12/25/2016 1428      Component Value Date/Time   CALCIUM 9.3 12/25/2016 1428   ALKPHOS 164 (H) 12/25/2016 1428   AST 29 12/25/2016 1428   ALT 14 12/25/2016 1428   BILITOT 0.42 12/25/2016 1428       RADIOGRAPHIC STUDIES: Ct Chest W Contrast  Result Date: 12/27/2016 CLINICAL DATA:  Supraglottic invasive squamous cell cancer status post laryngectomy 2008. Metastatic non-small cell lung cancer diagnosis 2011. Restaging. EXAM: CT CHEST, ABDOMEN, AND PELVIS WITH CONTRAST TECHNIQUE: Multidetector CT imaging of the chest, abdomen and pelvis was performed following the standard protocol during bolus administration of intravenous contrast. CONTRAST:  157mL ISOVUE-300 IOPAMIDOL (ISOVUE-300) INJECTION 61% COMPARISON:  Multiple prior CTs the most recent are 10/19/2016 and 08/01/2016 FINDINGS: CT CHEST FINDINGS Cardiovascular: The heart is normal in size and stable. There is fairly marked tortuosity and calcification of the thoracic aorta. No dissection. Stable three-vessel coronary artery calcifications. Mediastinum/Nodes: Stable scattered mediastinal and hilar lymph nodes. No mass or overt adenopathy. Stable mild esophageal dilatation slight wall thickening. Lungs/Pleura: Stable mild emphysematous changes. Stable bilateral pulmonary nodules. 6 mm left upper lobe nodule on image number 28 is stable. 25 x 21 mm right upper lobe pulmonary mass is stable.  5.5 mm right middle lobe pulmonary nodule on image number 66 is stable. 7 mm right lower lobe pulmonary nodule on image number 91 is stable. No new pulmonary lesions or acute pulmonary findings. No pleural effusion. Musculoskeletal: No significant bony findings. CT ABDOMEN PELVIS FINDINGS Hepatobiliary: Diffuse hepatic metastatic disease is again demonstrated. Segment 6 lesion measures a maximum of 4.4 cm and previously measured 4.1 cm. Segment 5 lesion on image number 59 measures 41 mm and previously measured 41 mm. No definite new  lesions. The gallbladder is normal.  No common bile duct dilatation. Pancreas: No mass, inflammation or ductal dilatation. Stable fullness in the pancreatic tail without discrete mass. Spleen: Normal size.  No focal lesions. Adrenals/Urinary Tract: The adrenal glands and kidneys are unremarkable and stable. Stable lower pole left renal cyst. No hydronephrosis. The bladder is grossly normal. Stomach/Bowel: The stomach, duodenum, small bowel and colon are grossly normal. No acute inflammatory changes, mass lesions or obstructive findings. The terminal ileum is normal. The appendix is normal. Vascular/Lymphatic: Stable 4.4 x 4.1 cm infrarenal abdominal aortic aneurysm located just above the iliac artery bifurcation. No dissection. Advanced atherosclerotic calcifications involving the aorta and branch vessels. No mesenteric or retroperitoneal mass or adenopathy.  The Reproductive: The prostate gland and seminal vesicles are unremarkable. Other: No pelvic mass or adenopathy. No free pelvic fluid collections. No inguinal mass or adenopathy. Musculoskeletal: No significant bony findings. IMPRESSION: 1. Stable pulmonary and hepatic metastatic disease as detailed above. 2. No acute abdominal/pelvic findings and no acute findings in the chest. 3. Stable emphysematous changes. 4. Stable 4.1 x 4.4 cm infrarenal abdominal aortic aneurysm and stable severe atherosclerotic calcifications involving the thoracic and abdominal aorta. Electronically Signed   By: Marijo Sanes M.D.   On: 12/27/2016 16:57   Ct Abdomen Pelvis W Contrast  Result Date: 12/27/2016 CLINICAL DATA:  Supraglottic invasive squamous cell cancer status post laryngectomy 2008. Metastatic non-small cell lung cancer diagnosis 2011. Restaging. EXAM: CT CHEST, ABDOMEN, AND PELVIS WITH CONTRAST TECHNIQUE: Multidetector CT imaging of the chest, abdomen and pelvis was performed following the standard protocol during bolus administration of intravenous contrast.  CONTRAST:  123mL ISOVUE-300 IOPAMIDOL (ISOVUE-300) INJECTION 61% COMPARISON:  Multiple prior CTs the most recent are 10/19/2016 and 08/01/2016 FINDINGS: CT CHEST FINDINGS Cardiovascular: The heart is normal in size and stable. There is fairly marked tortuosity and calcification of the thoracic aorta. No dissection. Stable three-vessel coronary artery calcifications. Mediastinum/Nodes: Stable scattered mediastinal and hilar lymph nodes. No mass or overt adenopathy. Stable mild esophageal dilatation slight wall thickening. Lungs/Pleura: Stable mild emphysematous changes. Stable bilateral pulmonary nodules. 6 mm left upper lobe nodule on image number 28 is stable. 25 x 21 mm right upper lobe pulmonary mass is stable. 5.5 mm right middle lobe pulmonary nodule on image number 66 is stable. 7 mm right lower lobe pulmonary nodule on image number 91 is stable. No new pulmonary lesions or acute pulmonary findings. No pleural effusion. Musculoskeletal: No significant bony findings. CT ABDOMEN PELVIS FINDINGS Hepatobiliary: Diffuse hepatic metastatic disease is again demonstrated. Segment 6 lesion measures a maximum of 4.4 cm and previously measured 4.1 cm. Segment 5 lesion on image number 59 measures 41 mm and previously measured 41 mm. No definite new lesions. The gallbladder is normal.  No common bile duct dilatation. Pancreas: No mass, inflammation or ductal dilatation. Stable fullness in the pancreatic tail without discrete mass. Spleen: Normal size.  No focal lesions. Adrenals/Urinary Tract: The adrenal glands and kidneys are unremarkable and stable. Stable  lower pole left renal cyst. No hydronephrosis. The bladder is grossly normal. Stomach/Bowel: The stomach, duodenum, small bowel and colon are grossly normal. No acute inflammatory changes, mass lesions or obstructive findings. The terminal ileum is normal. The appendix is normal. Vascular/Lymphatic: Stable 4.4 x 4.1 cm infrarenal abdominal aortic aneurysm located just  above the iliac artery bifurcation. No dissection. Advanced atherosclerotic calcifications involving the aorta and branch vessels. No mesenteric or retroperitoneal mass or adenopathy.  The Reproductive: The prostate gland and seminal vesicles are unremarkable. Other: No pelvic mass or adenopathy. No free pelvic fluid collections. No inguinal mass or adenopathy. Musculoskeletal: No significant bony findings. IMPRESSION: 1. Stable pulmonary and hepatic metastatic disease as detailed above. 2. No acute abdominal/pelvic findings and no acute findings in the chest. 3. Stable emphysematous changes. 4. Stable 4.1 x 4.4 cm infrarenal abdominal aortic aneurysm and stable severe atherosclerotic calcifications involving the thoracic and abdominal aorta. Electronically Signed   By: Marijo Sanes M.D.   On: 12/27/2016 16:57    ASSESSMENT AND PLAN:   This is a very pleasant 76 years old African-American male with metastatic non-small cell lung cancer, squamous cell carcinoma with significant liver metastasis. He is status post systemic chemotherapy was carboplatin and paclitaxel followed by observation followed by disease progression. He is currently undergoing treatment with immunotherapy with Tecentriq [name]) status post 7 cycles and has been tolerating the treatment well. He had repeat CT scan of the chest, abdomen and pelvis performed recently. I personally and independently reviewed the scans and discussed the results with the patient and his wife. His scan showed stable disease with no significant progression and the lung or liver. I recommended for the patient to continue his current treatment with Tecentriq [name]) and he will proceed with cycle #8 today. He would come back for follow-up visit in 3 weeks for evaluation before the next cycle of his treatment. For hypertension, the patient will continue with his current blood pressure medication. He was advised to call immediately if he has any  concerning symptoms in the interval. The patient voices understanding of current disease status and treatment options and is in agreement with the current care plan. All questions were answered. The patient knows to call the clinic with any problems, questions or concerns. We can certainly see the patient much sooner if necessary.  Disclaimer: This note was dictated with voice recognition software. Similar sounding words can inadvertently be transcribed and may not be corrected upon review.

## 2017-01-16 NOTE — Telephone Encounter (Signed)
Scheduled appt per 8/7 los - Gave patient AVS and calender per los. -

## 2017-01-22 ENCOUNTER — Other Ambulatory Visit: Payer: Self-pay | Admitting: Internal Medicine

## 2017-01-23 ENCOUNTER — Telehealth: Payer: Self-pay

## 2017-01-23 MED FILL — ALBUTEROL 0.083% INHAL SOLN: (2.5 MG/3ML | 30 days supply | Qty: 360 | Fill #0

## 2017-01-23 NOTE — Telephone Encounter (Signed)
Called and left a message with kristen appt on 10/9 due to dr Julien Nordmann pal day  [name]

## 2017-02-05 ENCOUNTER — Other Ambulatory Visit: Payer: Self-pay

## 2017-02-05 DIAGNOSIS — K769 Liver disease, unspecified: Secondary | ICD-10-CM

## 2017-02-05 DIAGNOSIS — Z8673 Personal history of transient ischemic attack (TIA), and cerebral infarction without residual deficits: Secondary | ICD-10-CM

## 2017-02-05 DIAGNOSIS — C329 Malignant neoplasm of larynx, unspecified: Secondary | ICD-10-CM

## 2017-02-05 MED ORDER — METOPROLOL SUCCINATE ER 25 MG PO TB24
25.0000 mg | ORAL_TABLET | Freq: Every morning | ORAL | 0 refills | Status: DC
Start: 2017-02-05 — End: 2017-02-26

## 2017-02-06 ENCOUNTER — Telehealth: Payer: Self-pay | Admitting: Internal Medicine

## 2017-02-06 ENCOUNTER — Ambulatory Visit (HOSPITAL_BASED_OUTPATIENT_CLINIC_OR_DEPARTMENT_OTHER): Payer: Medicare Other | Admitting: Internal Medicine

## 2017-02-06 ENCOUNTER — Other Ambulatory Visit (HOSPITAL_BASED_OUTPATIENT_CLINIC_OR_DEPARTMENT_OTHER): Payer: Medicare Other

## 2017-02-06 ENCOUNTER — Ambulatory Visit (HOSPITAL_BASED_OUTPATIENT_CLINIC_OR_DEPARTMENT_OTHER): Payer: Medicare Other

## 2017-02-06 ENCOUNTER — Encounter: Payer: Self-pay | Admitting: Internal Medicine

## 2017-02-06 VITALS — BP 145/88 | HR 80 | Temp 98.9°F | Resp 18 | Ht 66.0 in | Wt 144.7 lb

## 2017-02-06 DIAGNOSIS — C787 Secondary malignant neoplasm of liver and intrahepatic bile duct: Secondary | ICD-10-CM | POA: Diagnosis not present

## 2017-02-06 DIAGNOSIS — I10 Essential (primary) hypertension: Secondary | ICD-10-CM | POA: Diagnosis not present

## 2017-02-06 DIAGNOSIS — Z5112 Encounter for antineoplastic immunotherapy: Secondary | ICD-10-CM

## 2017-02-06 DIAGNOSIS — Z923 Personal history of irradiation: Secondary | ICD-10-CM | POA: Diagnosis not present

## 2017-02-06 DIAGNOSIS — Z79899 Other long term (current) drug therapy: Secondary | ICD-10-CM

## 2017-02-06 DIAGNOSIS — C3411 Malignant neoplasm of upper lobe, right bronchus or lung: Secondary | ICD-10-CM

## 2017-02-06 DIAGNOSIS — Z8521 Personal history of malignant neoplasm of larynx: Secondary | ICD-10-CM

## 2017-02-06 LAB — COMPREHENSIVE METABOLIC PANEL
ALT: 13 U/L (ref 0–55)
AST: 33 U/L (ref 5–34)
Albumin: 3.2 g/dL — ABNORMAL LOW (ref 3.5–5.0)
Alkaline Phosphatase: 161 U/L — ABNORMAL HIGH (ref 40–150)
Anion Gap: 8 mEq/L (ref 3–11)
BUN: 11.2 mg/dL (ref 7.0–26.0)
CO2: 26 meq/L (ref 22–29)
Calcium: 9.2 mg/dL (ref 8.4–10.4)
Chloride: 107 mEq/L (ref 98–109)
Creatinine: 0.8 mg/dL (ref 0.7–1.3)
GLUCOSE: 96 mg/dL (ref 70–140)
POTASSIUM: 3.6 meq/L (ref 3.5–5.1)
SODIUM: 141 meq/L (ref 136–145)
Total Bilirubin: 0.45 mg/dL (ref 0.20–1.20)
Total Protein: 7.1 g/dL (ref 6.4–8.3)

## 2017-02-06 LAB — CBC WITH DIFFERENTIAL/PLATELET
BASO%: 0.9 % (ref 0.0–2.0)
BASOS ABS: 0.1 10*3/uL (ref 0.0–0.1)
EOS ABS: 0.1 10*3/uL (ref 0.0–0.5)
EOS%: 1.3 % (ref 0.0–7.0)
HCT: 36 % — ABNORMAL LOW (ref 38.4–49.9)
HGB: 11.4 g/dL — ABNORMAL LOW (ref 13.0–17.1)
LYMPH%: 9.9 % — AB (ref 14.0–49.0)
MCH: 22.4 pg — AB (ref 27.2–33.4)
MCHC: 31.7 g/dL — ABNORMAL LOW (ref 32.0–36.0)
MCV: 70.7 fL — AB (ref 79.3–98.0)
MONO#: 0.6 10*3/uL (ref 0.1–0.9)
MONO%: 8 % (ref 0.0–14.0)
NEUT%: 79.9 % — ABNORMAL HIGH (ref 39.0–75.0)
NEUTROS ABS: 5.5 10*3/uL (ref 1.5–6.5)
Platelets: 185 10*3/uL (ref 140–400)
RBC: 5.09 10*6/uL (ref 4.20–5.82)
RDW: 24.9 % — ABNORMAL HIGH (ref 11.0–14.6)
WBC: 6.9 10*3/uL (ref 4.0–10.3)
lymph#: 0.7 10*3/uL — ABNORMAL LOW (ref 0.9–3.3)

## 2017-02-06 LAB — TSH: TSH: 3.313 m(IU)/L (ref 0.320–4.118)

## 2017-02-06 MED ORDER — SODIUM CHLORIDE 0.9 % IV SOLN
Freq: Once | INTRAVENOUS | Status: AC
  Administered 2017-02-06: 12:00:00 via INTRAVENOUS

## 2017-02-06 MED ORDER — SODIUM CHLORIDE 0.9 % IV SOLN
1200.0000 mg | Freq: Once | INTRAVENOUS | Status: AC
  Administered 2017-02-06: 1200 mg via INTRAVENOUS
  Filled 2017-02-06: qty 20

## 2017-02-06 NOTE — Progress Notes (Signed)
[place] [phone]   [phone]  OFFICE PROGRESS NOTE  [name], MD [place] [address]  PRINCIPAL DIAGNOSES:  1. 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 2.  metastatic non-small cell lung cancer initially diagnosed as Stage IA non-small-cell lung cancer diagnosed in June 2011.  PRIOR THERAPY:  1. Status post total laryngectomy with. Bilateral selective lymph node dissection as well as left superior parotidectomy with nerve dissection under the care of Dr. Redmond Baseman with positive resection margin. 2. Status post course of concurrent chemoradiation with weekly cisplatin. Last dose of chemotherapy was given January 08, 2007. 3. Status post curative radiotherapy with tomotherapy to the lung lesions completed December 31, 2009 under the care of Dr. Pablo Ledger. 4. Systemic chemotherapy with carboplatin for an AUC of 5 and paclitaxel 175 mg meter squared. Status post 6 cycles.   CURRENT THERAPY: Treatment with immunotherapy with Tecentriq (Atezolizumab) 1200 MG IV every 3 weeks. First dose 08/22/2016. Status post 8 cycles.  INTERVAL HISTORY: [name] 76 y.o. male returns to the clinic today for follow-up visit. The patient is feeling fine today with no specific complaints. He continues to tolerate his treatment with immunotherapy fairly well. He denied having any skin rash or diarrhea. He has no chest pain, shortness of breath, cough or hemoptysis. He denied having any fever or chills. He has no nausea, vomiting, constipation. He is here today for evaluation before starting cycle #9.   MEDICAL HISTORY: Past Medical History:  Diagnosis Date  . Cancer ([place]) 2009   SUPERGLOTTIS INVASIVE SQ CELL CA  . Deficiency anemia 08/15/2016  . Encounter for antineoplastic immunotherapy 08/15/2016  . Full code status 03/17/2015  . Goals of care, counseling/discussion 08/15/2016  .  Hypertension   . Stroke [facility]) 2002   left side weakness    ALLERGIES:  has No Known Allergies.  MEDICATIONS:  Current Outpatient Prescriptions  Medication Sig Dispense Refill  . albuterol (PROVENTIL) (2.5 MG/3ML) 0.083% nebulizer solution USE 1 VIAL (3 ML) BY NEBULIZATION EVERY 6 HOURS AS NEEDED FOR WHEEZING OR SHORTNESS OF BREATH 360 mL 0  . allopurinol (ZYLOPRIM) 100 MG tablet Take 1 tablet (100 mg total) by mouth daily. 90 tablet 3  . amLODipine (NORVASC) 5 MG tablet Take 1 tablet (5 mg total) by mouth at bedtime. 90 tablet 2  . aspirin 81 MG tablet Take 81 mg by mouth every morning.     . baclofen (LIORESAL) 10 MG tablet Take 1 tablet (10 mg total) by mouth at bedtime. 90 each 3  . cholecalciferol (VITAMIN D) 1000 UNITS tablet Take 1,000 Units by mouth every morning.     . colchicine 0.6 MG tablet Take 1 tablet (0.6 mg total) by mouth 2 (two) times daily. 60 tablet 11  . diphenhydrAMINE (BENADRYL) 25 MG tablet Take 50 mg by mouth every 8 (eight) hours as needed.    . finasteride (PROSCAR) 5 MG tablet Take 1 tablet (5 mg total) by mouth every morning. 90 tablet 2  . loratadine (CLARITIN) 10 MG tablet Take 10 mg by mouth daily.    . metoprolol succinate (TOPROL-XL) 25 MG 24 hr tablet Take 1 tablet (25 mg total) by mouth every morning. Keep 9/17 Appointment for further refills 90 tablet 0  . montelukast (SINGULAIR) 10 MG tablet TAKE 1 TABLET BY MOUTH ONCE DAILY AT BEDTIME 90 tablet 1  . simvastatin (ZOCOR) 20 MG tablet  Take 1 tablet (20 mg total) by mouth every evening. 90 tablet 3  . tadalafil (CIALIS) 5 MG tablet Take 1 tablet (5 mg total) by mouth daily. 90 tablet 3  . tiZANidine (ZANAFLEX) 4 MG tablet Take 1 tablet (4 mg total) by mouth 3 (three) times daily as needed for muscle spasms. (Patient not taking: Reported on 02/06/2017) 90 tablet 1   No current facility-administered medications for this visit.     SURGICAL HISTORY:  Past Surgical History:  Procedure Laterality Date  .  TRACHEOSTOMY  2009    REVIEW OF SYSTEMS:  A comprehensive review of systems was negative except for: Constitutional: positive for fatigue Musculoskeletal: positive for muscle weakness   PHYSICAL EXAMINATION: General appearance: alert, cooperative, fatigued and no distress Head: Normocephalic, without obvious abnormality, atraumatic Neck: no adenopathy, no JVD, supple, symmetrical, trachea midline and thyroid not enlarged, symmetric, no tenderness/mass/nodules Lymph nodes: Cervical, supraclavicular, and axillary nodes normal. Resp: clear to auscultation bilaterally Back: symmetric, no curvature. ROM normal. No CVA tenderness. Cardio: regular rate and rhythm, S1, S2 normal, no murmur, click, rub or gallop GI: soft, non-tender; bowel sounds normal; no masses,  no organomegaly Extremities: extremities normal, atraumatic, no cyanosis or edema  ECOG PERFORMANCE STATUS: 1 - Symptomatic but completely ambulatory  Blood pressure (!) 145/88, pulse 80, temperature 98.9 F (37.2 C), temperature source Oral, resp. rate 18, height 5\' 6"  (1.676 m), weight 144 lb 11.2 oz (65.6 kg), SpO2 97 %.  LABORATORY DATA: Lab Results  Component Value Date   WBC 6.9 02/06/2017   HGB 11.4 (L) 02/06/2017   HCT 36.0 (L) 02/06/2017   MCV 70.7 (L) 02/06/2017   PLT 185 02/06/2017      Chemistry      Component Value Date/Time   NA 139 01/16/2017 1053   K 3.8 01/16/2017 1053   CL 103 08/02/2012 1107   CO2 25 01/16/2017 1053   BUN 12.5 01/16/2017 1053   CREATININE 0.8 01/16/2017 1053      Component Value Date/Time   CALCIUM 9.4 01/16/2017 1053   ALKPHOS 157 (H) 01/16/2017 1053   AST 30 01/16/2017 1053   ALT 14 01/16/2017 1053   BILITOT 0.47 01/16/2017 1053       RADIOGRAPHIC STUDIES: No results found.  ASSESSMENT AND PLAN:   This is a very pleasant 76 years old African-American male with metastatic non-small cell lung cancer, squamous cell carcinoma with significant liver metastasis. He is status  post systemic chemotherapy was carboplatin and paclitaxel followed by observation followed by disease progression. He is currently undergoing treatment with immunotherapy with Tecentriq [name]) status post 8 cycles and has been tolerating the treatment well. I recommended for the patient to proceed with cycle #9 today. I will see him back for follow-up visit in 3 weeks for reevaluation before the next dose of his treatment with Imfinzi (Durvalumab). For hypertension he will continue with his current blood pressure medication and he was advised to monitor his blood pressure closed at home. The patient was advised to call immediately if he has any concerning symptoms in the interval. The patient voices understanding of current disease status and treatment options and is in agreement with the current care plan. All questions were answered. The patient knows to call the clinic with any problems, questions or concerns. We can certainly see the patient much sooner if necessary. I spent 10 minutes counseling the patient face to face. The total time spent in the appointment was 15 minutes.  Disclaimer: This note  was dictated with voice recognition software. Similar sounding words can inadvertently be transcribed and may not be corrected upon review.

## 2017-02-06 NOTE — Telephone Encounter (Signed)
Gave patient avs and calendar with upcoming appts.  °

## 2017-02-06 NOTE — Patient Instructions (Signed)
[place] Discharge Instructions for Patients Receiving Chemotherapy  Today you received the following chemotherapy agents Tecentriq  To help prevent nausea and vomiting after your treatment, we encourage you to take your nausea medication as directed  If you develop nausea and vomiting that is not controlled by your nausea medication, call the clinic.   BELOW ARE SYMPTOMS THAT SHOULD BE REPORTED IMMEDIATELY:  *FEVER GREATER THAN 100.5 F  *CHILLS WITH OR WITHOUT FEVER  NAUSEA AND VOMITING THAT IS NOT CONTROLLED WITH YOUR NAUSEA MEDICATION  *UNUSUAL SHORTNESS OF BREATH  *UNUSUAL BRUISING OR BLEEDING  TENDERNESS IN MOUTH AND THROAT WITH OR WITHOUT PRESENCE OF ULCERS  *URINARY PROBLEMS  *BOWEL PROBLEMS  UNUSUAL RASH Items with * indicate a potential emergency and should be followed up as soon as possible.  Feel free to call the clinic you have any questions or concerns. The clinic phone number is [phone].  Please show the [place] at check-in to the Emergency Department and triage nurse.

## 2017-02-26 ENCOUNTER — Encounter: Payer: Self-pay | Admitting: Internal Medicine

## 2017-02-26 ENCOUNTER — Ambulatory Visit (INDEPENDENT_AMBULATORY_CARE_PROVIDER_SITE_OTHER): Payer: Medicare Other | Admitting: Internal Medicine

## 2017-02-26 VITALS — BP 130/80 | HR 85 | Temp 98.4°F | Ht 66.0 in | Wt 144.0 lb

## 2017-02-26 DIAGNOSIS — I10 Essential (primary) hypertension: Secondary | ICD-10-CM | POA: Diagnosis not present

## 2017-02-26 DIAGNOSIS — I69359 Hemiplegia and hemiparesis following cerebral infarction affecting unspecified side: Secondary | ICD-10-CM

## 2017-02-26 DIAGNOSIS — K769 Liver disease, unspecified: Secondary | ICD-10-CM | POA: Diagnosis not present

## 2017-02-26 DIAGNOSIS — Z23 Encounter for immunization: Secondary | ICD-10-CM | POA: Diagnosis not present

## 2017-02-26 DIAGNOSIS — I69354 Hemiplegia and hemiparesis following cerebral infarction affecting left non-dominant side: Secondary | ICD-10-CM | POA: Diagnosis not present

## 2017-02-26 DIAGNOSIS — C329 Malignant neoplasm of larynx, unspecified: Secondary | ICD-10-CM

## 2017-02-26 DIAGNOSIS — M1A9XX0 Chronic gout, unspecified, without tophus (tophi): Secondary | ICD-10-CM | POA: Diagnosis not present

## 2017-02-26 DIAGNOSIS — R269 Unspecified abnormalities of gait and mobility: Secondary | ICD-10-CM

## 2017-02-26 DIAGNOSIS — Z8673 Personal history of transient ischemic attack (TIA), and cerebral infarction without residual deficits: Secondary | ICD-10-CM

## 2017-02-26 MED ORDER — METOPROLOL SUCCINATE ER 25 MG PO TB24: 25.0000 mg | ORAL_TABLET | Freq: Every morning | ORAL | 3 refills | Status: DC

## 2017-02-26 MED ORDER — COLCHICINE 0.6 MG PO TABS: 0.6000 mg | ORAL_TABLET | Freq: Two times a day (BID) | ORAL | 11 refills | Status: AC

## 2017-02-26 MED ORDER — BACLOFEN 10 MG PO TABS: 10.0000 mg | ORAL_TABLET | Freq: Every day | ORAL | 3 refills | Status: DC

## 2017-02-26 NOTE — Patient Instructions (Addendum)
We have sent in the refills and given you the wheelchair prescription.

## 2017-02-26 NOTE — Assessment & Plan Note (Signed)
Rx for allopurinol refilled and colchicine as needed. No flare in the last 6 months.

## 2017-02-26 NOTE — Assessment & Plan Note (Signed)
Rx for manual wheelchair as his is 76 years old and falling apart. Not able to use other mobility device to do ADLs in the home.

## 2017-02-26 NOTE — Progress Notes (Signed)
   Subjective:    Patient ID: [name], male    DOB: 1941/01/13, 76 y.o.   MRN: [id number]  HPI The patient is a 76 YO man coming in for follow up of his blood pressure (at goal on his amlodipine, metoprolol, no side effects, denies headaches, chest pains or SOB), and hemiplegia (needs new wheelchair as his is falling apart, manual wheelchair, he is unable to use walker due to the hemiplegia, denies falls while using wheelchair), and his gout  (using allopurinol and no flares since last visit, has had flares several times in the past).  Review of Systems  Constitutional: Negative for activity change, appetite change, diaphoresis, fatigue, fever and unexpected weight change.  HENT: Positive for congestion and postnasal drip. Negative for rhinorrhea, sneezing, sore throat, tinnitus and trouble swallowing.   Eyes: Negative.   Respiratory: Positive for cough. Negative for apnea, chest tightness, shortness of breath and wheezing.   Cardiovascular: Negative.   Gastrointestinal: Negative.   Musculoskeletal: Negative.   Skin: Negative.   Neurological: Positive for weakness.  Psychiatric/Behavioral: Negative.       Objective:   Physical Exam  Constitutional: He is oriented to person, place, and time. He appears well-developed and well-nourished.  Manual wheelchair  HENT:  Head: Normocephalic and atraumatic.  Eyes: EOM are normal.  Neck: Normal range of motion.  Cardiovascular: Normal rate and regular rhythm.   Pulmonary/Chest: Effort normal and breath sounds normal. No respiratory distress. He has no wheezes. He has no rales.  Trach in place and flutter valve to talk  Abdominal: Soft. Bowel sounds are normal. He exhibits no distension. There is no tenderness. There is no rebound.  Musculoskeletal: He exhibits no edema.  Neurological: He is alert and oriented to person, place, and time. Coordination abnormal.  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  Psychiatric: He has a normal mood and affect.   Vitals:   02/26/17 1324  BP: 130/80  Pulse: 85  Temp: 98.4 F (36.9 C)  TempSrc: Oral  SpO2: 98%  Weight: 144 lb (65.3 kg)  Height: 5\' 6"  (1.676 m)      Assessment & Plan:  Flu shot given at visit

## 2017-02-26 NOTE — Assessment & Plan Note (Signed)
BP at goal on amlodipine, metoprolol without side effects. Last CMP without indication for change.

## 2017-02-27 ENCOUNTER — Other Ambulatory Visit (HOSPITAL_BASED_OUTPATIENT_CLINIC_OR_DEPARTMENT_OTHER): Payer: Medicare Other

## 2017-02-27 ENCOUNTER — Ambulatory Visit (HOSPITAL_BASED_OUTPATIENT_CLINIC_OR_DEPARTMENT_OTHER): Payer: Medicare Other | Admitting: Internal Medicine

## 2017-02-27 ENCOUNTER — Telehealth: Payer: Self-pay | Admitting: Internal Medicine

## 2017-02-27 ENCOUNTER — Ambulatory Visit (HOSPITAL_BASED_OUTPATIENT_CLINIC_OR_DEPARTMENT_OTHER): Payer: Medicare Other

## 2017-02-27 ENCOUNTER — Encounter: Payer: Self-pay | Admitting: Internal Medicine

## 2017-02-27 VITALS — BP 118/65 | HR 111 | Temp 98.3°F | Resp 18 | Ht 66.0 in | Wt 143.6 lb

## 2017-02-27 DIAGNOSIS — C3411 Malignant neoplasm of upper lobe, right bronchus or lung: Secondary | ICD-10-CM

## 2017-02-27 DIAGNOSIS — Z5112 Encounter for antineoplastic immunotherapy: Secondary | ICD-10-CM | POA: Diagnosis not present

## 2017-02-27 DIAGNOSIS — Z8521 Personal history of malignant neoplasm of larynx: Secondary | ICD-10-CM | POA: Diagnosis not present

## 2017-02-27 DIAGNOSIS — Z79899 Other long term (current) drug therapy: Secondary | ICD-10-CM

## 2017-02-27 DIAGNOSIS — Z923 Personal history of irradiation: Secondary | ICD-10-CM

## 2017-02-27 DIAGNOSIS — C3490 Malignant neoplasm of unspecified part of unspecified bronchus or lung: Secondary | ICD-10-CM

## 2017-02-27 DIAGNOSIS — C787 Secondary malignant neoplasm of liver and intrahepatic bile duct: Secondary | ICD-10-CM

## 2017-02-27 LAB — CBC WITH DIFFERENTIAL/PLATELET
BASO%: 0.6 % (ref 0.0–2.0)
BASOS ABS: 0.1 10*3/uL (ref 0.0–0.1)
EOS ABS: 0.1 10*3/uL (ref 0.0–0.5)
EOS%: 1.4 % (ref 0.0–7.0)
HEMATOCRIT: 37.7 % — AB (ref 38.4–49.9)
HEMOGLOBIN: 11.9 g/dL — AB (ref 13.0–17.1)
LYMPH#: 0.7 10*3/uL — AB (ref 0.9–3.3)
LYMPH%: 7.3 % — ABNORMAL LOW (ref 14.0–49.0)
MCH: 22.7 pg — ABNORMAL LOW (ref 27.2–33.4)
MCHC: 31.6 g/dL — ABNORMAL LOW (ref 32.0–36.0)
MCV: 71.9 fL — AB (ref 79.3–98.0)
MONO#: 0.5 10*3/uL (ref 0.1–0.9)
MONO%: 5.7 % (ref 0.0–14.0)
NEUT#: 8 10*3/uL — ABNORMAL HIGH (ref 1.5–6.5)
NEUT%: 85 % — AB (ref 39.0–75.0)
NRBC: 0 % (ref 0–0)
Platelets: 188 10*3/uL (ref 140–400)
RBC: 5.24 10*6/uL (ref 4.20–5.82)
RDW: 21.3 % — AB (ref 11.0–14.6)
WBC: 9.4 10*3/uL (ref 4.0–10.3)

## 2017-02-27 LAB — COMPREHENSIVE METABOLIC PANEL
ALBUMIN: 3.5 g/dL (ref 3.5–5.0)
ALK PHOS: 172 U/L — AB (ref 40–150)
ALT: 14 U/L (ref 0–55)
AST: 37 U/L — AB (ref 5–34)
Anion Gap: 11 mEq/L (ref 3–11)
BUN: 12.9 mg/dL (ref 7.0–26.0)
CALCIUM: 9.4 mg/dL (ref 8.4–10.4)
CO2: 24 mEq/L (ref 22–29)
Chloride: 104 mEq/L (ref 98–109)
Creatinine: 0.8 mg/dL (ref 0.7–1.3)
GLUCOSE: 123 mg/dL (ref 70–140)
Potassium: 3.8 mEq/L (ref 3.5–5.1)
SODIUM: 139 meq/L (ref 136–145)
TOTAL PROTEIN: 7.5 g/dL (ref 6.4–8.3)
Total Bilirubin: 0.54 mg/dL (ref 0.20–1.20)

## 2017-02-27 LAB — TSH: TSH: 3.056 m[IU]/L (ref 0.320–4.118)

## 2017-02-27 MED ORDER — SODIUM CHLORIDE 0.9 % IV SOLN
Freq: Once | INTRAVENOUS | Status: AC
  Administered 2017-02-27: 12:00:00 via INTRAVENOUS

## 2017-02-27 MED ORDER — SODIUM CHLORIDE 0.9 % IV SOLN
1200.0000 mg | Freq: Once | INTRAVENOUS | Status: AC
  Administered 2017-02-27: 1200 mg via INTRAVENOUS
  Filled 2017-02-27: qty 20

## 2017-02-27 NOTE — Progress Notes (Signed)
[facility] [phone]   Fax:([phone]  OFFICE PROGRESS NOTE  [name], MD [place] [address]  PRINCIPAL DIAGNOSES:  1. 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 2.  metastatic non-small cell lung cancer initially diagnosed as Stage IA non-small-cell lung cancer diagnosed in June 2011.  PRIOR THERAPY:  1. Status post total laryngectomy with. Bilateral selective lymph node dissection as well as left superior parotidectomy with nerve dissection under the care of Dr. Redmond Baseman with positive resection margin. 2. Status post course of concurrent chemoradiation with weekly cisplatin. Last dose of chemotherapy was given January 08, 2007. 3. Status post curative radiotherapy with tomotherapy to the lung lesions completed December 31, 2009 under the care of Dr. Pablo Ledger. 4. Systemic chemotherapy with carboplatin for an AUC of 5 and paclitaxel 175 mg meter squared. Status post 6 cycles.   CURRENT THERAPY: Treatment with immunotherapy with Tecentriq (Atezolizumab) 1200 MG IV every 3 weeks. First dose 08/22/2016. Status post 9 cycles.  INTERVAL HISTORY: [name] 76 y.o. male returns to the clinic today for follow-up visit. The patient has no complaints today. He continues to tolerate his immunotherapy with Tecentriq Acupuncturist) fairly well. He denied having any chest pain, shortness of breath, cough or hemoptysis. He denied having any fever or chills. He has no nausea, vomiting, diarrhea or constipation. He denied having any skin rash. He is here today for evaluation before starting cycle #10.  MEDICAL HISTORY: Past Medical History:  Diagnosis Date  . Cancer ([place]) 2009   SUPERGLOTTIS INVASIVE SQ CELL CA  . Deficiency anemia 08/15/2016  . Encounter for antineoplastic immunotherapy 08/15/2016  . Full code status 03/17/2015  . Goals of care, counseling/discussion 08/15/2016  .  Hypertension   . Stroke [facility]) 2002   left side weakness    ALLERGIES:  has No Known Allergies.  MEDICATIONS:  Current Outpatient Prescriptions  Medication Sig Dispense Refill  . albuterol (PROVENTIL) (2.5 MG/3ML) 0.083% nebulizer solution USE 1 VIAL (3 ML) BY NEBULIZATION EVERY 6 HOURS AS NEEDED FOR WHEEZING OR SHORTNESS OF BREATH 360 mL 0  . allopurinol (ZYLOPRIM) 100 MG tablet Take 1 tablet (100 mg total) by mouth daily. 90 tablet 3  . amLODipine (NORVASC) 5 MG tablet Take 1 tablet (5 mg total) by mouth at bedtime. 90 tablet 2  . aspirin 81 MG tablet Take 81 mg by mouth every morning.     . baclofen (LIORESAL) 10 MG tablet Take 1 tablet (10 mg total) by mouth at bedtime. 90 each 3  . cholecalciferol (VITAMIN D) 1000 UNITS tablet Take 1,000 Units by mouth every morning.     . colchicine 0.6 MG tablet Take 1 tablet (0.6 mg total) by mouth 2 (two) times daily. 60 tablet 11  . diphenhydrAMINE (BENADRYL) 25 MG tablet Take 50 mg by mouth every 8 (eight) hours as needed.    . finasteride (PROSCAR) 5 MG tablet Take 1 tablet (5 mg total) by mouth every morning. 90 tablet 2  . loratadine (CLARITIN) 10 MG tablet Take 10 mg by mouth daily.    . metoprolol succinate (TOPROL-XL) 25 MG 24 hr tablet Take 1 tablet (25 mg total) by mouth every morning. 90 tablet 3  . montelukast (SINGULAIR) 10 MG tablet TAKE 1 TABLET BY MOUTH ONCE DAILY AT BEDTIME 90 tablet 1  . simvastatin (ZOCOR) 20 MG tablet Take 1 tablet (20 mg total) by mouth every  evening. 90 tablet 3  . tiZANidine (ZANAFLEX) 4 MG tablet Take 1 tablet (4 mg total) by mouth 3 (three) times daily as needed for muscle spasms. 90 tablet 1  . tadalafil (CIALIS) 5 MG tablet Take 1 tablet (5 mg total) by mouth daily. 90 tablet 3   No current facility-administered medications for this visit.     SURGICAL HISTORY:  Past Surgical History:  Procedure Laterality Date  . TRACHEOSTOMY  2009    REVIEW OF SYSTEMS:  A comprehensive review of systems was  negative except for: Constitutional: positive for fatigue Musculoskeletal: positive for muscle weakness   PHYSICAL EXAMINATION: General appearance: alert, cooperative, fatigued and no distress Head: Normocephalic, without obvious abnormality, atraumatic Neck: no adenopathy, no JVD, supple, symmetrical, trachea midline and thyroid not enlarged, symmetric, no tenderness/mass/nodules Lymph nodes: Cervical, supraclavicular, and axillary nodes normal. Resp: clear to auscultation bilaterally Back: symmetric, no curvature. ROM normal. No CVA tenderness. Cardio: regular rate and rhythm, S1, S2 normal, no murmur, click, rub or gallop GI: soft, non-tender; bowel sounds normal; no masses,  no organomegaly Extremities: extremities normal, atraumatic, no cyanosis or edema  ECOG PERFORMANCE STATUS: 1 - Symptomatic but completely ambulatory  Blood pressure 118/65, pulse (!) 111, temperature 98.3 F (36.8 C), temperature source Oral, resp. rate 18, height 5\' 6"  (1.676 m), weight 143 lb 9.6 oz (65.1 kg), SpO2 97 %.  LABORATORY DATA: Lab Results  Component Value Date   WBC 9.4 02/27/2017   HGB 11.9 (L) 02/27/2017   HCT 37.7 (L) 02/27/2017   MCV 71.9 (L) 02/27/2017   PLT 188 02/27/2017      Chemistry      Component Value Date/Time   NA 139 02/27/2017 1030   K 3.8 02/27/2017 1030   CL 103 08/02/2012 1107   CO2 24 02/27/2017 1030   BUN 12.9 02/27/2017 1030   CREATININE 0.8 02/27/2017 1030      Component Value Date/Time   CALCIUM 9.4 02/27/2017 1030   ALKPHOS 172 (H) 02/27/2017 1030   AST 37 (H) 02/27/2017 1030   ALT 14 02/27/2017 1030   BILITOT 0.54 02/27/2017 1030       RADIOGRAPHIC STUDIES: No results found.  ASSESSMENT AND PLAN:   This is a very pleasant 76 years old African-American male with metastatic non-small cell lung cancer, squamous cell carcinoma with significant liver metastasis. He is status post systemic chemotherapy was carboplatin and paclitaxel followed by  observation followed by disease progression. He is currently undergoing treatment with immunotherapy with Tecentriq [name]) status post 9 cycles He tolerated the last cycle of his treatment well. I recommended for him to proceed with cycle #10 today. I will see him back for follow-up visit in 3 weeks for evaluation before starting cycle #11 after repeating CT scan of the chest, abdomen and pelvis for restaging of his disease. The patient was advised to call immediately if he has any concerning symptoms in the interval. The patient voices understanding of current disease status and treatment options and is in agreement with the current care plan. All questions were answered. The patient knows to call the clinic with any problems, questions or concerns. We can certainly see the patient much sooner if necessary. I spent 10 minutes counseling the patient face to face. The total time spent in the appointment was 15 minutes.  Disclaimer: This note was dictated with voice recognition software. Similar sounding words can inadvertently be transcribed and may not be corrected upon review.

## 2017-02-27 NOTE — Patient Instructions (Signed)
[place] [facility] Discharge Instructions for Patients Receiving Chemotherapy  Today you received the following chemotherapy agents Tecentriq  To help prevent nausea and vomiting after your treatment, we encourage you to take your nausea medication as directed.    If you develop nausea and vomiting that is not controlled by your nausea medication, call the clinic.   BELOW ARE SYMPTOMS THAT SHOULD BE REPORTED IMMEDIATELY:  *FEVER GREATER THAN 100.5 F  *CHILLS WITH OR WITHOUT FEVER  NAUSEA AND VOMITING THAT IS NOT CONTROLLED WITH YOUR NAUSEA MEDICATION  *UNUSUAL SHORTNESS OF BREATH  *UNUSUAL BRUISING OR BLEEDING  TENDERNESS IN MOUTH AND THROAT WITH OR WITHOUT PRESENCE OF ULCERS  *URINARY PROBLEMS  *BOWEL PROBLEMS  UNUSUAL RASH Items with * indicate a potential emergency and should be followed up as soon as possible.  Feel free to call the clinic you have any questions or concerns. The clinic phone number is [phone].  Please show the [place] at check-in to the Emergency Department and triage nurse.

## 2017-02-27 NOTE — Telephone Encounter (Signed)
Scheduled appt per 9/18 los - patient to get updated scheduled next visit.

## 2017-03-16 ENCOUNTER — Ambulatory Visit (HOSPITAL_COMMUNITY)
Admission: RE | Admit: 2017-03-16 | Discharge: 2017-03-16 | Disposition: A | Discharge status: Home / Self Care | Payer: Medicare Other | Source: Ambulatory Visit | Attending: Internal Medicine | Admitting: Internal Medicine

## 2017-03-16 ENCOUNTER — Encounter (HOSPITAL_COMMUNITY): Payer: Self-pay

## 2017-03-16 DIAGNOSIS — I2510 Atherosclerotic heart disease of native coronary artery without angina pectoris: Secondary | ICD-10-CM | POA: Diagnosis not present

## 2017-03-16 DIAGNOSIS — Z5112 Encounter for antineoplastic immunotherapy: Secondary | ICD-10-CM

## 2017-03-16 DIAGNOSIS — I700 Atherosclerosis of aorta: Secondary | ICD-10-CM | POA: Insufficient documentation

## 2017-03-16 DIAGNOSIS — C3411 Malignant neoplasm of upper lobe, right bronchus or lung: Secondary | ICD-10-CM | POA: Insufficient documentation

## 2017-03-16 DIAGNOSIS — C3490 Malignant neoplasm of unspecified part of unspecified bronchus or lung: Secondary | ICD-10-CM

## 2017-03-16 DIAGNOSIS — J439 Emphysema, unspecified: Secondary | ICD-10-CM | POA: Diagnosis not present

## 2017-03-16 DIAGNOSIS — I714 Abdominal aortic aneurysm, without rupture: Secondary | ICD-10-CM | POA: Diagnosis not present

## 2017-03-16 MED ORDER — IOPAMIDOL (ISOVUE-300) INJECTION 61%
INTRAVENOUS | Status: AC
  Administered 2017-03-16: 100 mL via INTRAVENOUS
  Filled 2017-03-16: qty 100

## 2017-03-16 MED ORDER — IOPAMIDOL (ISOVUE-300) INJECTION 61%
100.0000 mL | Freq: Once | INTRAVENOUS | Status: AC | PRN
  Administered 2017-03-16: 100 mL via INTRAVENOUS

## 2017-03-19 ENCOUNTER — Telehealth: Payer: Self-pay | Admitting: Internal Medicine

## 2017-03-19 ENCOUNTER — Other Ambulatory Visit: Payer: Self-pay | Admitting: Internal Medicine

## 2017-03-19 NOTE — Telephone Encounter (Signed)
We gave him prescription at last office visit.

## 2017-03-19 NOTE — Telephone Encounter (Signed)
Pt called stating that he is needing a wheelchair order sent over to [facility] (fax # [phone]).

## 2017-03-20 ENCOUNTER — Encounter: Payer: Self-pay | Admitting: Oncology

## 2017-03-20 ENCOUNTER — Other Ambulatory Visit (HOSPITAL_BASED_OUTPATIENT_CLINIC_OR_DEPARTMENT_OTHER): Payer: Medicare Other

## 2017-03-20 ENCOUNTER — Ambulatory Visit (HOSPITAL_BASED_OUTPATIENT_CLINIC_OR_DEPARTMENT_OTHER): Payer: Medicare Other | Admitting: Oncology

## 2017-03-20 ENCOUNTER — Ambulatory Visit (HOSPITAL_BASED_OUTPATIENT_CLINIC_OR_DEPARTMENT_OTHER): Payer: Medicare Other

## 2017-03-20 VITALS — BP 124/80 | HR 87 | Temp 98.2°F | Resp 18 | Ht 66.0 in | Wt 143.6 lb

## 2017-03-20 DIAGNOSIS — C3411 Malignant neoplasm of upper lobe, right bronchus or lung: Secondary | ICD-10-CM

## 2017-03-20 DIAGNOSIS — Z5112 Encounter for antineoplastic immunotherapy: Secondary | ICD-10-CM

## 2017-03-20 DIAGNOSIS — C787 Secondary malignant neoplasm of liver and intrahepatic bile duct: Secondary | ICD-10-CM | POA: Diagnosis not present

## 2017-03-20 DIAGNOSIS — Z79899 Other long term (current) drug therapy: Secondary | ICD-10-CM | POA: Diagnosis not present

## 2017-03-20 LAB — CBC WITH DIFFERENTIAL/PLATELET
BASO%: 0.7 % (ref 0.0–2.0)
Basophils Absolute: 0.1 10*3/uL (ref 0.0–0.1)
EOS%: 1.7 % (ref 0.0–7.0)
Eosinophils Absolute: 0.1 10*3/uL (ref 0.0–0.5)
HEMATOCRIT: 35.8 % — AB (ref 38.4–49.9)
HEMOGLOBIN: 11.1 g/dL — AB (ref 13.0–17.1)
LYMPH#: 0.7 10*3/uL — AB (ref 0.9–3.3)
LYMPH%: 9.4 % — ABNORMAL LOW (ref 14.0–49.0)
MCH: 22.9 pg — ABNORMAL LOW (ref 27.2–33.4)
MCHC: 31 g/dL — ABNORMAL LOW (ref 32.0–36.0)
MCV: 73.8 fL — ABNORMAL LOW (ref 79.3–98.0)
MONO#: 0.6 10*3/uL (ref 0.1–0.9)
MONO%: 8.2 % (ref 0.0–14.0)
NEUT#: 6 10*3/uL (ref 1.5–6.5)
NEUT%: 80 % — ABNORMAL HIGH (ref 39.0–75.0)
PLATELETS: 184 10*3/uL (ref 140–400)
RBC: 4.85 10*6/uL (ref 4.20–5.82)
RDW: 20 % — ABNORMAL HIGH (ref 11.0–14.6)
WBC: 7.5 10*3/uL (ref 4.0–10.3)

## 2017-03-20 LAB — COMPREHENSIVE METABOLIC PANEL
ALT: 12 U/L (ref 0–55)
ANION GAP: 9 meq/L (ref 3–11)
AST: 36 U/L — ABNORMAL HIGH (ref 5–34)
Albumin: 3.4 g/dL — ABNORMAL LOW (ref 3.5–5.0)
Alkaline Phosphatase: 167 U/L — ABNORMAL HIGH (ref 40–150)
BILIRUBIN TOTAL: 0.46 mg/dL (ref 0.20–1.20)
BUN: 12.2 mg/dL (ref 7.0–26.0)
CALCIUM: 9.3 mg/dL (ref 8.4–10.4)
CO2: 25 mEq/L (ref 22–29)
CREATININE: 0.8 mg/dL (ref 0.7–1.3)
Chloride: 104 mEq/L (ref 98–109)
EGFR: >90 mL/min/{1.73_m2} (ref >90)
Glucose: 123 mg/dl (ref 70–140)
Potassium: 4 mEq/L (ref 3.5–5.1)
Sodium: 138 mEq/L (ref 136–145)
Total Protein: 7.2 g/dL (ref 6.4–8.3)

## 2017-03-20 LAB — TSH: TSH: 3.026 m[IU]/L (ref 0.320–4.118)

## 2017-03-20 MED ORDER — SODIUM CHLORIDE 0.9 % IV SOLN
Freq: Once | INTRAVENOUS | Status: AC
  Administered 2017-03-20: 12:00:00 via INTRAVENOUS

## 2017-03-20 MED ORDER — SODIUM CHLORIDE 0.9 % IV SOLN
1200.0000 mg | Freq: Once | INTRAVENOUS | Status: AC
  Administered 2017-03-20: 1200 mg via INTRAVENOUS
  Filled 2017-03-20: qty 20

## 2017-03-20 NOTE — Progress Notes (Signed)
[facility] Follow up:    [name], MD [place] [address]   DIAGNOSIS:  1. 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 2.  metastatic non-small cell lung cancer initially diagnosed as Stage IA non-small-cell lung cancer diagnosed in June 2011. 3.  SUMMARY OF ONCOLOGIC HISTORY:  No history exists.   PRIOR THERAPY:  1. Status post total laryngectomy with. Bilateral selective lymph node dissection as well as left superior parotidectomy with nerve dissection under the care of Dr. Redmond Baseman with positive resection margin. 2. Status post course of concurrent chemoradiation with weekly cisplatin. Last dose of chemotherapy was given January 08, 2007. 3. Status post curative radiotherapy with tomotherapy to the lung lesions completed December 31, 2009 under the care of Dr. Pablo Ledger. 4. Systemic chemotherapy with carboplatin for an AUC of 5 and paclitaxel 175 mg meter squared. Status post 6 cycles.   CURRENT THERAPY: Treatment with immunotherapy with Tecentriq (Atezolizumab) 1200 MG IV every 3 weeks. First dose 08/22/2016. Status post 10 cycles.  INTERVAL HISTORY: KHAZA BLANSETT 76 y.o. male returns for routine follow-up by himself. The patient has no complaints today. He continues to tolerate his immunotherapy with Tecentriq Acupuncturist) fairly well. He denied having any chest pain, shortness of breath, cough or hemoptysis. He denied having any fever or chills. He has no nausea, vomiting, diarrhea or constipation. He denied having any skin rash. He is here today for evaluation before starting cycle #11 and to review his recent restaging CT scan results.   Patient Active Problem List   Diagnosis Date Noted  . Malignant neoplasm of upper lobe of right lung ([place]) 11/14/2016  . Encounter for antineoplastic immunotherapy 08/15/2016  . Goals of care, counseling/discussion 08/15/2016  . Deficiency anemia  08/15/2016  . Gout 02/16/2016  . Routine general medical examination at a health care facility 06/29/2015  . Hemiplegia as late effect of stroke ([place]) 06/29/2015  . Full code status 03/17/2015  . Metastases to the liver ([place]) 02/22/2015  . Gait instability 12/02/2014  . Essential hypertension 12/02/2014  . Cancer of right lung ([place]) 01/22/2012  . 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  MEDICAL HISTORY: Past Medical History:  Diagnosis Date  . Cancer ([place]) 2009   SUPERGLOTTIS INVASIVE SQ CELL CA  . Deficiency anemia 08/15/2016  . Encounter for antineoplastic immunotherapy 08/15/2016  . Full code status 03/17/2015  . Goals of care, counseling/discussion 08/15/2016  . Hypertension   . Stroke [facility]) 2002   left side weakness    SURGICAL HISTORY: Past Surgical History:  Procedure Laterality Date  . TRACHEOSTOMY  2009    SOCIAL HISTORY: Social History   Social History  . Marital status: Married    Spouse name: N/A  . Number of children: N/A  . Years of education: N/A   Occupational History  . Not on file.   Social History Main Topics  . Smoking status: Former Smoker    Quit date: 11/05/2003  . Smokeless tobacco: Never Used  . Alcohol use Yes     Comment: wine  . Drug use: No  . Sexual activity: No   Other Topics Concern  . Not on file   Social History Narrative  . No narrative on file    FAMILY HISTORY: Family History  Problem Relation Age of Onset  . Cancer Mother        lung    Review  of Systems  Constitutional: Negative.   HENT:  Negative.   Eyes: Negative.   Respiratory: Negative.   Cardiovascular: Negative.   Gastrointestinal: Negative.   Genitourinary: Negative.    Musculoskeletal: Negative.   Skin: Negative.   Neurological: Negative.   Hematological: Negative.   Psychiatric/Behavioral: Negative.       PHYSICAL EXAMINATION  ECOG PERFORMANCE STATUS: 1 - Symptomatic but completely ambulatory  Vitals:   03/20/17 1059   BP: 124/80  Pulse: 87  Resp: 18  Temp: 98.2 F (36.8 C)  SpO2: 97%    Physical Exam  Constitutional: He is oriented to person, place, and time and well-developed, well-nourished, and in no distress. No distress.  HENT:  Head: Normocephalic.  Mouth/Throat: Oropharynx is clear and moist. No oropharyngeal exudate.  Eyes: Conjunctivae are normal. Right eye exhibits no discharge. Left eye exhibits no discharge. No scleral icterus.  Neck: Normal range of motion. Neck supple.  Cardiovascular: Normal rate, regular rhythm, normal heart sounds and intact distal pulses.   Pulmonary/Chest: Effort normal and breath sounds normal. No respiratory distress. He has no wheezes. He has no rales.  Abdominal: Soft. Bowel sounds are normal. He exhibits no distension and no mass. There is no tenderness.  Musculoskeletal: Normal range of motion. He exhibits no edema.  Lymphadenopathy:    He has no cervical adenopathy.  Neurological: He is alert and oriented to person, place, and time. He exhibits normal muscle tone. Coordination normal.  Skin: Skin is warm and dry. No rash noted. He is not diaphoretic. No erythema. No pallor.  Psychiatric: Mood, memory, affect and judgment normal.  Vitals reviewed.   LABORATORY DATA:  CBC    Component Value Date/Time   WBC 7.5 03/20/2017 1037   WBC 8.0 10/15/2014 1145   RBC 4.85 03/20/2017 1037   RBC 5.10 10/15/2014 1145   HGB 11.1 (L) 03/20/2017 1037   HCT 35.8 (L) 03/20/2017 1037   PLT 184 03/20/2017 1037   MCV 73.8 (L) 03/20/2017 1037   MCH 22.9 (L) 03/20/2017 1037   MCH 26.3 10/15/2014 1145   MCHC 31.0 (L) 03/20/2017 1037   MCHC 32.9 10/15/2014 1145   RDW 20.0 (H) 03/20/2017 1037   LYMPHSABS 0.7 (L) 03/20/2017 1037   MONOABS 0.6 03/20/2017 1037   EOSABS 0.1 03/20/2017 1037   BASOSABS 0.1 03/20/2017 1037    CMP     Component Value Date/Time   NA 138 03/20/2017 1037   K 4.0 03/20/2017 1037   CL 103 08/02/2012 1107   CO2 25 03/20/2017 1037    GLUCOSE 123 03/20/2017 1037   GLUCOSE 119 (H) 08/02/2012 1107   BUN 12.2 03/20/2017 1037   CREATININE 0.8 03/20/2017 1037   CALCIUM 9.3 03/20/2017 1037   PROT 7.2 03/20/2017 1037   ALBUMIN 3.4 (L) 03/20/2017 1037   AST 36 (H) 03/20/2017 1037   ALT 12 03/20/2017 1037   ALKPHOS 167 (H) 03/20/2017 1037   BILITOT 0.46 03/20/2017 1037   GFRNONAA >60 07/16/2007 1102   GFRAA  07/16/2007 1102    >60        The eGFR has been calculated using the MDRD equation. This calculation has not been validated in all clinical   RADIOGRAPHIC STUDIES:  Ct Chest W Contrast  Result Date: 03/16/2017 CLINICAL DATA:  Supraglottic invasive squamous cell cancer status post laryngectomy in 2008. Metastatic non-small cell lung cancer diagnosis in 2011, restaging. EXAM: CT CHEST, ABDOMEN, AND PELVIS WITH CONTRAST TECHNIQUE: Multidetector CT imaging of the chest, abdomen and pelvis was  performed following the standard protocol during bolus administration of intravenous contrast. CONTRAST:  100 cc Isovue 300 COMPARISON:  Multiple exams, including 12/27/2016 FINDINGS: CT CHEST FINDINGS Cardiovascular: Coronary, aortic arch, and branch vessel atherosclerotic vascular disease. Mediastinum/Nodes: Tracheostomy site noted. Gaseous distention of the upper esophagus. Small mediastinal lymph nodes are not pathologically enlarged. Lungs/Pleura: Stable biapical scarring. Paraseptal emphysema. Of nodularity along the minor fissure 2.7 by 2.3 cm, previously 2.5 by 2.1 cm, although measurements are not highly act. Given the orientation of this lesion. The most part the pulmonary nodules are stable in size compared to 12/27/2016. The right lower lobe pulmonary nodule is minimally larger, currently measuring 8 by 7 mm and previously 7 by 7 mm. Considerable bilateral airway thickening is present. This is similar to prior. Musculoskeletal: Left rib deformities from old fractures. Prominent degenerative right sternoclavicular arthropathy with  underlying subcortical sclerosis. Degenerative glenohumeral arthropathy bilaterally. Thoracic spondylosis. CT ABDOMEN PELVIS FINDINGS Hepatobiliary: Tumor infiltration of all segments of the liver roughly similar distribution to the prior exam. Index lesion inferiorly in the right hepatic lobe measures 5.0 by 3.4 cm on image 74/2, previously 5.1 by 3.0 cm. Gallbladder unremarkable.  Common bile duct within normal limits. Pancreas: Speckled calcifications in the pancreatic tail. Otherwise unremarkable. Spleen: Unremarkable Adrenals/Urinary Tract: 2.2 by 1.7 cm cyst of the left kidney lower pole, similar to prior. Adrenal glands unremarkable. Stomach/Bowel: Unremarkable Vascular/Lymphatic: Infrarenal abdominal aortic aneurysm at the aortic bifurcation measures 4.4 by 4.2 cm, stable. Aortoiliac atherosclerotic vascular disease. Reproductive: Unremarkable Other: No supplemental non-categorized findings. Musculoskeletal: Lumbar spondylosis and degenerative disc disease. IMPRESSION: 1. Overall stable size in distribution of multiple hepatic masses and pulmonary nodules. No new nodules or significant progression. 2. Stable infrarenal abdominal aortic aneurysm, 4.4 cm. Recommend followup by ultrasound in 1 year. This recommendation follows ACR consensus guidelines: White Paper of the ACR Incidental Findings Committee II on Vascular Findings. J Am Coll Radiol 2013; 10:789-794. 3. Gaseous distention of the upper esophagus, possibly from dysmotility. 4. Aortic Atherosclerosis (ICD10-I70.0) and Emphysema (ICD10-J43.9). Coronary atherosclerosis. 5. Chronic considerable bilateral airway thickening. 6. Chronic degenerative right sternoclavicular arthropathy. 7. Speckled calcifications in the pancreatic tail, likely related to remote inflammation. Electronically Signed   By: Van Clines M.D.   On: 03/16/2017 16:19   Ct Abdomen Pelvis W Contrast  Result Date: 03/16/2017 CLINICAL DATA:  Supraglottic invasive squamous cell  cancer status post laryngectomy in 2008. Metastatic non-small cell lung cancer diagnosis in 2011, restaging. EXAM: CT CHEST, ABDOMEN, AND PELVIS WITH CONTRAST TECHNIQUE: Multidetector CT imaging of the chest, abdomen and pelvis was performed following the standard protocol during bolus administration of intravenous contrast. CONTRAST:  100 cc Isovue 300 COMPARISON:  Multiple exams, including 12/27/2016 FINDINGS: CT CHEST FINDINGS Cardiovascular: Coronary, aortic arch, and branch vessel atherosclerotic vascular disease. Mediastinum/Nodes: Tracheostomy site noted. Gaseous distention of the upper esophagus. Small mediastinal lymph nodes are not pathologically enlarged. Lungs/Pleura: Stable biapical scarring. Paraseptal emphysema. Of nodularity along the minor fissure 2.7 by 2.3 cm, previously 2.5 by 2.1 cm, although measurements are not highly act. Given the orientation of this lesion. The most part the pulmonary nodules are stable in size compared to 12/27/2016. The right lower lobe pulmonary nodule is minimally larger, currently measuring 8 by 7 mm and previously 7 by 7 mm. Considerable bilateral airway thickening is present. This is similar to prior. Musculoskeletal: Left rib deformities from old fractures. Prominent degenerative right sternoclavicular arthropathy with underlying subcortical sclerosis. Degenerative glenohumeral arthropathy bilaterally. Thoracic spondylosis. CT ABDOMEN PELVIS FINDINGS Hepatobiliary:  Tumor infiltration of all segments of the liver roughly similar distribution to the prior exam. Index lesion inferiorly in the right hepatic lobe measures 5.0 by 3.4 cm on image 74/2, previously 5.1 by 3.0 cm. Gallbladder unremarkable.  Common bile duct within normal limits. Pancreas: Speckled calcifications in the pancreatic tail. Otherwise unremarkable. Spleen: Unremarkable Adrenals/Urinary Tract: 2.2 by 1.7 cm cyst of the left kidney lower pole, similar to prior. Adrenal glands unremarkable.  Stomach/Bowel: Unremarkable Vascular/Lymphatic: Infrarenal abdominal aortic aneurysm at the aortic bifurcation measures 4.4 by 4.2 cm, stable. Aortoiliac atherosclerotic vascular disease. Reproductive: Unremarkable Other: No supplemental non-categorized findings. Musculoskeletal: Lumbar spondylosis and degenerative disc disease. IMPRESSION: 1. Overall stable size in distribution of multiple hepatic masses and pulmonary nodules. No new nodules or significant progression. 2. Stable infrarenal abdominal aortic aneurysm, 4.4 cm. Recommend followup by ultrasound in 1 year. This recommendation follows ACR consensus guidelines: White Paper of the ACR Incidental Findings Committee II on Vascular Findings. J Am Coll Radiol 2013; 10:789-794. 3. Gaseous distention of the upper esophagus, possibly from dysmotility. 4. Aortic Atherosclerosis (ICD10-I70.0) and Emphysema (ICD10-J43.9). Coronary atherosclerosis. 5. Chronic considerable bilateral airway thickening. 6. Chronic degenerative right sternoclavicular arthropathy. 7. Speckled calcifications in the pancreatic tail, likely related to remote inflammation. Electronically Signed   By: Van Clines M.D.   On: 03/16/2017 16:19   ASSESSMENT and THERAPY PLAN:   Malignant neoplasm of upper lobe of right lung [facility]) This is a very pleasant 76 year old African-American male with metastatic non-small cell lung cancer, squamous cell carcinoma with significant liver metastasis. He is status post systemic chemotherapy was carboplatin and paclitaxel followed by observation followed by disease progression.  He is currently undergoing treatment with immunotherapy with Tecentriq [name]) status post 10 cycles. He tolerated the last cycle of his treatment well.  CT scan results were discussed with patient. I explained to him that his disease is stable. Recommend that we continue on the current treatment. I recommended for him to proceed with cycle #11 today. I will see  him back for follow-up visit in 3 weeks for evaluation before starting cycle #12.  The patient was advised to call immediately if he has any concerning symptoms in the interval. The patient voices understanding of current disease status and treatment options and is in agreement with the current care plan. All questions were answered. The patient knows to call the clinic with any problems, questions or concerns. We can certainly see the patient much sooner if necessary.   No orders of the defined types were placed in this encounter.   All questions were answered. The patient knows to call the clinic with any problems, questions or concerns. We can certainly see the patient much sooner if necessary.  Mikey Bussing, NP 03/20/2017

## 2017-03-20 NOTE — Assessment & Plan Note (Signed)
This is a very pleasant 76 year old African-American male with metastatic non-small cell lung cancer, squamous cell carcinoma with significant liver metastasis. He is status post systemic chemotherapy was carboplatin and paclitaxel followed by observation followed by disease progression.  He is currently undergoing treatment with immunotherapy with Tecentriq [name]) status post 10 cycles. He tolerated the last cycle of his treatment well.  CT scan results were discussed with patient. I explained to him that his disease is stable. Recommend that we continue on the current treatment. I recommended for him to proceed with cycle #11 today. I will see him back for follow-up visit in 3 weeks for evaluation before starting cycle #12.  The patient was advised to call immediately if he has any concerning symptoms in the interval. The patient voices understanding of current disease status and treatment options and is in agreement with the current care plan. All questions were answered. The patient knows to call the clinic with any problems, questions or concerns. We can certainly see the patient much sooner if necessary.

## 2017-03-20 NOTE — Patient Instructions (Signed)
[facility] Discharge Instructions for Patients Receiving Chemotherapy  Today you received the following chemotherapy agents Tecentriq  To help prevent nausea and vomiting after your treatment, we encourage you to take your nausea medication as directed  If you develop nausea and vomiting that is not controlled by your nausea medication, call the clinic.   BELOW ARE SYMPTOMS THAT SHOULD BE REPORTED IMMEDIATELY:  *FEVER GREATER THAN 100.5 F  *CHILLS WITH OR WITHOUT FEVER  NAUSEA AND VOMITING THAT IS NOT CONTROLLED WITH YOUR NAUSEA MEDICATION  *UNUSUAL SHORTNESS OF BREATH  *UNUSUAL BRUISING OR BLEEDING  TENDERNESS IN MOUTH AND THROAT WITH OR WITHOUT PRESENCE OF ULCERS  *URINARY PROBLEMS  *BOWEL PROBLEMS  UNUSUAL RASH Items with * indicate a potential emergency and should be followed up as soon as possible.  Feel free to call the clinic you have any questions or concerns. The clinic phone number is [phone].  Please show the [facility] at check-in to the Emergency Department and triage nurse.

## 2017-03-21 ENCOUNTER — Telehealth: Payer: Self-pay | Admitting: Oncology

## 2017-03-21 NOTE — Telephone Encounter (Signed)
No 10/9 los. °

## 2017-03-21 NOTE — Telephone Encounter (Signed)
Patient states that the Rx for wheelchair needs to be faxed.

## 2017-03-30 ENCOUNTER — Telehealth: Payer: Self-pay | Admitting: Internal Medicine

## 2017-03-30 NOTE — Telephone Encounter (Signed)
Pt called stating that he needed some supplies sent in for him. I was not able to understand what he was asking for.Casey KitchenMarland Cooper

## 2017-03-30 NOTE — Telephone Encounter (Signed)
Patient needs tracheostomy supplies. Needs new RX to continue that supply. I was told we could fax an RX for tracheostomy supplies to [phone]. The place is called [place] [facility].

## 2017-04-03 ENCOUNTER — Telehealth: Payer: Self-pay | Admitting: Internal Medicine

## 2017-04-03 DIAGNOSIS — E785 Hyperlipidemia, unspecified: Secondary | ICD-10-CM

## 2017-04-03 DIAGNOSIS — C329 Malignant neoplasm of larynx, unspecified: Secondary | ICD-10-CM

## 2017-04-03 DIAGNOSIS — K769 Liver disease, unspecified: Secondary | ICD-10-CM

## 2017-04-03 DIAGNOSIS — I10 Essential (primary) hypertension: Secondary | ICD-10-CM

## 2017-04-03 MED ORDER — SIMVASTATIN 20 MG PO TABS: 20.0000 mg | ORAL_TABLET | Freq: Every evening | ORAL | 0 refills | Status: DC

## 2017-04-03 MED ORDER — FINASTERIDE 5 MG PO TABS: 5.0000 mg | ORAL_TABLET | Freq: Every morning | ORAL | 0 refills | Status: DC

## 2017-04-03 MED ORDER — ALLOPURINOL 100 MG PO TABS: 100.0000 mg | ORAL_TABLET | Freq: Every day | ORAL | 0 refills | Status: DC

## 2017-04-03 MED ORDER — TADALAFIL 5 MG PO TABS: 5.0000 mg | ORAL_TABLET | Freq: Every day | ORAL | 0 refills | Status: DC

## 2017-04-03 MED ORDER — AMLODIPINE BESYLATE 5 MG PO TABS: 5.0000 mg | ORAL_TABLET | Freq: Every day | ORAL | 0 refills | Status: DC

## 2017-04-03 NOTE — Telephone Encounter (Signed)
Given to [place]

## 2017-04-03 NOTE — Telephone Encounter (Signed)
Reviewed chart pt is up-to-date sent refills to to [facility] mail service 90 day supply...[name]

## 2017-04-03 NOTE — Telephone Encounter (Signed)
Faxed to fax number that was given

## 2017-04-03 NOTE — Telephone Encounter (Signed)
Pt called in and need refill to go to [facility] mail order  amlodpine  Allopurinol  Finasteride  Simvastatin tadalafil

## 2017-04-05 NOTE — Telephone Encounter (Signed)
Re faxed to same number 

## 2017-04-05 NOTE — Telephone Encounter (Signed)
Pt states the company received this, please refax [phone]  Phone Number [phone]

## 2017-04-10 ENCOUNTER — Ambulatory Visit (HOSPITAL_BASED_OUTPATIENT_CLINIC_OR_DEPARTMENT_OTHER): Payer: Medicare Other | Admitting: Internal Medicine

## 2017-04-10 ENCOUNTER — Encounter: Payer: Self-pay | Admitting: Internal Medicine

## 2017-04-10 ENCOUNTER — Ambulatory Visit (HOSPITAL_BASED_OUTPATIENT_CLINIC_OR_DEPARTMENT_OTHER): Payer: Medicare Other

## 2017-04-10 ENCOUNTER — Other Ambulatory Visit (HOSPITAL_BASED_OUTPATIENT_CLINIC_OR_DEPARTMENT_OTHER): Payer: Medicare Other

## 2017-04-10 VITALS — BP 129/74 | HR 88 | Temp 98.4°F | Resp 20 | Ht 66.0 in | Wt 140.8 lb

## 2017-04-10 DIAGNOSIS — Z8521 Personal history of malignant neoplasm of larynx: Secondary | ICD-10-CM | POA: Diagnosis not present

## 2017-04-10 DIAGNOSIS — C3411 Malignant neoplasm of upper lobe, right bronchus or lung: Secondary | ICD-10-CM

## 2017-04-10 DIAGNOSIS — Z923 Personal history of irradiation: Secondary | ICD-10-CM

## 2017-04-10 DIAGNOSIS — Z5112 Encounter for antineoplastic immunotherapy: Secondary | ICD-10-CM

## 2017-04-10 DIAGNOSIS — Z79899 Other long term (current) drug therapy: Secondary | ICD-10-CM

## 2017-04-10 DIAGNOSIS — C787 Secondary malignant neoplasm of liver and intrahepatic bile duct: Secondary | ICD-10-CM | POA: Diagnosis not present

## 2017-04-10 DIAGNOSIS — C329 Malignant neoplasm of larynx, unspecified: Secondary | ICD-10-CM

## 2017-04-10 LAB — COMPREHENSIVE METABOLIC PANEL
ALT: 13 U/L (ref 0–55)
ANION GAP: 9 meq/L (ref 3–11)
AST: 32 U/L (ref 5–34)
Albumin: 3.3 g/dL — ABNORMAL LOW (ref 3.5–5.0)
Alkaline Phosphatase: 152 U/L — ABNORMAL HIGH (ref 40–150)
BUN: 10.9 mg/dL (ref 7.0–26.0)
CHLORIDE: 107 meq/L (ref 98–109)
CO2: 24 mEq/L (ref 22–29)
Calcium: 9 mg/dL (ref 8.4–10.4)
Creatinine: 0.8 mg/dL (ref 0.7–1.3)
Glucose: 99 mg/dl (ref 70–140)
POTASSIUM: 4 meq/L (ref 3.5–5.1)
Sodium: 139 mEq/L (ref 136–145)
Total Bilirubin: 0.52 mg/dL (ref 0.20–1.20)
Total Protein: 7.1 g/dL (ref 6.4–8.3)

## 2017-04-10 LAB — CBC WITH DIFFERENTIAL/PLATELET
BASO%: 0.4 % (ref 0.0–2.0)
BASOS ABS: 0 10*3/uL (ref 0.0–0.1)
EOS ABS: 0.1 10*3/uL (ref 0.0–0.5)
EOS%: 1 % (ref 0.0–7.0)
HCT: 35.3 % — ABNORMAL LOW (ref 38.4–49.9)
HGB: 11.2 g/dL — ABNORMAL LOW (ref 13.0–17.1)
LYMPH%: 8.7 % — AB (ref 14.0–49.0)
MCH: 23.3 pg — AB (ref 27.2–33.4)
MCHC: 31.7 g/dL — ABNORMAL LOW (ref 32.0–36.0)
MCV: 73.5 fL — AB (ref 79.3–98.0)
MONO#: 0.5 10*3/uL (ref 0.1–0.9)
MONO%: 4.7 % (ref 0.0–14.0)
NEUT#: 9 10*3/uL — ABNORMAL HIGH (ref 1.5–6.5)
NEUT%: 85.2 % — AB (ref 39.0–75.0)
PLATELETS: 208 10*3/uL (ref 140–400)
RBC: 4.8 10*6/uL (ref 4.20–5.82)
RDW: 18.7 % — ABNORMAL HIGH (ref 11.0–14.6)
WBC: 10.5 10*3/uL — AB (ref 4.0–10.3)
lymph#: 0.9 10*3/uL (ref 0.9–3.3)

## 2017-04-10 LAB — TSH: TSH: 4.148 m(IU)/L — ABNORMAL HIGH (ref 0.320–4.118)

## 2017-04-10 MED ORDER — SODIUM CHLORIDE 0.9 % IV SOLN
Freq: Once | INTRAVENOUS | Status: AC
  Administered 2017-04-10: 13:00:00 via INTRAVENOUS

## 2017-04-10 MED ORDER — SODIUM CHLORIDE 0.9 % IV SOLN
1200.0000 mg | Freq: Once | INTRAVENOUS | Status: AC
  Administered 2017-04-10: 1200 mg via INTRAVENOUS
  Filled 2017-04-10: qty 20

## 2017-04-10 MED FILL — MONTELUKAST SOD 10 MG TAB: 10 | 90 days supply | Qty: 90 | Fill #1

## 2017-04-10 NOTE — Progress Notes (Signed)
[facility] [phone]   [phone]  OFFICE PROGRESS NOTE  [name], MD [place] [address]  PRINCIPAL DIAGNOSES:  1. 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 2.  metastatic non-small cell lung cancer initially diagnosed as Stage IA non-small-cell lung cancer diagnosed in June 2011.  PRIOR THERAPY:  1. Status post total laryngectomy with. Bilateral selective lymph node dissection as well as left superior parotidectomy with nerve dissection under the care of Dr. Redmond Baseman with positive resection margin. 2. Status post course of concurrent chemoradiation with weekly cisplatin. Last dose of chemotherapy was given January 08, 2007. 3. Status post curative radiotherapy with tomotherapy to the lung lesions completed December 31, 2009 under the care of Dr. Pablo Ledger. 4. Systemic chemotherapy with carboplatin for an AUC of 5 and paclitaxel 175 mg meter squared. Status post 6 cycles.   CURRENT THERAPY: Treatment with immunotherapy with Tecentriq (Atezolizumab) 1200 MG IV every 3 weeks. First dose 08/22/2016. Status post 11 cycles.  INTERVAL HISTORY: [name] 76 y.o. male returns to the clinic today for follow-up visit.  The patient is feeling fine today with no specific complaints.  He continues to tolerate his treatment with Tecentriq [name]) fairly well.  He denied having any skin rash or diarrhea.  He denied having any fever or chills.  He has no nausea, vomiting, diarrhea or constipation.  He denied having any chest pain, shortness of breath, cough or hemoptysis.  He is here today for evaluation before starting cycle #12.  MEDICAL HISTORY: Past Medical History:  Diagnosis Date  . Cancer ([facility]) 2009   SUPERGLOTTIS INVASIVE SQ CELL CA  . Deficiency anemia 08/15/2016  . Encounter for antineoplastic immunotherapy 08/15/2016  . Full code status 03/17/2015  . Goals of care,  counseling/discussion 08/15/2016  . Hypertension   . Stroke [facility]) 2002   left side weakness    ALLERGIES:  has No Known Allergies.  MEDICATIONS:  Current Outpatient Prescriptions  Medication Sig Dispense Refill  . albuterol (PROVENTIL) (2.5 MG/3ML) 0.083% nebulizer solution USE 1 VIAL (3 ML) BY NEBULIZATION EVERY 6 HOURS AS NEEDED FOR WHEEZING OR SHORTNESS OF BREATH 360 mL 0  . allopurinol (ZYLOPRIM) 100 MG tablet Take 1 tablet (100 mg total) by mouth daily. Annual appt due in Feb 2019 must see provider for future refills 90 tablet 0  . amLODipine (NORVASC) 5 MG tablet Take 1 tablet (5 mg total) by mouth at bedtime. Annual appt due in Feb 2019 must see provider for future refills 90 tablet 0  . aspirin 81 MG tablet Take 81 mg by mouth every morning.     . baclofen (LIORESAL) 10 MG tablet Take 1 tablet (10 mg total) by mouth at bedtime. 90 each 3  . cholecalciferol (VITAMIN D) 1000 UNITS tablet Take 1,000 Units by mouth every morning.     . colchicine 0.6 MG tablet Take 1 tablet (0.6 mg total) by mouth 2 (two) times daily. 60 tablet 11  . diphenhydrAMINE (BENADRYL) 25 MG tablet Take 50 mg by mouth every 8 (eight) hours as needed.    . finasteride (PROSCAR) 5 MG tablet Take 1 tablet (5 mg total) by mouth every morning. Annual appt due in Feb 2019 must see provider for future refills 90 tablet 0  . loratadine (CLARITIN) 10 MG tablet Take 10 mg by mouth daily.    . metoprolol succinate (TOPROL-XL) 25 MG 24 hr  tablet Take 1 tablet (25 mg total) by mouth every morning. 90 tablet 3  . montelukast (SINGULAIR) 10 MG tablet TAKE 1 TABLET BY MOUTH ONCE DAILY AT BEDTIME 90 tablet 1  . simvastatin (ZOCOR) 20 MG tablet Take 1 tablet (20 mg total) by mouth every evening. Annual appt due in Feb 2019 must see provider for future refills 90 tablet 0  . tadalafil (CIALIS) 5 MG tablet Take 1 tablet (5 mg total) by mouth daily. Annual appt due in Feb 2019 must see provider for future refills 90 tablet 0  .  tiZANidine (ZANAFLEX) 4 MG tablet Take 1 tablet (4 mg total) by mouth 3 (three) times daily as needed for muscle spasms. 90 tablet 1   No current facility-administered medications for this visit.     SURGICAL HISTORY:  Past Surgical History:  Procedure Laterality Date  . TRACHEOSTOMY  2009    REVIEW OF SYSTEMS:  A comprehensive review of systems was negative except for: Constitutional: positive for fatigue Musculoskeletal: positive for muscle weakness   PHYSICAL EXAMINATION: General appearance: alert, cooperative, fatigued and no distress Head: Normocephalic, without obvious abnormality, atraumatic Neck: no adenopathy, no JVD, supple, symmetrical, trachea midline and thyroid not enlarged, symmetric, no tenderness/mass/nodules Lymph nodes: Cervical, supraclavicular, and axillary nodes normal. Resp: clear to auscultation bilaterally Back: symmetric, no curvature. ROM normal. No CVA tenderness. Cardio: regular rate and rhythm, S1, S2 normal, no murmur, click, rub or gallop GI: soft, non-tender; bowel sounds normal; no masses,  no organomegaly Extremities: extremities normal, atraumatic, no cyanosis or edema  ECOG PERFORMANCE STATUS: 1 - Symptomatic but completely ambulatory  Blood pressure 129/74, pulse 88, temperature 98.4 F (36.9 C), temperature source Oral, resp. rate 20, height 5\' 6"  (1.676 m), weight 140 lb 12.8 oz (63.9 kg), SpO2 100 %.  LABORATORY DATA: Lab Results  Component Value Date   WBC 10.5 (H) 04/10/2017   HGB 11.2 (L) 04/10/2017   HCT 35.3 (L) 04/10/2017   MCV 73.5 (L) 04/10/2017   PLT 208 04/10/2017      Chemistry      Component Value Date/Time   NA 139 04/10/2017 1024   K 4.0 04/10/2017 1024   CL 103 08/02/2012 1107   CO2 24 04/10/2017 1024   BUN 10.9 04/10/2017 1024   CREATININE 0.8 04/10/2017 1024      Component Value Date/Time   CALCIUM 9.0 04/10/2017 1024   ALKPHOS 152 (H) 04/10/2017 1024   AST 32 04/10/2017 1024   ALT 13 04/10/2017 1024    BILITOT 0.52 04/10/2017 1024       RADIOGRAPHIC STUDIES: Ct Chest W Contrast  Result Date: 03/16/2017 CLINICAL DATA:  Supraglottic invasive squamous cell cancer status post laryngectomy in 2008. Metastatic non-small cell lung cancer diagnosis in 2011, restaging. EXAM: CT CHEST, ABDOMEN, AND PELVIS WITH CONTRAST TECHNIQUE: Multidetector CT imaging of the chest, abdomen and pelvis was performed following the standard protocol during bolus administration of intravenous contrast. CONTRAST:  100 cc Isovue 300 COMPARISON:  Multiple exams, including 12/27/2016 FINDINGS: CT CHEST FINDINGS Cardiovascular: Coronary, aortic arch, and branch vessel atherosclerotic vascular disease. Mediastinum/Nodes: Tracheostomy site noted. Gaseous distention of the upper esophagus. Small mediastinal lymph nodes are not pathologically enlarged. Lungs/Pleura: Stable biapical scarring. Paraseptal emphysema. Of nodularity along the minor fissure 2.7 by 2.3 cm, previously 2.5 by 2.1 cm, although measurements are not highly act. Given the orientation of this lesion. The most part the pulmonary nodules are stable in size compared to 12/27/2016. The right lower lobe pulmonary nodule  is minimally larger, currently measuring 8 by 7 mm and previously 7 by 7 mm. Considerable bilateral airway thickening is present. This is similar to prior. Musculoskeletal: Left rib deformities from old fractures. Prominent degenerative right sternoclavicular arthropathy with underlying subcortical sclerosis. Degenerative glenohumeral arthropathy bilaterally. Thoracic spondylosis. CT ABDOMEN PELVIS FINDINGS Hepatobiliary: Tumor infiltration of all segments of the liver roughly similar distribution to the prior exam. Index lesion inferiorly in the right hepatic lobe measures 5.0 by 3.4 cm on image 74/2, previously 5.1 by 3.0 cm. Gallbladder unremarkable.  Common bile duct within normal limits. Pancreas: Speckled calcifications in the pancreatic tail. Otherwise  unremarkable. Spleen: Unremarkable Adrenals/Urinary Tract: 2.2 by 1.7 cm cyst of the left kidney lower pole, similar to prior. Adrenal glands unremarkable. Stomach/Bowel: Unremarkable Vascular/Lymphatic: Infrarenal abdominal aortic aneurysm at the aortic bifurcation measures 4.4 by 4.2 cm, stable. Aortoiliac atherosclerotic vascular disease. Reproductive: Unremarkable Other: No supplemental non-categorized findings. Musculoskeletal: Lumbar spondylosis and degenerative disc disease. IMPRESSION: 1. Overall stable size in distribution of multiple hepatic masses and pulmonary nodules. No new nodules or significant progression. 2. Stable infrarenal abdominal aortic aneurysm, 4.4 cm. Recommend followup by ultrasound in 1 year. This recommendation follows ACR consensus guidelines: White Paper of the ACR Incidental Findings Committee II on Vascular Findings. J Am Coll Radiol 2013; 10:789-794. 3. Gaseous distention of the upper esophagus, possibly from dysmotility. 4. Aortic Atherosclerosis (ICD10-I70.0) and Emphysema (ICD10-J43.9). Coronary atherosclerosis. 5. Chronic considerable bilateral airway thickening. 6. Chronic degenerative right sternoclavicular arthropathy. 7. Speckled calcifications in the pancreatic tail, likely related to remote inflammation. Electronically Signed   By: Van Clines M.D.   On: 03/16/2017 16:19   Ct Abdomen Pelvis W Contrast  Result Date: 03/16/2017 CLINICAL DATA:  Supraglottic invasive squamous cell cancer status post laryngectomy in 2008. Metastatic non-small cell lung cancer diagnosis in 2011, restaging. EXAM: CT CHEST, ABDOMEN, AND PELVIS WITH CONTRAST TECHNIQUE: Multidetector CT imaging of the chest, abdomen and pelvis was performed following the standard protocol during bolus administration of intravenous contrast. CONTRAST:  100 cc Isovue 300 COMPARISON:  Multiple exams, including 12/27/2016 FINDINGS: CT CHEST FINDINGS Cardiovascular: Coronary, aortic arch, and branch vessel  atherosclerotic vascular disease. Mediastinum/Nodes: Tracheostomy site noted. Gaseous distention of the upper esophagus. Small mediastinal lymph nodes are not pathologically enlarged. Lungs/Pleura: Stable biapical scarring. Paraseptal emphysema. Of nodularity along the minor fissure 2.7 by 2.3 cm, previously 2.5 by 2.1 cm, although measurements are not highly act. Given the orientation of this lesion. The most part the pulmonary nodules are stable in size compared to 12/27/2016. The right lower lobe pulmonary nodule is minimally larger, currently measuring 8 by 7 mm and previously 7 by 7 mm. Considerable bilateral airway thickening is present. This is similar to prior. Musculoskeletal: Left rib deformities from old fractures. Prominent degenerative right sternoclavicular arthropathy with underlying subcortical sclerosis. Degenerative glenohumeral arthropathy bilaterally. Thoracic spondylosis. CT ABDOMEN PELVIS FINDINGS Hepatobiliary: Tumor infiltration of all segments of the liver roughly similar distribution to the prior exam. Index lesion inferiorly in the right hepatic lobe measures 5.0 by 3.4 cm on image 74/2, previously 5.1 by 3.0 cm. Gallbladder unremarkable.  Common bile duct within normal limits. Pancreas: Speckled calcifications in the pancreatic tail. Otherwise unremarkable. Spleen: Unremarkable Adrenals/Urinary Tract: 2.2 by 1.7 cm cyst of the left kidney lower pole, similar to prior. Adrenal glands unremarkable. Stomach/Bowel: Unremarkable Vascular/Lymphatic: Infrarenal abdominal aortic aneurysm at the aortic bifurcation measures 4.4 by 4.2 cm, stable. Aortoiliac atherosclerotic vascular disease. Reproductive: Unremarkable Other: No supplemental non-categorized findings. Musculoskeletal: Lumbar spondylosis and  degenerative disc disease. IMPRESSION: 1. Overall stable size in distribution of multiple hepatic masses and pulmonary nodules. No new nodules or significant progression. 2. Stable infrarenal  abdominal aortic aneurysm, 4.4 cm. Recommend followup by ultrasound in 1 year. This recommendation follows ACR consensus guidelines: White Paper of the ACR Incidental Findings Committee II on Vascular Findings. J Am Coll Radiol 2013; 10:789-794. 3. Gaseous distention of the upper esophagus, possibly from dysmotility. 4. Aortic Atherosclerosis (ICD10-I70.0) and Emphysema (ICD10-J43.9). Coronary atherosclerosis. 5. Chronic considerable bilateral airway thickening. 6. Chronic degenerative right sternoclavicular arthropathy. 7. Speckled calcifications in the pancreatic tail, likely related to remote inflammation. Electronically Signed   By: Van Clines M.D.   On: 03/16/2017 16:19    ASSESSMENT AND PLAN:   This is a very pleasant 76 years old African-American male with metastatic non-small cell lung cancer, squamous cell carcinoma with significant liver metastasis. He is status post systemic chemotherapy was carboplatin and paclitaxel followed by observation followed by disease progression. He is currently undergoing treatment with immunotherapy with Tecentriq [name]) status post 11 cycles. He continues to tolerate this treatment fairly well with no significant adverse effects. I recommended for him to proceed with cycle #12 today as a schedule. The patient will come back for follow-up visit in 3 weeks for evaluation before the next dose of his treatment. He was advised to call immediately if he has any concerning symptoms in the interval. The patient voices understanding of current disease status and treatment options and is in agreement with the current care plan. All questions were answered. The patient knows to call the clinic with any problems, questions or concerns. We can certainly see the patient much sooner if necessary. I spent 10 minutes counseling the patient face to face. The total time spent in the appointment was 15 minutes.  Disclaimer: This note was dictated with voice  recognition software. Similar sounding words can inadvertently be transcribed and may not be corrected upon review.

## 2017-04-10 NOTE — Patient Instructions (Signed)
[facility] Discharge Instructions for Patients Receiving Chemotherapy  Today you received the following chemotherapy agents: Tecintriq  To help prevent nausea and vomiting after your treatment, we encourage you to take your nausea medication as directed.    If you develop nausea and vomiting that is not controlled by your nausea medication, call the clinic.   BELOW ARE SYMPTOMS THAT SHOULD BE REPORTED IMMEDIATELY:  *FEVER GREATER THAN 100.5 F  *CHILLS WITH OR WITHOUT FEVER  NAUSEA AND VOMITING THAT IS NOT CONTROLLED WITH YOUR NAUSEA MEDICATION  *UNUSUAL SHORTNESS OF BREATH  *UNUSUAL BRUISING OR BLEEDING  TENDERNESS IN MOUTH AND THROAT WITH OR WITHOUT PRESENCE OF ULCERS  *URINARY PROBLEMS  *BOWEL PROBLEMS  UNUSUAL RASH Items with * indicate a potential emergency and should be followed up as soon as possible.  Feel free to call the clinic should you have any questions or concerns. The clinic phone number is [phone].  Please show the [facility] at check-in to the Emergency Department and triage nurse.

## 2017-04-11 ENCOUNTER — Telehealth: Payer: Self-pay | Admitting: Internal Medicine

## 2017-04-11 NOTE — Telephone Encounter (Signed)
Scheduled appt per 10/30 los - patient to get new schedule next visit.

## 2017-04-13 ENCOUNTER — Telehealth: Payer: Self-pay

## 2017-04-13 ENCOUNTER — Telehealth: Payer: Self-pay | Admitting: Internal Medicine

## 2017-04-13 NOTE — Telephone Encounter (Signed)
Tried to get a hold of patient but phone kept ringing and then disconnected. Trying to inform them that there is a form they need to fill out with what exact supplies and amount of supplies that are needed in order to fill the tracheostomy supplies prescription.

## 2017-04-13 NOTE — Telephone Encounter (Signed)
Pt's wife called stating that the pt's baclofen (LIORESAL) 10 MG tablet and metoprolol succinate (TOPROL-XL) 25 MG 24 hr tablet prescriptions should be sent to [facility]. I told her that they were sent to [facility] but she said they are not suppose to be sent there. Can these be resent. She said that this refill request has been going on for a while.

## 2017-04-16 ENCOUNTER — Other Ambulatory Visit: Payer: Self-pay

## 2017-04-16 DIAGNOSIS — R269 Unspecified abnormalities of gait and mobility: Secondary | ICD-10-CM

## 2017-04-16 DIAGNOSIS — C329 Malignant neoplasm of larynx, unspecified: Secondary | ICD-10-CM

## 2017-04-16 DIAGNOSIS — Z8673 Personal history of transient ischemic attack (TIA), and cerebral infarction without residual deficits: Secondary | ICD-10-CM

## 2017-04-16 DIAGNOSIS — K769 Liver disease, unspecified: Secondary | ICD-10-CM

## 2017-04-16 MED ORDER — METOPROLOL SUCCINATE ER 25 MG PO TB24: 25.0000 mg | ORAL_TABLET | Freq: Every morning | ORAL | 2 refills | Status: AC

## 2017-04-16 MED ORDER — BACLOFEN 10 MG PO TABS: 10.0000 mg | ORAL_TABLET | Freq: Every day | ORAL | 2 refills | Status: AC

## 2017-04-16 NOTE — Telephone Encounter (Signed)
Sent to [facility]

## 2017-05-01 ENCOUNTER — Telehealth: Payer: Self-pay | Admitting: Internal Medicine

## 2017-05-01 ENCOUNTER — Ambulatory Visit (HOSPITAL_BASED_OUTPATIENT_CLINIC_OR_DEPARTMENT_OTHER): Payer: Medicare Other

## 2017-05-01 ENCOUNTER — Encounter: Payer: Self-pay | Admitting: Internal Medicine

## 2017-05-01 ENCOUNTER — Other Ambulatory Visit (HOSPITAL_BASED_OUTPATIENT_CLINIC_OR_DEPARTMENT_OTHER): Payer: Medicare Other

## 2017-05-01 ENCOUNTER — Other Ambulatory Visit: Payer: Self-pay | Admitting: *Deleted

## 2017-05-01 ENCOUNTER — Ambulatory Visit (HOSPITAL_BASED_OUTPATIENT_CLINIC_OR_DEPARTMENT_OTHER): Payer: Medicare Other | Admitting: Internal Medicine

## 2017-05-01 VITALS — BP 119/86 | HR 85 | Temp 98.7°F | Resp 17 | Ht 66.0 in | Wt 140.8 lb

## 2017-05-01 DIAGNOSIS — C3411 Malignant neoplasm of upper lobe, right bronchus or lung: Secondary | ICD-10-CM

## 2017-05-01 DIAGNOSIS — Z5112 Encounter for antineoplastic immunotherapy: Secondary | ICD-10-CM

## 2017-05-01 DIAGNOSIS — C787 Secondary malignant neoplasm of liver and intrahepatic bile duct: Secondary | ICD-10-CM

## 2017-05-01 LAB — CBC WITH DIFFERENTIAL/PLATELET
BASO%: 0.7 % (ref 0.0–2.0)
BASOS ABS: 0.1 10*3/uL (ref 0.0–0.1)
EOS ABS: 0.2 10*3/uL (ref 0.0–0.5)
EOS%: 3 % (ref 0.0–7.0)
HCT: 36.3 % — ABNORMAL LOW (ref 38.4–49.9)
HGB: 11.4 g/dL — ABNORMAL LOW (ref 13.0–17.1)
LYMPH#: 1.1 10*3/uL (ref 0.9–3.3)
LYMPH%: 13.7 % — ABNORMAL LOW (ref 14.0–49.0)
MCH: 23.3 pg — AB (ref 27.2–33.4)
MCHC: 31.4 g/dL — AB (ref 32.0–36.0)
MCV: 74.1 fL — AB (ref 79.3–98.0)
MONO#: 0.4 10*3/uL (ref 0.1–0.9)
MONO%: 5.8 % (ref 0.0–14.0)
NEUT#: 5.9 10*3/uL (ref 1.5–6.5)
NEUT%: 76.8 % — AB (ref 39.0–75.0)
PLATELETS: 175 10*3/uL (ref 140–400)
RBC: 4.9 10*6/uL (ref 4.20–5.82)
RDW: 18.9 % — ABNORMAL HIGH (ref 11.0–14.6)
WBC: 7.6 10*3/uL (ref 4.0–10.3)

## 2017-05-01 LAB — COMPREHENSIVE METABOLIC PANEL
ALT: 13 U/L (ref 0–55)
ANION GAP: 9 meq/L (ref 3–11)
AST: 31 U/L (ref 5–34)
Albumin: 3.3 g/dL — ABNORMAL LOW (ref 3.5–5.0)
Alkaline Phosphatase: 171 U/L — ABNORMAL HIGH (ref 40–150)
BILIRUBIN TOTAL: 0.55 mg/dL (ref 0.20–1.20)
BUN: 11.9 mg/dL (ref 7.0–26.0)
CHLORIDE: 106 meq/L (ref 98–109)
CO2: 26 meq/L (ref 22–29)
Calcium: 9 mg/dL (ref 8.4–10.4)
Creatinine: 0.8 mg/dL (ref 0.7–1.3)
EGFR: >60 mL/min/{1.73_m2} (ref >60)
GLUCOSE: 91 mg/dL (ref 70–140)
POTASSIUM: 3.9 meq/L (ref 3.5–5.1)
Sodium: 141 mEq/L (ref 136–145)
Total Protein: 7.2 g/dL (ref 6.4–8.3)

## 2017-05-01 MED ORDER — SODIUM CHLORIDE 0.9 % IV SOLN
1200.0000 mg | Freq: Once | INTRAVENOUS | Status: AC
  Administered 2017-05-01: 1200 mg via INTRAVENOUS
  Filled 2017-05-01: qty 20

## 2017-05-01 MED ORDER — SODIUM CHLORIDE 0.9 % IV SOLN
Freq: Once | INTRAVENOUS | Status: AC
  Administered 2017-05-01: 12:00:00 via INTRAVENOUS

## 2017-05-01 NOTE — Telephone Encounter (Signed)
No additional appts added per 11/20 los - all appts already scheduled per treatment plan - gave patient calender.

## 2017-05-01 NOTE — Patient Instructions (Signed)
[facility] Discharge Instructions for Patients Receiving Chemotherapy  Today you received the following chemotherapy agents: Tecentriq  To help prevent nausea and vomiting after your treatment, we encourage you to take your nausea medication as directed.   If you develop nausea and vomiting that is not controlled by your nausea medication, call the clinic.   BELOW ARE SYMPTOMS THAT SHOULD BE REPORTED IMMEDIATELY:  *FEVER GREATER THAN 100.5 F  *CHILLS WITH OR WITHOUT FEVER  NAUSEA AND VOMITING THAT IS NOT CONTROLLED WITH YOUR NAUSEA MEDICATION  *UNUSUAL SHORTNESS OF BREATH  *UNUSUAL BRUISING OR BLEEDING  TENDERNESS IN MOUTH AND THROAT WITH OR WITHOUT PRESENCE OF ULCERS  *URINARY PROBLEMS  *BOWEL PROBLEMS  UNUSUAL RASH Items with * indicate a potential emergency and should be followed up as soon as possible.  Feel free to call the clinic should you have any questions or concerns. The clinic phone number is [phone].  Please show the CHEMO ALERT CARD at check-in to the Emergency Department and triage nurse.   

## 2017-05-01 NOTE — Progress Notes (Signed)
[facility] Telephone:([phone]) [phone]   Fax:(336) [phone]  OFFICE PROGRESS NOTE  Casey Koch, MD [place] [address]  PRINCIPAL DIAGNOSES:  1. 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 2.  metastatic non-small cell lung cancer initially diagnosed as Stage IA non-small-cell lung cancer diagnosed in June 2011.  PRIOR THERAPY:  1. Status post total laryngectomy with. Bilateral selective lymph node dissection as well as left superior parotidectomy with nerve dissection under the care of Dr. Redmond Baseman with positive resection margin. 2. Status post course of concurrent chemoradiation with weekly cisplatin. Last dose of chemotherapy was given January 08, 2007. 3. Status post curative radiotherapy with tomotherapy to the lung lesions completed December 31, 2009 under the care of Dr. Pablo Ledger. 4. Systemic chemotherapy with carboplatin for an AUC of 5 and paclitaxel 175 mg meter squared. Status post 6 cycles.   CURRENT THERAPY: Treatment with immunotherapy with Tecentriq (Atezolizumab) 1200 MG IV every 3 weeks. First dose 08/22/2016. Status post 12 cycles.  INTERVAL HISTORY: [name] 76 y.o. male returns to the clinic today for follow-up visit.  The patient continues to tolerate his current treatment with immunotherapy fairly well.  He denied having any chest pain, shortness of breath, cough or hemoptysis.  He denied having any weight loss or night sweats.  He has no nausea, vomiting, diarrhea or constipation.  The patient is here today for evaluation before starting cycle #13 of his treatment.  MEDICAL HISTORY: Past Medical History:  Diagnosis Date  . Cancer ([place]) 2009   SUPERGLOTTIS INVASIVE SQ CELL CA  . Deficiency anemia 08/15/2016  . Encounter for antineoplastic immunotherapy 08/15/2016  . Full code status 03/17/2015  . Goals of care, counseling/discussion 08/15/2016  . Hypertension   . Stroke [facility])  2002   left side weakness    ALLERGIES:  has No Known Allergies.  MEDICATIONS:  Current Outpatient Medications  Medication Sig Dispense Refill  . albuterol (PROVENTIL) (2.5 MG/3ML) 0.083% nebulizer solution USE 1 VIAL (3 ML) BY NEBULIZATION EVERY 6 HOURS AS NEEDED FOR WHEEZING OR SHORTNESS OF BREATH 360 mL 0  . allopurinol (ZYLOPRIM) 100 MG tablet Take 1 tablet (100 mg total) by mouth daily. Annual appt due in Feb 2019 must see provider for future refills 90 tablet 0  . amLODipine (NORVASC) 5 MG tablet Take 1 tablet (5 mg total) by mouth at bedtime. Annual appt due in Feb 2019 must see provider for future refills 90 tablet 0  . aspirin 81 MG tablet Take 81 mg by mouth every morning.     . baclofen (LIORESAL) 10 MG tablet Take 1 tablet (10 mg total) at bedtime by mouth. 90 each 2  . cholecalciferol (VITAMIN D) 1000 UNITS tablet Take 1,000 Units by mouth every morning.     . colchicine 0.6 MG tablet Take 1 tablet (0.6 mg total) by mouth 2 (two) times daily. 60 tablet 11  . diphenhydrAMINE (BENADRYL) 25 MG tablet Take 50 mg by mouth every 8 (eight) hours as needed.    . finasteride (PROSCAR) 5 MG tablet Take 1 tablet (5 mg total) by mouth every morning. Annual appt due in Feb 2019 must see provider for future refills 90 tablet 0  . loratadine (CLARITIN) 10 MG tablet Take 10 mg by mouth daily.    . metoprolol succinate (TOPROL-XL) 25 MG 24 hr tablet Take 1 tablet (25 mg total) every morning by mouth. 90 tablet  2  . montelukast (SINGULAIR) 10 MG tablet TAKE 1 TABLET BY MOUTH ONCE DAILY AT BEDTIME 90 tablet 1  . simvastatin (ZOCOR) 20 MG tablet Take 1 tablet (20 mg total) by mouth every evening. Annual appt due in Feb 2019 must see provider for future refills 90 tablet 0  . tadalafil (CIALIS) 5 MG tablet Take 1 tablet (5 mg total) by mouth daily. Annual appt due in Feb 2019 must see provider for future refills 90 tablet 0  . tiZANidine (ZANAFLEX) 4 MG tablet Take 1 tablet (4 mg total) by mouth 3  (three) times daily as needed for muscle spasms. 90 tablet 1   No current facility-administered medications for this visit.     SURGICAL HISTORY:  Past Surgical History:  Procedure Laterality Date  . TRACHEOSTOMY  2009    REVIEW OF SYSTEMS:  A comprehensive review of systems was negative except for: Constitutional: positive for fatigue Musculoskeletal: positive for muscle weakness   PHYSICAL EXAMINATION: General appearance: alert, cooperative and no distress Head: Normocephalic, without obvious abnormality, atraumatic Neck: no adenopathy, no JVD, supple, symmetrical, trachea midline and thyroid not enlarged, symmetric, no tenderness/mass/nodules Lymph nodes: Cervical, supraclavicular, and axillary nodes normal. Resp: clear to auscultation bilaterally Back: symmetric, no curvature. ROM normal. No CVA tenderness. Cardio: regular rate and rhythm, S1, S2 normal, no murmur, click, rub or gallop GI: soft, non-tender; bowel sounds normal; no masses,  no organomegaly Extremities: extremities normal, atraumatic, no cyanosis or edema  ECOG PERFORMANCE STATUS: 1 - Symptomatic but completely ambulatory  Blood pressure 119/86, pulse 85, temperature 98.7 F (37.1 C), temperature source Oral, resp. rate 17, height 5\' 6"  (1.676 m), weight 140 lb 12.8 oz (63.9 kg), SpO2 99 %.  LABORATORY DATA: Lab Results  Component Value Date   WBC 7.6 05/01/2017   HGB 11.4 (L) 05/01/2017   HCT 36.3 (L) 05/01/2017   MCV 74.1 (L) 05/01/2017   PLT 175 05/01/2017      Chemistry      Component Value Date/Time   NA 139 04/10/2017 1024   K 4.0 04/10/2017 1024   CL 103 08/02/2012 1107   CO2 24 04/10/2017 1024   BUN 10.9 04/10/2017 1024   CREATININE 0.8 04/10/2017 1024      Component Value Date/Time   CALCIUM 9.0 04/10/2017 1024   ALKPHOS 152 (H) 04/10/2017 1024   AST 32 04/10/2017 1024   ALT 13 04/10/2017 1024   BILITOT 0.52 04/10/2017 1024       RADIOGRAPHIC STUDIES: No results  found.  ASSESSMENT AND PLAN:   This is a very pleasant 76 years old African-American male with metastatic non-small cell lung cancer, squamous cell carcinoma with significant liver metastasis. He is status post systemic chemotherapy with carboplatin and paclitaxel followed by observation followed by disease progression. He is currently undergoing treatment with immunotherapy with Tecentriq [name]) status post 12 cycles. He continues to tolerate his treatment fairly well with no significant adverse effects. I recommended for the patient to proceed with cycle #13 today as a scheduled. He will come back for follow-up visit in 3 weeks for evaluation before the next cycle of his treatment. The patient voices understanding of current disease status and treatment options and is in agreement with the current care plan. All questions were answered. The patient knows to call the clinic with any problems, questions or concerns. We can certainly see the patient much sooner if necessary. I spent 10 minutes counseling the patient face to face. The total time spent in  the appointment was 15 minutes.  Disclaimer: This note was dictated with voice recognition software. Similar sounding words can inadvertently be transcribed and may not be corrected upon review.

## 2017-05-22 ENCOUNTER — Encounter: Payer: Self-pay | Admitting: *Deleted

## 2017-05-22 ENCOUNTER — Encounter: Payer: Self-pay | Admitting: Internal Medicine

## 2017-05-22 ENCOUNTER — Ambulatory Visit (HOSPITAL_BASED_OUTPATIENT_CLINIC_OR_DEPARTMENT_OTHER): Payer: Medicare Other

## 2017-05-22 ENCOUNTER — Ambulatory Visit (HOSPITAL_BASED_OUTPATIENT_CLINIC_OR_DEPARTMENT_OTHER): Payer: Medicare Other | Admitting: Internal Medicine

## 2017-05-22 ENCOUNTER — Other Ambulatory Visit (HOSPITAL_BASED_OUTPATIENT_CLINIC_OR_DEPARTMENT_OTHER): Payer: Medicare Other

## 2017-05-22 ENCOUNTER — Other Ambulatory Visit: Payer: Self-pay | Admitting: *Deleted

## 2017-05-22 VITALS — BP 135/70 | HR 83 | Temp 98.5°F | Resp 18 | Ht 68.0 in | Wt 147.4 lb

## 2017-05-22 DIAGNOSIS — C3411 Malignant neoplasm of upper lobe, right bronchus or lung: Secondary | ICD-10-CM

## 2017-05-22 DIAGNOSIS — C787 Secondary malignant neoplasm of liver and intrahepatic bile duct: Secondary | ICD-10-CM

## 2017-05-22 DIAGNOSIS — Z5112 Encounter for antineoplastic immunotherapy: Secondary | ICD-10-CM | POA: Diagnosis not present

## 2017-05-22 LAB — CBC WITH DIFFERENTIAL/PLATELET
BASO%: 0.6 % (ref 0.0–2.0)
Basophils Absolute: 0.1 10*3/uL (ref 0.0–0.1)
EOS%: 2.6 % (ref 0.0–7.0)
Eosinophils Absolute: 0.2 10*3/uL (ref 0.0–0.5)
HEMATOCRIT: 34.6 % — AB (ref 38.4–49.9)
HGB: 10.8 g/dL — ABNORMAL LOW (ref 13.0–17.1)
LYMPH%: 10.8 % — AB (ref 14.0–49.0)
MCH: 22.8 pg — AB (ref 27.2–33.4)
MCHC: 31.2 g/dL — AB (ref 32.0–36.0)
MCV: 73 fL — ABNORMAL LOW (ref 79.3–98.0)
MONO#: 0.6 10*3/uL (ref 0.1–0.9)
MONO%: 8 % (ref 0.0–14.0)
NEUT#: 6.1 10*3/uL (ref 1.5–6.5)
NEUT%: 78 % — AB (ref 39.0–75.0)
PLATELETS: 167 10*3/uL (ref 140–400)
RBC: 4.74 10*6/uL (ref 4.20–5.82)
RDW: 18.7 % — ABNORMAL HIGH (ref 11.0–14.6)
WBC: 7.8 10*3/uL (ref 4.0–10.3)
lymph#: 0.8 10*3/uL — ABNORMAL LOW (ref 0.9–3.3)
nRBC: 0 % (ref 0–0)

## 2017-05-22 LAB — COMPREHENSIVE METABOLIC PANEL
ALBUMIN: 3.4 g/dL — AB (ref 3.5–5.0)
ALK PHOS: 187 U/L — AB (ref 40–150)
ALT: 16 U/L (ref 0–55)
ANION GAP: 11 meq/L (ref 3–11)
AST: 35 U/L — ABNORMAL HIGH (ref 5–34)
BUN: 12.4 mg/dL (ref 7.0–26.0)
CO2: 23 mEq/L (ref 22–29)
CREATININE: 0.8 mg/dL (ref 0.7–1.3)
Calcium: 8.8 mg/dL (ref 8.4–10.4)
Chloride: 107 mEq/L (ref 98–109)
GLUCOSE: 102 mg/dL (ref 70–140)
Potassium: 3.8 mEq/L (ref 3.5–5.1)
Sodium: 140 mEq/L (ref 136–145)
TOTAL PROTEIN: 7.2 g/dL (ref 6.4–8.3)
Total Bilirubin: 0.55 mg/dL (ref 0.20–1.20)

## 2017-05-22 MED ORDER — ATEZOLIZUMAB CHEMO INJECTION 1200 MG/20ML
1200.0000 mg | Freq: Once | INTRAVENOUS | Status: AC
  Administered 2017-05-22: 1200 mg via INTRAVENOUS
  Filled 2017-05-22: qty 20

## 2017-05-22 MED ORDER — SODIUM CHLORIDE 0.9 % IV SOLN
Freq: Once | INTRAVENOUS | Status: AC
  Administered 2017-05-22: 12:00:00 via INTRAVENOUS

## 2017-05-22 NOTE — Patient Instructions (Signed)
[facility] Discharge Instructions for Patients Receiving Chemotherapy  Today you received the following chemotherapy agents: Tecentriq  To help prevent nausea and vomiting after your treatment, we encourage you to take your nausea medication as directed.   If you develop nausea and vomiting that is not controlled by your nausea medication, call the clinic.   BELOW ARE SYMPTOMS THAT SHOULD BE REPORTED IMMEDIATELY:  *FEVER GREATER THAN 100.5 F  *CHILLS WITH OR WITHOUT FEVER  NAUSEA AND VOMITING THAT IS NOT CONTROLLED WITH YOUR NAUSEA MEDICATION  *UNUSUAL SHORTNESS OF BREATH  *UNUSUAL BRUISING OR BLEEDING  TENDERNESS IN MOUTH AND THROAT WITH OR WITHOUT PRESENCE OF ULCERS  *URINARY PROBLEMS  *BOWEL PROBLEMS  UNUSUAL RASH Items with * indicate a potential emergency and should be followed up as soon as possible.  Feel free to call the clinic should you have any questions or concerns. The clinic phone number is [phone].  Please show the CHEMO ALERT CARD at check-in to the Emergency Department and triage nurse.   

## 2017-05-22 NOTE — Progress Notes (Signed)
[facility] [phone]   Fax:(336) [phone]  OFFICE PROGRESS NOTE  Casey Koch, MD [place] [address]  PRINCIPAL DIAGNOSES:  1. 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 2.  metastatic non-small cell lung cancer initially diagnosed as Stage IA non-small-cell lung cancer diagnosed in June 2011.  PRIOR THERAPY:  1. Status post total laryngectomy with. Bilateral selective lymph node dissection as well as left superior parotidectomy with nerve dissection under the care of Dr. Redmond Baseman with positive resection margin. 2. Status post course of concurrent chemoradiation with weekly cisplatin. Last dose of chemotherapy was given January 08, 2007. 3. Status post curative radiotherapy with tomotherapy to the lung lesions completed December 31, 2009 under the care of Dr. Pablo Ledger. 4. Systemic chemotherapy with carboplatin for an AUC of 5 and paclitaxel 175 mg meter squared. Status post 6 cycles.   CURRENT THERAPY: Treatment with immunotherapy with Tecentriq (Atezolizumab) 1200 MG IV every 3 weeks. First dose 08/22/2016. Status post 13 cycles.  INTERVAL HISTORY: [name] 76 y.o. male returns to the clinic today for follow-up visit.  The patient is feeling fine today with no specific complaints.  He denied having any chest pain, shortness breath, cough or hemoptysis.  He denied having any weight loss or night sweats.  He has no nausea, vomiting, diarrhea or constipation.  He continues to tolerate his treatment with Tecentriq [name]) fairly well.  He is here today for evaluation before starting cycle #14.  MEDICAL HISTORY: Past Medical History:  Diagnosis Date  . Cancer ([place]) 2009   SUPERGLOTTIS INVASIVE SQ CELL CA  . Deficiency anemia 08/15/2016  . Encounter for antineoplastic immunotherapy 08/15/2016  . Full code status 03/17/2015  . Goals of care, counseling/discussion 08/15/2016  .  Hypertension   . Stroke [facility]) 2002   left side weakness    ALLERGIES:  has No Known Allergies.  MEDICATIONS:  Current Outpatient Medications  Medication Sig Dispense Refill  . albuterol (PROVENTIL) (2.5 MG/3ML) 0.083% nebulizer solution USE 1 VIAL (3 ML) BY NEBULIZATION EVERY 6 HOURS AS NEEDED FOR WHEEZING OR SHORTNESS OF BREATH 360 mL 0  . allopurinol (ZYLOPRIM) 100 MG tablet Take 1 tablet (100 mg total) by mouth daily. Annual appt due in Feb 2019 must see provider for future refills 90 tablet 0  . amLODipine (NORVASC) 5 MG tablet Take 1 tablet (5 mg total) by mouth at bedtime. Annual appt due in Feb 2019 must see provider for future refills 90 tablet 0  . aspirin 81 MG tablet Take 81 mg by mouth every morning.     . baclofen (LIORESAL) 10 MG tablet Take 1 tablet (10 mg total) at bedtime by mouth. 90 each 2  . cholecalciferol (VITAMIN D) 1000 UNITS tablet Take 1,000 Units by mouth every morning.     . colchicine 0.6 MG tablet Take 1 tablet (0.6 mg total) by mouth 2 (two) times daily. 60 tablet 11  . diphenhydrAMINE (BENADRYL) 25 MG tablet Take 50 mg by mouth every 8 (eight) hours as needed.    . finasteride (PROSCAR) 5 MG tablet Take 1 tablet (5 mg total) by mouth every morning. Annual appt due in Feb 2019 must see provider for future refills 90 tablet 0  . loratadine (CLARITIN) 10 MG tablet Take 10 mg by mouth daily.    . metoprolol succinate (TOPROL-XL) 25 MG 24 hr tablet Take 1 tablet (25 mg total) every  morning by mouth. 90 tablet 2  . montelukast (SINGULAIR) 10 MG tablet TAKE 1 TABLET BY MOUTH ONCE DAILY AT BEDTIME 90 tablet 1  . simvastatin (ZOCOR) 20 MG tablet Take 1 tablet (20 mg total) by mouth every evening. Annual appt due in Feb 2019 must see provider for future refills 90 tablet 0  . tadalafil (CIALIS) 5 MG tablet Take 1 tablet (5 mg total) by mouth daily. Annual appt due in Feb 2019 must see provider for future refills 90 tablet 0  . tiZANidine (ZANAFLEX) 4 MG tablet Take 1  tablet (4 mg total) by mouth 3 (three) times daily as needed for muscle spasms. 90 tablet 1   No current facility-administered medications for this visit.     SURGICAL HISTORY:  Past Surgical History:  Procedure Laterality Date  . TRACHEOSTOMY  2009    REVIEW OF SYSTEMS:  A comprehensive review of systems was negative except for: Constitutional: positive for fatigue Musculoskeletal: positive for muscle weakness   PHYSICAL EXAMINATION: General appearance: alert, cooperative and no distress Head: Normocephalic, without obvious abnormality, atraumatic Neck: no adenopathy, no JVD, supple, symmetrical, trachea midline and thyroid not enlarged, symmetric, no tenderness/mass/nodules Lymph nodes: Cervical, supraclavicular, and axillary nodes normal. Resp: clear to auscultation bilaterally Back: symmetric, no curvature. ROM normal. No CVA tenderness. Cardio: regular rate and rhythm, S1, S2 normal, no murmur, click, rub or gallop GI: soft, non-tender; bowel sounds normal; no masses,  no organomegaly Extremities: extremities normal, atraumatic, no cyanosis or edema  ECOG PERFORMANCE STATUS: 1 - Symptomatic but completely ambulatory  Blood pressure 135/70, pulse 83, temperature 98.5 F (36.9 C), temperature source Oral, resp. rate 18, height 5\' 8"  (1.727 m), weight 147 lb 6.4 oz (66.9 kg), SpO2 100 %.  LABORATORY DATA: Lab Results  Component Value Date   WBC 7.8 05/22/2017   HGB 10.8 (L) 05/22/2017   HCT 34.6 (L) 05/22/2017   MCV 73.0 (L) 05/22/2017   PLT 167 05/22/2017      Chemistry      Component Value Date/Time   NA 141 05/01/2017 1030   K 3.9 05/01/2017 1030   CL 103 08/02/2012 1107   CO2 26 05/01/2017 1030   BUN 11.9 05/01/2017 1030   CREATININE 0.8 05/01/2017 1030      Component Value Date/Time   CALCIUM 9.0 05/01/2017 1030   ALKPHOS 171 (H) 05/01/2017 1030   AST 31 05/01/2017 1030   ALT 13 05/01/2017 1030   BILITOT 0.55 05/01/2017 1030       RADIOGRAPHIC  STUDIES: No results found.  ASSESSMENT AND PLAN:   This is a very pleasant 76 years old African-American male with metastatic non-small cell lung cancer, squamous cell carcinoma with significant liver metastasis. He is status post systemic chemotherapy with carboplatin and paclitaxel followed by observation followed by disease progression. He is currently undergoing treatment with immunotherapy with Tecentriq [name]) status post 13 cycles. He tolerated the last cycle of his treatment fairly well with no significant adverse effects. I recommended for the patient to proceed with cycle #14 today as a scheduled. He will come back for follow-up visit in 3 weeks for evaluation before starting cycle 15. He was advised to call immediately if he has any concerning symptoms in the interval. The patient voices understanding of current disease status and treatment options and is in agreement with the current care plan. All questions were answered. The patient knows to call the clinic with any problems, questions or concerns. We can certainly see the patient  much sooner if necessary. I spent 10 minutes counseling the patient face to face. The total time spent in the appointment was 15 minutes.  Disclaimer: This note was dictated with voice recognition software. Similar sounding words can inadvertently be transcribed and may not be corrected upon review.

## 2017-05-27 ENCOUNTER — Telehealth: Payer: Self-pay | Admitting: Internal Medicine

## 2017-05-27 NOTE — Telephone Encounter (Signed)
Added following appts per 12/11 los - sending confirmation letter with appts.

## 2017-06-13 ENCOUNTER — Inpatient Hospital Stay: Payer: Medicare Other | Attending: Oncology | Admitting: Oncology

## 2017-06-13 ENCOUNTER — Encounter: Payer: Self-pay | Admitting: Oncology

## 2017-06-13 ENCOUNTER — Ambulatory Visit (HOSPITAL_BASED_OUTPATIENT_CLINIC_OR_DEPARTMENT_OTHER): Payer: Medicare Other

## 2017-06-13 ENCOUNTER — Other Ambulatory Visit (HOSPITAL_BASED_OUTPATIENT_CLINIC_OR_DEPARTMENT_OTHER): Payer: Medicare Other

## 2017-06-13 VITALS — BP 150/67 | HR 95 | Temp 99.2°F | Resp 15 | Ht 68.0 in | Wt 148.8 lb

## 2017-06-13 DIAGNOSIS — Z5112 Encounter for antineoplastic immunotherapy: Secondary | ICD-10-CM

## 2017-06-13 DIAGNOSIS — Z923 Personal history of irradiation: Secondary | ICD-10-CM | POA: Diagnosis not present

## 2017-06-13 DIAGNOSIS — C3411 Malignant neoplasm of upper lobe, right bronchus or lung: Secondary | ICD-10-CM

## 2017-06-13 DIAGNOSIS — Z79899 Other long term (current) drug therapy: Secondary | ICD-10-CM | POA: Insufficient documentation

## 2017-06-13 DIAGNOSIS — Z8521 Personal history of malignant neoplasm of larynx: Secondary | ICD-10-CM | POA: Diagnosis not present

## 2017-06-13 DIAGNOSIS — D638 Anemia in other chronic diseases classified elsewhere: Secondary | ICD-10-CM | POA: Insufficient documentation

## 2017-06-13 DIAGNOSIS — C787 Secondary malignant neoplasm of liver and intrahepatic bile duct: Secondary | ICD-10-CM | POA: Diagnosis not present

## 2017-06-13 LAB — COMPREHENSIVE METABOLIC PANEL
ALT: 20 U/L (ref 0–55)
AST: 38 U/L — AB (ref 5–34)
Albumin: 3.4 g/dL — ABNORMAL LOW (ref 3.5–5.0)
Alkaline Phosphatase: 237 U/L — ABNORMAL HIGH (ref 40–150)
Anion Gap: 9 mEq/L (ref 3–11)
BUN: 14.9 mg/dL (ref 7.0–26.0)
CO2: 25 meq/L (ref 22–29)
Calcium: 9 mg/dL (ref 8.4–10.4)
Chloride: 104 mEq/L (ref 98–109)
Creatinine: 0.8 mg/dL (ref 0.7–1.3)
GLUCOSE: 126 mg/dL (ref 70–140)
POTASSIUM: 3.8 meq/L (ref 3.5–5.1)
Sodium: 138 mEq/L (ref 136–145)
Total Bilirubin: 0.45 mg/dL (ref 0.20–1.20)
Total Protein: 7.2 g/dL (ref 6.4–8.3)

## 2017-06-13 LAB — CBC WITH DIFFERENTIAL/PLATELET
BASO%: 0.7 % (ref 0.0–2.0)
Basophils Absolute: 0.1 10*3/uL (ref 0.0–0.1)
EOS%: 2.7 % (ref 0.0–7.0)
Eosinophils Absolute: 0.2 10*3/uL (ref 0.0–0.5)
HCT: 34.2 % — ABNORMAL LOW (ref 38.4–49.9)
HEMOGLOBIN: 10.6 g/dL — AB (ref 13.0–17.1)
LYMPH#: 0.8 10*3/uL — AB (ref 0.9–3.3)
LYMPH%: 10.1 % — ABNORMAL LOW (ref 14.0–49.0)
MCH: 22.3 pg — AB (ref 27.2–33.4)
MCHC: 31 g/dL — ABNORMAL LOW (ref 32.0–36.0)
MCV: 71.8 fL — AB (ref 79.3–98.0)
MONO#: 0.7 10*3/uL (ref 0.1–0.9)
MONO%: 7.8 % (ref 0.0–14.0)
NEUT%: 78.7 % — AB (ref 39.0–75.0)
NEUTROS ABS: 6.5 10*3/uL (ref 1.5–6.5)
Platelets: 208 10*3/uL (ref 140–400)
RBC: 4.76 10*6/uL (ref 4.20–5.82)
RDW: 20.2 % — ABNORMAL HIGH (ref 11.0–14.6)
WBC: 8.3 10*3/uL (ref 4.0–10.3)

## 2017-06-13 LAB — RESEARCH LABS

## 2017-06-13 MED ORDER — SODIUM CHLORIDE 0.9 % IV SOLN
Freq: Once | INTRAVENOUS | Status: AC
  Administered 2017-06-13: 16:00:00 via INTRAVENOUS

## 2017-06-13 MED ORDER — SODIUM CHLORIDE 0.9 % IV SOLN
1200.0000 mg | Freq: Once | INTRAVENOUS | Status: AC
  Administered 2017-06-13: 1200 mg via INTRAVENOUS
  Filled 2017-06-13: qty 20

## 2017-06-13 NOTE — Progress Notes (Signed)
[facility] OFFICE PROGRESS NOTE  Casey Koch, MD [place] [address]  DIAGNOSIS:  1. 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 2.  metastatic non-small cell lung cancer initially diagnosed as Stage IA non-small-cell lung cancer diagnosed in June 2011.  PRIOR THERAPY:  1. Status post total laryngectomy with. Bilateral selective lymph node dissection as well as left superior parotidectomy with nerve dissection under the care of Dr. Redmond Baseman with positive resection margin. 2. Status post course of concurrent chemoradiation with weekly cisplatin. Last dose of chemotherapy was given January 08, 2007. 3. Status post curative radiotherapy with tomotherapy to the lung lesions completed December 31, 2009 under the care of Dr. Pablo Ledger. 4. Systemic chemotherapy with carboplatin for an AUC of 5 and paclitaxel 175 mg meter squared. Status post 6 cycles.   INTERVAL HISTORY: [name] 77 y.o. male returns for routine follow-up visit by himself.  The patient is feeling fine today with no specific complaints.  He reports that he had recent cold symptoms which are now resolving.  He denies fevers and chills.  Denies chest pain, shortness breath, cough, hemoptysis.  Denies nausea, vomiting, constipation, diarrhea.  The patient continues to tolerate treatment over Tecentriq fairly well.  He is here for evaluation prior to cycle #15.  MEDICAL HISTORY: Past Medical History:  Diagnosis Date  . Cancer ([facility]) 2009   SUPERGLOTTIS INVASIVE SQ CELL CA  . Deficiency anemia 08/15/2016  . Encounter for antineoplastic immunotherapy 08/15/2016  . Full code status 03/17/2015  . Goals of care, counseling/discussion 08/15/2016  . Hypertension   . Stroke [facility]) 2002   left side weakness    ALLERGIES:  has No Known Allergies.  MEDICATIONS:  Current Outpatient Medications  Medication Sig Dispense Refill  . albuterol (PROVENTIL) (2.5  MG/3ML) 0.083% nebulizer solution USE 1 VIAL (3 ML) BY NEBULIZATION EVERY 6 HOURS AS NEEDED FOR WHEEZING OR SHORTNESS OF BREATH 360 mL 0  . allopurinol (ZYLOPRIM) 100 MG tablet Take 1 tablet (100 mg total) by mouth daily. Annual appt due in Feb 2019 must see provider for future refills 90 tablet 0  . amLODipine (NORVASC) 5 MG tablet Take 1 tablet (5 mg total) by mouth at bedtime. Annual appt due in Feb 2019 must see provider for future refills 90 tablet 0  . aspirin 81 MG tablet Take 81 mg by mouth every morning.     . baclofen (LIORESAL) 10 MG tablet Take 1 tablet (10 mg total) at bedtime by mouth. 90 each 2  . cholecalciferol (VITAMIN D) 1000 UNITS tablet Take 1,000 Units by mouth every morning.     . colchicine 0.6 MG tablet Take 1 tablet (0.6 mg total) by mouth 2 (two) times daily. 60 tablet 11  . diphenhydrAMINE (BENADRYL) 25 MG tablet Take 50 mg by mouth every 8 (eight) hours as needed.    . finasteride (PROSCAR) 5 MG tablet Take 1 tablet (5 mg total) by mouth every morning. Annual appt due in Feb 2019 must see provider for future refills 90 tablet 0  . loratadine (CLARITIN) 10 MG tablet Take 10 mg by mouth daily.    . metoprolol succinate (TOPROL-XL) 25 MG 24 hr tablet Take 1 tablet (25 mg total) every morning by mouth. 90 tablet 2  . montelukast (SINGULAIR) 10 MG tablet TAKE 1 TABLET BY MOUTH ONCE DAILY AT BEDTIME 90 tablet 1  . simvastatin (ZOCOR) 20 MG tablet Take 1 tablet (  20 mg total) by mouth every evening. Annual appt due in Feb 2019 must see provider for future refills 90 tablet 0  . tadalafil (CIALIS) 5 MG tablet Take 1 tablet (5 mg total) by mouth daily. Annual appt due in Feb 2019 must see provider for future refills 90 tablet 0  . tiZANidine (ZANAFLEX) 4 MG tablet Take 1 tablet (4 mg total) by mouth 3 (three) times daily as needed for muscle spasms. 90 tablet 1   No current facility-administered medications for this visit.    Facility-Administered Medications Ordered in Other  Visits  Medication Dose Route Frequency Provider Last Rate Last Dose  . atezolizumab (TECENTRIQ) 1,200 mg in sodium chloride 0.9 % 250 mL chemo infusion  1,200 mg Intravenous Once Casey Bears, MD 540 mL/hr at 06/13/17 1614 1,200 mg at 06/13/17 1614    SURGICAL HISTORY:  Past Surgical History:  Procedure Laterality Date  . TRACHEOSTOMY  2009    REVIEW OF SYSTEMS:   Review of Systems  Constitutional: Negative for appetite change, chills, fatigue, fever and unexpected weight change.  HENT:   Negative for mouth sores, nosebleeds, sore throat and trouble swallowing.   Eyes: Negative for eye problems and icterus.  Respiratory: Negative for cough, hemoptysis, shortness of breath and wheezing.   Cardiovascular: Negative for chest pain and leg swelling.  Gastrointestinal: Negative for abdominal pain, constipation, diarrhea, nausea and vomiting.  Genitourinary: Negative for bladder incontinence, difficulty urinating, dysuria, frequency and hematuria.   Musculoskeletal: Negative for back pain, gait problem, neck pain and neck stiffness.  Skin: Negative for itching and rash.  Neurological: Negative for dizziness, extremity weakness, gait problem, headaches, light-headedness and seizures.  Hematological: Negative for adenopathy. Does not bruise/bleed easily.  Psychiatric/Behavioral: Negative for confusion, depression and sleep disturbance. The patient is not nervous/anxious.     PHYSICAL EXAMINATION:  Blood pressure (!) 150/67, pulse 95, temperature 99.2 F (37.3 C), temperature source Oral, resp. rate 15, height 5\' 8"  (1.727 m), weight 148 lb 12.8 oz (67.5 kg), SpO2 99 %.  ECOG PERFORMANCE STATUS: 1 - Symptomatic but completely ambulatory  Physical Exam  Constitutional: Oriented to person, place, and time and well-developed, well-nourished, and in no distress. No distress.  HENT:  Head: Normocephalic and atraumatic.  Mouth/Throat: Oropharynx is clear and moist. No oropharyngeal exudate.   Eyes: Conjunctivae are normal. Right eye exhibits no discharge. Left eye exhibits no discharge. No scleral icterus.  Neck: Normal range of motion. Neck supple.  Cardiovascular: Normal rate, regular rhythm, normal heart sounds and intact distal pulses.   Pulmonary/Chest: Effort normal and breath sounds normal. No respiratory distress. No wheezes. No rales.  Abdominal: Soft. Bowel sounds are normal. Exhibits no distension and no mass. There is no tenderness.  Musculoskeletal: Normal range of motion. Exhibits no edema.  Lymphadenopathy:    No cervical adenopathy.  Neurological: Alert and oriented to person, place, and time. Exhibits normal muscle tone. Gait normal. Coordination normal.  Skin: Skin is warm and dry. No rash noted. Not diaphoretic. No erythema. No pallor.  Psychiatric: Mood, memory and judgment normal.  Vitals reviewed.  LABORATORY DATA: Lab Results  Component Value Date   WBC 8.3 06/13/2017   HGB 10.6 (L) 06/13/2017   HCT 34.2 (L) 06/13/2017   MCV 71.8 (L) 06/13/2017   PLT 208 06/13/2017      Chemistry      Component Value Date/Time   NA 138 06/13/2017 1343   K 3.8 06/13/2017 1343   CL 103 08/02/2012 1107   CO2  25 06/13/2017 1343   BUN 14.9 06/13/2017 1343   CREATININE 0.8 06/13/2017 1343      Component Value Date/Time   CALCIUM 9.0 06/13/2017 1343   ALKPHOS 237 (H) 06/13/2017 1343   AST 38 (H) 06/13/2017 1343   ALT 20 06/13/2017 1343   BILITOT 0.45 06/13/2017 1343       RADIOGRAPHIC STUDIES:  No results found.   ASSESSMENT/PLAN:  Malignant neoplasm of upper lobe of right lung [facility]) This is a very pleasant 77 year old African-American male with metastatic non-small cell lung cancer, squamous cell carcinoma with significant liver metastasis. He is status post systemic chemotherapy with carboplatin and paclitaxel followed by observation followed by disease progression. He is currently undergoing treatment with immunotherapy with Tecentriq [name])  status post 14 cycles. He tolerated the last cycle of his treatment fairly well with no significant adverse effects. I recommended for the patient to proceed with cycle #15 today as a scheduled.  The patient will have a restaging CT scan of the chest, abdomen, pelvis prior to his next visit. He will come back for follow-up visit in 3 weeks for evaluation before starting cycle 16 and to review his restaging CT scan results.  He was advised to call immediately if he has any concerning symptoms in the interval. The patient voices understanding of current disease status and treatment options and is in agreement with the current care plan. All questions were answered. The patient knows to call the clinic with any problems, questions or concerns. We can certainly see the patient much sooner if necessary.  Orders Placed This Encounter  Procedures  . CT CHEST W CONTRAST    Standing Status:   Future    Standing Expiration Date:   06/13/2018    Order Specific Question:   If indicated for the ordered procedure, I authorize the administration of contrast media per Radiology protocol    Answer:   Yes    Order Specific Question:   Preferred imaging location?    Answer:   [facility]    Order Specific Question:   Radiology Contrast Protocol - do NOT remove file path    Answer:   [email or web address]    Order Specific Question:   Reason for Exam additional comments    Answer:   Lung cancer with liver metastases.  Restaging.  Marland Kitchen CT ABDOMEN PELVIS W CONTRAST    Standing Status:   Future    Standing Expiration Date:   06/13/2018    Order Specific Question:   If indicated for the ordered procedure, I authorize the administration of contrast media per Radiology protocol    Answer:   Yes    Order Specific Question:   Preferred imaging location?    Answer:   [facility]    Order Specific Question:   Radiology Contrast Protocol - do NOT remove file path    Answer:    [email or web address]    Order Specific Question:   Reason for Exam additional comments    Answer:   Lung cancer with liver metastases.  Restaging.  Marland Kitchen CBC with Differential/Platelet    Standing Status:   Standing    Number of Occurrences:   10    Standing Expiration Date:   06/13/2018  . Comprehensive metabolic panel    Standing Status:   Standing    Number of Occurrences:   10    Standing Expiration Date:   06/13/2018  . TSH    Standing Status:   Standing  Number of Occurrences:   10    Standing Expiration Date:   06/13/2018     Mikey Bussing, DNP, AGPCNP-BC, AOCNP 06/13/17

## 2017-06-13 NOTE — Patient Instructions (Signed)
[facility] Discharge Instructions for Patients Receiving Chemotherapy  Today you received the following chemotherapy agent: Tecentriq   To help prevent nausea and vomiting after your treatment, we encourage you to take your nausea medication as directed.   If you develop nausea and vomiting that is not controlled by your nausea medication, call the clinic.   BELOW ARE SYMPTOMS THAT SHOULD BE REPORTED IMMEDIATELY:  *FEVER GREATER THAN 100.5 F  *CHILLS WITH OR WITHOUT FEVER  NAUSEA AND VOMITING THAT IS NOT CONTROLLED WITH YOUR NAUSEA MEDICATION  *UNUSUAL SHORTNESS OF BREATH  *UNUSUAL BRUISING OR BLEEDING  TENDERNESS IN MOUTH AND THROAT WITH OR WITHOUT PRESENCE OF ULCERS  *URINARY PROBLEMS  *BOWEL PROBLEMS  UNUSUAL RASH Items with * indicate a potential emergency and should be followed up as soon as possible.  Feel free to call the clinic should you have any questions or concerns. The clinic phone number is [phone].  Please show the [facility] at check-in to the Emergency Department and triage nurse.

## 2017-06-13 NOTE — Assessment & Plan Note (Signed)
This is a very pleasant 77 year old African-American male with metastatic non-small cell lung cancer, squamous cell carcinoma with significant liver metastasis. He is status post systemic chemotherapy with carboplatin and paclitaxel followed by observation followed by disease progression. He is currently undergoing treatment with immunotherapy with Tecentriq [name]) status post 14 cycles. He tolerated the last cycle of his treatment fairly well with no significant adverse effects. I recommended for the patient to proceed with cycle #15 today as a scheduled.  The patient will have a restaging CT scan of the chest, abdomen, pelvis prior to his next visit. He will come back for follow-up visit in 3 weeks for evaluation before starting cycle 16 and to review his restaging CT scan results.  He was advised to call immediately if he has any concerning symptoms in the interval. The patient voices understanding of current disease status and treatment options and is in agreement with the current care plan. All questions were answered. The patient knows to call the clinic with any problems, questions or concerns. We can certainly see the patient much sooner if necessary.

## 2017-06-14 LAB — TSH: TSH: 4.358 m[IU]/L — AB (ref 0.320–4.118)

## 2017-06-19 ENCOUNTER — Telehealth: Payer: Self-pay | Admitting: Internal Medicine

## 2017-06-19 NOTE — Telephone Encounter (Signed)
Spoke to patient regarding upcoming January through March appointments.

## 2017-06-27 ENCOUNTER — Other Ambulatory Visit: Payer: Self-pay

## 2017-06-27 DIAGNOSIS — I10 Essential (primary) hypertension: Secondary | ICD-10-CM

## 2017-06-27 DIAGNOSIS — C329 Malignant neoplasm of larynx, unspecified: Secondary | ICD-10-CM

## 2017-06-27 MED ORDER — AMLODIPINE BESYLATE 5 MG PO TABS: 5.0000 mg | ORAL_TABLET | Freq: Every day | ORAL | 0 refills | Status: AC

## 2017-06-29 ENCOUNTER — Ambulatory Visit (HOSPITAL_COMMUNITY)
Admission: RE | Admit: 2017-06-29 | Discharge: 2017-06-29 | Disposition: A | Discharge status: Home / Self Care | Payer: Medicare Other | Source: Ambulatory Visit | Attending: Oncology | Admitting: Oncology

## 2017-06-29 DIAGNOSIS — C7800 Secondary malignant neoplasm of unspecified lung: Secondary | ICD-10-CM | POA: Diagnosis not present

## 2017-06-29 DIAGNOSIS — K869 Disease of pancreas, unspecified: Secondary | ICD-10-CM | POA: Diagnosis not present

## 2017-06-29 DIAGNOSIS — I714 Abdominal aortic aneurysm, without rupture: Secondary | ICD-10-CM | POA: Insufficient documentation

## 2017-06-29 DIAGNOSIS — C3411 Malignant neoplasm of upper lobe, right bronchus or lung: Secondary | ICD-10-CM | POA: Diagnosis present

## 2017-06-29 DIAGNOSIS — J439 Emphysema, unspecified: Secondary | ICD-10-CM | POA: Diagnosis not present

## 2017-06-29 DIAGNOSIS — C787 Secondary malignant neoplasm of liver and intrahepatic bile duct: Secondary | ICD-10-CM | POA: Diagnosis present

## 2017-06-29 MED ORDER — IOPAMIDOL (ISOVUE-300) INJECTION 61%
100.0000 mL | Freq: Once | INTRAVENOUS | Status: AC | PRN
  Administered 2017-06-29: 100 mL via INTRAVENOUS

## 2017-06-29 MED ORDER — IOPAMIDOL (ISOVUE-370) INJECTION 76%
INTRAVENOUS | Status: AC
  Filled 2017-06-29: qty 100

## 2017-07-03 ENCOUNTER — Inpatient Hospital Stay: Payer: Medicare Other

## 2017-07-03 ENCOUNTER — Inpatient Hospital Stay (HOSPITAL_BASED_OUTPATIENT_CLINIC_OR_DEPARTMENT_OTHER): Payer: Medicare Other | Admitting: Internal Medicine

## 2017-07-03 ENCOUNTER — Encounter: Payer: Self-pay | Admitting: Internal Medicine

## 2017-07-03 ENCOUNTER — Other Ambulatory Visit: Payer: Self-pay | Admitting: *Deleted

## 2017-07-03 VITALS — BP 131/83 | HR 97 | Temp 98.4°F | Resp 18 | Ht 68.0 in | Wt 149.0 lb

## 2017-07-03 DIAGNOSIS — C787 Secondary malignant neoplasm of liver and intrahepatic bile duct: Secondary | ICD-10-CM | POA: Diagnosis not present

## 2017-07-03 DIAGNOSIS — D638 Anemia in other chronic diseases classified elsewhere: Secondary | ICD-10-CM

## 2017-07-03 DIAGNOSIS — C3411 Malignant neoplasm of upper lobe, right bronchus or lung: Secondary | ICD-10-CM | POA: Diagnosis not present

## 2017-07-03 DIAGNOSIS — Z5112 Encounter for antineoplastic immunotherapy: Secondary | ICD-10-CM

## 2017-07-03 DIAGNOSIS — Z79899 Other long term (current) drug therapy: Secondary | ICD-10-CM | POA: Diagnosis not present

## 2017-07-03 LAB — TSH: TSH: 2.879 u[IU]/mL (ref 0.320–4.118)

## 2017-07-03 LAB — CBC WITH DIFFERENTIAL/PLATELET
BASOS ABS: 0 10*3/uL (ref 0.0–0.1)
Basophils Relative: 0 %
EOS PCT: 1 %
Eosinophils Absolute: 0.1 10*3/uL (ref 0.0–0.5)
HEMATOCRIT: 31.7 % — AB (ref 38.4–49.9)
Hemoglobin: 9.9 g/dL — ABNORMAL LOW (ref 13.0–17.1)
Lymphocytes Relative: 6 %
Lymphs Abs: 0.6 10*3/uL — ABNORMAL LOW (ref 0.9–3.3)
MCH: 22.4 pg — ABNORMAL LOW (ref 27.2–33.4)
MCHC: 31.2 g/dL — AB (ref 32.0–36.0)
MCV: 71.9 fL — AB (ref 79.3–98.0)
MONO ABS: 0.7 10*3/uL (ref 0.1–0.9)
MONOS PCT: 7 %
Neutro Abs: 8.1 10*3/uL — ABNORMAL HIGH (ref 1.5–6.5)
Neutrophils Relative %: 86 %
PLATELETS: 185 10*3/uL (ref 140–400)
RBC: 4.41 MIL/uL (ref 4.20–5.82)
RDW: 18.7 % — AB (ref 11.0–15.6)
WBC: 9.4 10*3/uL (ref 4.0–10.3)

## 2017-07-03 LAB — COMPREHENSIVE METABOLIC PANEL
ALBUMIN: 3.3 g/dL — AB (ref 3.5–5.0)
ALK PHOS: 262 U/L — AB (ref 40–150)
ALT: 24 U/L (ref 0–55)
AST: 51 U/L — AB (ref 5–34)
Anion gap: 9 (ref 3–11)
BUN: 15 mg/dL (ref 7–26)
CALCIUM: 8.9 mg/dL (ref 8.4–10.4)
CO2: 25 mmol/L (ref 22–29)
Chloride: 106 mmol/L (ref 98–109)
Creatinine, Ser: 0.83 mg/dL (ref 0.70–1.30)
GFR calc Af Amer: >60 mL/min (ref >60)
GFR calc non Af Amer: >60 mL/min (ref >60)
GLUCOSE: 98 mg/dL (ref 70–140)
Potassium: 4 mmol/L (ref 3.5–5.1)
SODIUM: 140 mmol/L (ref 136–145)
Total Bilirubin: 0.6 mg/dL (ref 0.2–1.2)
Total Protein: 7.2 g/dL (ref 6.4–8.3)

## 2017-07-03 MED ORDER — ALLOPURINOL 100 MG PO TABS: 100.0000 mg | ORAL_TABLET | Freq: Every day | ORAL | 0 refills | Status: AC

## 2017-07-03 MED ORDER — SODIUM CHLORIDE 0.9 % IV SOLN
1200.0000 mg | Freq: Once | INTRAVENOUS | Status: AC
  Administered 2017-07-03: 1200 mg via INTRAVENOUS
  Filled 2017-07-03: qty 20

## 2017-07-03 MED ORDER — SODIUM CHLORIDE 0.9 % IV SOLN
Freq: Once | INTRAVENOUS | Status: AC
  Administered 2017-07-03: 12:00:00 via INTRAVENOUS

## 2017-07-03 NOTE — Progress Notes (Signed)
[facility] [phone]   [phone]  OFFICE PROGRESS NOTE  [name], MD [place] [address]  PRINCIPAL DIAGNOSES:  1. 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 2.  metastatic non-small cell lung cancer initially diagnosed as Stage IA non-small-cell lung cancer diagnosed in June 2011.  PRIOR THERAPY:  1. Status post total laryngectomy with. Bilateral selective lymph node dissection as well as left superior parotidectomy with nerve dissection under the care of Dr. Redmond Baseman with positive resection margin. 2. Status post course of concurrent chemoradiation with weekly cisplatin. Last dose of chemotherapy was given January 08, 2007. 3. Status post curative radiotherapy with tomotherapy to the lung lesions completed December 31, 2009 under the care of Dr. Pablo Ledger. 4. Systemic chemotherapy with carboplatin for an AUC of 5 and paclitaxel 175 mg meter squared. Status post 6 cycles.   CURRENT THERAPY: Treatment with immunotherapy with Tecentriq (Atezolizumab) 1200 MG IV every 3 weeks. First dose 08/22/2016. Status post 15 cycles.  INTERVAL HISTORY: [name] 77 y.o. male returns to the clinic today for follow-up visit.  The patient is feeling fine today with no specific complaints.  He denied having any chest pain, shortness of breath, cough or hemoptysis.  He denied having any nausea, vomiting, diarrhea or constipation.  He denied having any weight loss or night sweats.  The patient continues to tolerate his treatment with immunotherapy with Tecentriq [name]) fairly well.  He had repeat CT scan of the chest, abdomen and pelvis performed recently and he is here for evaluation and discussion of his discuss results.  MEDICAL HISTORY: Past Medical History:  Diagnosis Date  . Cancer ([place]) 2009   SUPERGLOTTIS INVASIVE SQ CELL CA  . Deficiency anemia 08/15/2016  . Encounter for  antineoplastic immunotherapy 08/15/2016  . Full code status 03/17/2015  . Goals of care, counseling/discussion 08/15/2016  . Hypertension   . Stroke [facility]) 2002   left side weakness    ALLERGIES:  has No Known Allergies.  MEDICATIONS:  Current Outpatient Medications  Medication Sig Dispense Refill  . albuterol (PROVENTIL) (2.5 MG/3ML) 0.083% nebulizer solution USE 1 VIAL (3 ML) BY NEBULIZATION EVERY 6 HOURS AS NEEDED FOR WHEEZING OR SHORTNESS OF BREATH 360 mL 0  . allopurinol (ZYLOPRIM) 100 MG tablet Take 1 tablet (100 mg total) by mouth daily. Annual appt due in Feb 2019 must see provider for future refills 90 tablet 0  . amLODipine (NORVASC) 5 MG tablet Take 1 tablet (5 mg total) by mouth at bedtime. Annual appt due in Feb 2019 must see provider for future refills 90 tablet 0  . aspirin 81 MG tablet Take 81 mg by mouth every morning.     . baclofen (LIORESAL) 10 MG tablet Take 1 tablet (10 mg total) at bedtime by mouth. 90 each 2  . cholecalciferol (VITAMIN D) 1000 UNITS tablet Take 1,000 Units by mouth every morning.     . colchicine 0.6 MG tablet Take 1 tablet (0.6 mg total) by mouth 2 (two) times daily. 60 tablet 11  . diphenhydrAMINE (BENADRYL) 25 MG tablet Take 50 mg by mouth every 8 (eight) hours as needed.    . finasteride (PROSCAR) 5 MG tablet Take 1 tablet (5 mg total) by mouth every morning. Annual appt due in Feb 2019 must see provider for future refills 90 tablet 0  . loratadine (CLARITIN) 10 MG tablet Take 10 mg by mouth  daily.    . metoprolol succinate (TOPROL-XL) 25 MG 24 hr tablet Take 1 tablet (25 mg total) every morning by mouth. 90 tablet 2  . montelukast (SINGULAIR) 10 MG tablet TAKE 1 TABLET BY MOUTH ONCE DAILY AT BEDTIME 90 tablet 1  . simvastatin (ZOCOR) 20 MG tablet Take 1 tablet (20 mg total) by mouth every evening. Annual appt due in Feb 2019 must see provider for future refills 90 tablet 0  . tadalafil (CIALIS) 5 MG tablet Take 1 tablet (5 mg total) by mouth daily.  Annual appt due in Feb 2019 must see provider for future refills 90 tablet 0  . tiZANidine (ZANAFLEX) 4 MG tablet Take 1 tablet (4 mg total) by mouth 3 (three) times daily as needed for muscle spasms. 90 tablet 1   No current facility-administered medications for this visit.     SURGICAL HISTORY:  Past Surgical History:  Procedure Laterality Date  . TRACHEOSTOMY  2009    REVIEW OF SYSTEMS:  Constitutional: negative Eyes: negative Ears, nose, mouth, throat, and face: negative Respiratory: negative Cardiovascular: negative Gastrointestinal: negative Genitourinary:negative Integument/breast: negative Hematologic/lymphatic: negative Musculoskeletal:positive for muscle weakness Neurological: negative Behavioral/Psych: negative Endocrine: negative Allergic/Immunologic: negative   PHYSICAL EXAMINATION: General appearance: alert, cooperative and no distress Head: Normocephalic, without obvious abnormality, atraumatic Neck: no adenopathy, no JVD, supple, symmetrical, trachea midline and thyroid not enlarged, symmetric, no tenderness/mass/nodules Lymph nodes: Cervical, supraclavicular, and axillary nodes normal. Resp: clear to auscultation bilaterally Back: symmetric, no curvature. ROM normal. No CVA tenderness. Cardio: regular rate and rhythm, S1, S2 normal, no murmur, click, rub or gallop GI: soft, non-tender; bowel sounds normal; no masses,  no organomegaly Extremities: extremities normal, atraumatic, no cyanosis or edema Neurologic: Alert and oriented X 3, normal strength and tone. Normal symmetric reflexes. Normal coordination and gait  ECOG PERFORMANCE STATUS: 1 - Symptomatic but completely ambulatory  Blood pressure 131/83, pulse 97, temperature 98.4 F (36.9 C), temperature source Oral, resp. rate 18, height 5\' 8"  (1.727 m), weight 149 lb (67.6 kg), SpO2 100 %.  LABORATORY DATA: Lab Results  Component Value Date   WBC 9.4 07/03/2017   HGB 9.9 (L) 07/03/2017   HCT 31.7  (L) 07/03/2017   MCV 71.9 (L) 07/03/2017   PLT 185 07/03/2017      Chemistry      Component Value Date/Time   NA 138 06/13/2017 1343   K 3.8 06/13/2017 1343   CL 103 08/02/2012 1107   CO2 25 06/13/2017 1343   BUN 14.9 06/13/2017 1343   CREATININE 0.8 06/13/2017 1343      Component Value Date/Time   CALCIUM 9.0 06/13/2017 1343   ALKPHOS 237 (H) 06/13/2017 1343   AST 38 (H) 06/13/2017 1343   ALT 20 06/13/2017 1343   BILITOT 0.45 06/13/2017 1343       RADIOGRAPHIC STUDIES: Ct Chest W Contrast  Result Date: 06/29/2017 CLINICAL DATA:  Restaging metastatic head and neck cancer EXAM: CT CHEST, ABDOMEN, AND PELVIS WITH CONTRAST TECHNIQUE: Multidetector CT imaging of the chest, abdomen and pelvis was performed following the standard protocol during bolus administration of intravenous contrast. CONTRAST:  115mL ISOVUE-300 IOPAMIDOL (ISOVUE-300) INJECTION 61% COMPARISON:  None. FINDINGS: Cardiovascular: The heart is normal in size. No pericardial effusion. Stable tortuosity and dense calcification of the thoracic aorta. No dissection. The branch vessels are patent. Advanced calcifications at the left subclavian artery origin. Stable dense and extensive three-vessel coronary artery calcifications. Mediastinum/Nodes: No mediastinal or hilar mass or lymphadenopathy. Small scattered lymph nodes are  stable. The esophagus is grossly normal. Lungs/Pleura: Stable emphysematous changes and pulmonary scarring. Stable pulmonary lesions. 6.5 mm left upper lobe pulmonary nodule on image 37 is stable. 30 x 19 mm irregular density in the right upper lobe on image number 60 is stable. 7.5 mm right middle lobe pulmonary nodule on image number 68 is stable. 9 mm right lower lobe pulmonary nodule on image number 89 is stable. No new pulmonary lesions or acute overlying pulmonary process. CT ABDOMEN PELVIS FINDINGS Hepatobiliary: Diffuse confluent hepatic metastatic disease appears grossly stable. Difficult to measure  specific lesions. Conglomerate disease in segment 6 on image number 61 measures 8.6 x 7.2 cm and previously measured approximately 8.0 x 6.7 cm. More anterior lesion in segment 5 on the same image number measures approximately 5.3 x 4.7 cm and previously measured 5.4 x 4.6 cm. Lesion in the inferior most aspect of the right hepatic lobe on image number 71 measures 5.3 x 3.8 cm and previously measured 5.2 x 4.2 cm. The gallbladder is normal.  No common bile duct dilatation. Pancreas: Stable fullness in the pancreatic tail/splenic hilum and calcifications probably chronic inflammatory disease. Spleen: Normal size.  No focal lesions. Adrenals/Urinary Tract: The adrenal glands and kidneys are unremarkable and stable. The bladder is unremarkable. Stomach/Bowel: The stomach, duodenum, small bowel and colon are grossly normal. No acute inflammatory changes, mass lesions or obstructive findings. Vascular/Lymphatic: Severe atherosclerotic calcifications involving the abdominal aorta and branch vessels. Stable infrarenal abdominal aortic aneurysm measuring 4.5 x 4.4 cm on image number 83 ending just above the iliac artery bifurcation. No mesenteric or retroperitoneal mass or adenopathy. Reproductive: The prostate gland and seminal vesicles are unremarkable. Other: No pelvic mass or adenopathy. No free pelvic fluid collections. No inguinal adenopathy. Musculoskeletal: No significant bony findings. No evidence of osseous metastatic disease. IMPRESSION: 1. Stable pulmonary metastatic disease. 2. No mediastinal or hilar mass or adenopathy. 3. Stable emphysematous changes and pulmonary scarring. 4. Overall stable appearing hepatic metastatic disease. 5. No abdominal/pelvic lymphadenopathy. 6. Stable soft tissue fullness in the pancreatic tail with small calcifications likely chronic inflammatory change. 7. Stable 4.5 x 4.4 cm infrarenal abdominal aortic aneurysm. Electronically Signed   By: Marijo Sanes M.D.   On: 06/29/2017  15:54   Ct Abdomen Pelvis W Contrast  Result Date: 06/29/2017 CLINICAL DATA:  Restaging metastatic head and neck cancer EXAM: CT CHEST, ABDOMEN, AND PELVIS WITH CONTRAST TECHNIQUE: Multidetector CT imaging of the chest, abdomen and pelvis was performed following the standard protocol during bolus administration of intravenous contrast. CONTRAST:  167mL ISOVUE-300 IOPAMIDOL (ISOVUE-300) INJECTION 61% COMPARISON:  None. FINDINGS: Cardiovascular: The heart is normal in size. No pericardial effusion. Stable tortuosity and dense calcification of the thoracic aorta. No dissection. The branch vessels are patent. Advanced calcifications at the left subclavian artery origin. Stable dense and extensive three-vessel coronary artery calcifications. Mediastinum/Nodes: No mediastinal or hilar mass or lymphadenopathy. Small scattered lymph nodes are stable. The esophagus is grossly normal. Lungs/Pleura: Stable emphysematous changes and pulmonary scarring. Stable pulmonary lesions. 6.5 mm left upper lobe pulmonary nodule on image 37 is stable. 30 x 19 mm irregular density in the right upper lobe on image number 60 is stable. 7.5 mm right middle lobe pulmonary nodule on image number 68 is stable. 9 mm right lower lobe pulmonary nodule on image number 89 is stable. No new pulmonary lesions or acute overlying pulmonary process. CT ABDOMEN PELVIS FINDINGS Hepatobiliary: Diffuse confluent hepatic metastatic disease appears grossly stable. Difficult to measure specific lesions. Conglomerate  disease in segment 6 on image number 61 measures 8.6 x 7.2 cm and previously measured approximately 8.0 x 6.7 cm. More anterior lesion in segment 5 on the same image number measures approximately 5.3 x 4.7 cm and previously measured 5.4 x 4.6 cm. Lesion in the inferior most aspect of the right hepatic lobe on image number 71 measures 5.3 x 3.8 cm and previously measured 5.2 x 4.2 cm. The gallbladder is normal.  No common bile duct dilatation.  Pancreas: Stable fullness in the pancreatic tail/splenic hilum and calcifications probably chronic inflammatory disease. Spleen: Normal size.  No focal lesions. Adrenals/Urinary Tract: The adrenal glands and kidneys are unremarkable and stable. The bladder is unremarkable. Stomach/Bowel: The stomach, duodenum, small bowel and colon are grossly normal. No acute inflammatory changes, mass lesions or obstructive findings. Vascular/Lymphatic: Severe atherosclerotic calcifications involving the abdominal aorta and branch vessels. Stable infrarenal abdominal aortic aneurysm measuring 4.5 x 4.4 cm on image number 83 ending just above the iliac artery bifurcation. No mesenteric or retroperitoneal mass or adenopathy. Reproductive: The prostate gland and seminal vesicles are unremarkable. Other: No pelvic mass or adenopathy. No free pelvic fluid collections. No inguinal adenopathy. Musculoskeletal: No significant bony findings. No evidence of osseous metastatic disease. IMPRESSION: 1. Stable pulmonary metastatic disease. 2. No mediastinal or hilar mass or adenopathy. 3. Stable emphysematous changes and pulmonary scarring. 4. Overall stable appearing hepatic metastatic disease. 5. No abdominal/pelvic lymphadenopathy. 6. Stable soft tissue fullness in the pancreatic tail with small calcifications likely chronic inflammatory change. 7. Stable 4.5 x 4.4 cm infrarenal abdominal aortic aneurysm. Electronically Signed   By: Marijo Sanes M.D.   On: 06/29/2017 15:54    ASSESSMENT AND PLAN:   This is a very pleasant 77 years old African-American male with metastatic non-small cell lung cancer, squamous cell carcinoma with significant liver metastasis. He is status post systemic chemotherapy with carboplatin and paclitaxel followed by observation followed by disease progression. He is currently undergoing treatment with immunotherapy with Tecentriq [name]) status post 15 cycles. The patient continues to tolerate this  treatment fairly well with no concerning complaints. He had repeat CT scan of the chest, abdomen and pelvis performed recently.  I personally and independently reviewed the scans and discussed the results with the patient today. Has a scan showed no clear evidence for disease progression. I recommended for the patient to proceed with cycle #16 today as a scheduled. For the anemia of chronic disease, we will continue to monitor the patient for now and consider him for transfusion if needed. He will come back for follow-up visit in 3 weeks for evaluation before the next cycle of his treatment. The patient voices understanding of current disease status and treatment options and is in agreement with the current care plan. All questions were answered. The patient knows to call the clinic with any problems, questions or concerns. We can certainly see the patient much sooner if necessary.  Disclaimer: This note was dictated with voice recognition software. Similar sounding words can inadvertently be transcribed and may not be corrected upon review.

## 2017-07-03 NOTE — Patient Instructions (Signed)
Atezolizumab injection What is this medicine? ATEZOLIZUMAB (a te zoe LIZ ue mab) is a monoclonal antibody. It is used to treat bladder cancer (urothelial cancer) and non-small cell lung cancer. This medicine may be used for other purposes; ask your health care provider or pharmacist if you have questions. COMMON BRAND NAME(S): Tecentriq What should I tell my health care provider before I take this medicine? They need to know if you have any of these conditions: -diabetes -immune system problems -infection -inflammatory bowel disease -liver disease -lung or breathing disease -lupus -nervous system problems like myasthenia gravis or Guillain-Barre syndrome -organ transplant -an unusual or allergic reaction to atezolizumab, other medicines, foods, dyes, or preservatives -pregnant or trying to get pregnant -breast-feeding How should I use this medicine? This medicine is for infusion into a vein. It is given by a health care professional in a hospital or clinic setting. A special MedGuide will be given to you before each treatment. Be sure to read this information carefully each time. Talk to your pediatrician regarding the use of this medicine in children. Special care may be needed. Overdosage: If you think you have taken too much of this medicine contact a poison control center or emergency room at once. NOTE: This medicine is only for you. Do not share this medicine with others. What if I miss a dose? It is important not to miss your dose. Call your doctor or health care professional if you are unable to keep an appointment. What may interact with this medicine? Interactions have not been studied. This list may not describe all possible interactions. Give your health care provider a list of all the medicines, herbs, non-prescription drugs, or dietary supplements you use. Also tell them if you smoke, drink alcohol, or use illegal drugs. Some items may interact with your medicine. What  should I watch for while using this medicine? Your condition will be monitored carefully while you are receiving this medicine. You may need blood work done while you are taking this medicine. Do not become pregnant while taking this medicine or for at least 5 months after stopping it. Women should inform their doctor if they wish to become pregnant or think they might be pregnant. There is a potential for serious side effects to an unborn child. Talk to your health care professional or pharmacist for more information. Do not breast-feed an infant while taking this medicine or for at least 5 months after the last dose. What side effects may I notice from receiving this medicine? Side effects that you should report to your doctor or health care professional as soon as possible: -allergic reactions like skin rash, itching or hives, swelling of the face, lips, or tongue -black, tarry stools -bloody or watery diarrhea -breathing problems -changes in vision -chest pain or chest tightness -chills -facial flushing -fever -headache -signs and symptoms of high blood sugar such as dizziness; dry mouth; dry skin; fruity breath; nausea; stomach pain; increased hunger or thirst; increased urination -signs and symptoms of liver injury like dark yellow or brown urine; general ill feeling or flu-like symptoms; light-colored stools; loss of appetite; nausea; right upper belly pain; unusually weak or tired; yellowing of the eyes or skin -stomach pain -trouble passing urine or change in the amount of urine Side effects that usually do not require medical attention (report to your doctor or health care professional if they continue or are bothersome): -cough -diarrhea -joint pain -muscle pain -muscle weakness -tiredness -weight loss This list may not describe all   possible side effects. Call your doctor for medical advice about side effects. You may report side effects to FDA at 1-800-FDA-1088. Where should  I keep my medicine? This drug is given in a hospital or clinic and will not be stored at home. NOTE: This sheet is a summary. It may not cover all possible information. If you have questions about this medicine, talk to your doctor, pharmacist, or health care provider.  2018 Elsevier/Gold Standard (2015-06-30 17:54:14)  

## 2017-07-04 ENCOUNTER — Telehealth: Payer: Self-pay | Admitting: Internal Medicine

## 2017-07-04 NOTE — Telephone Encounter (Signed)
Scheduled appt per 1/22 - patient to get an updated schedule next visit.

## 2017-07-09 ENCOUNTER — Telehealth: Payer: Self-pay | Admitting: Internal Medicine

## 2017-07-09 NOTE — Telephone Encounter (Signed)
Copied from [place] [id number]. Topic: Quick Communication - See Telephone Encounter >> Jul 09, 2017  4:47 PM Vernona Rieger wrote: CRM for notification. See Telephone encounter for:   07/09/17.   Dee Case manager @ [facility] called and wanted to know if Dr Sharlet Salina would put in an order for him to have [place] eval per Dr Julien Nordmann. He needs strengthing & balance. Call back is [phone]   Patient is also requesting a script for a new wheelchair. She said she did not know which pharmacy, but would need to call patient.

## 2017-07-10 NOTE — Telephone Encounter (Signed)
We cannot order without a visit as medicare requires a face to face visit within 3 months of new home health referral. If Dr. Earlie Server has seen him and thinks he needs this he can order without a separate visit. The wheelchair prescription was done back in October. Did he not get one then?

## 2017-07-10 NOTE — Telephone Encounter (Signed)
Notified Dee w/MD response. She stated she will contract pt to inform he need appt w/MD, or if oncologist ok the order they can get from him...Johny Chess

## 2017-07-12 ENCOUNTER — Other Ambulatory Visit: Payer: Self-pay

## 2017-07-12 MED ORDER — MONTELUKAST SODIUM 10 MG PO TABS: 10.0000 mg | ORAL_TABLET | Freq: Every day | ORAL | 1 refills | Status: AC

## 2017-07-12 MED FILL — MONTELUKAST SOD 10 MG TAB: 10 | 90 days supply | Qty: 90 | Fill #0

## 2017-07-16 ENCOUNTER — Other Ambulatory Visit: Payer: Self-pay | Admitting: Internal Medicine

## 2017-07-16 MED FILL — ALBUTEROL 0.083% INHAL SOLN: (2.5 MG/3ML | 30 days supply | Qty: 360 | Fill #0

## 2017-07-24 ENCOUNTER — Encounter: Payer: Self-pay | Admitting: Internal Medicine

## 2017-07-24 ENCOUNTER — Inpatient Hospital Stay: Payer: Medicare Other | Attending: Oncology | Admitting: Internal Medicine

## 2017-07-24 ENCOUNTER — Inpatient Hospital Stay: Payer: Medicare Other

## 2017-07-24 ENCOUNTER — Telehealth: Payer: Self-pay | Admitting: Internal Medicine

## 2017-07-24 VITALS — BP 137/74 | HR 80 | Temp 98.4°F | Resp 16 | Ht 68.0 in | Wt 144.3 lb

## 2017-07-24 DIAGNOSIS — C3411 Malignant neoplasm of upper lobe, right bronchus or lung: Secondary | ICD-10-CM | POA: Diagnosis present

## 2017-07-24 DIAGNOSIS — R110 Nausea: Secondary | ICD-10-CM | POA: Diagnosis not present

## 2017-07-24 DIAGNOSIS — C787 Secondary malignant neoplasm of liver and intrahepatic bile duct: Secondary | ICD-10-CM | POA: Diagnosis not present

## 2017-07-24 DIAGNOSIS — Z79899 Other long term (current) drug therapy: Secondary | ICD-10-CM | POA: Diagnosis not present

## 2017-07-24 DIAGNOSIS — D638 Anemia in other chronic diseases classified elsewhere: Secondary | ICD-10-CM | POA: Diagnosis not present

## 2017-07-24 DIAGNOSIS — Z5112 Encounter for antineoplastic immunotherapy: Secondary | ICD-10-CM

## 2017-07-24 DIAGNOSIS — D649 Anemia, unspecified: Secondary | ICD-10-CM | POA: Insufficient documentation

## 2017-07-24 DIAGNOSIS — D508 Other iron deficiency anemias: Secondary | ICD-10-CM

## 2017-07-24 LAB — CBC WITH DIFFERENTIAL/PLATELET
BASOS ABS: 0.1 10*3/uL (ref 0.0–0.1)
BASOS PCT: 1 %
Eosinophils Absolute: 0.2 10*3/uL (ref 0.0–0.5)
Eosinophils Relative: 2 %
HEMATOCRIT: 33.4 % — AB (ref 38.4–49.9)
Hemoglobin: 10.2 g/dL — ABNORMAL LOW (ref 13.0–17.1)
Lymphocytes Relative: 9 %
Lymphs Abs: 0.6 10*3/uL — ABNORMAL LOW (ref 0.9–3.3)
MCH: 20.9 pg — ABNORMAL LOW (ref 27.2–33.4)
MCHC: 30.4 g/dL — ABNORMAL LOW (ref 32.0–36.0)
MCV: 68.6 fL — ABNORMAL LOW (ref 79.3–98.0)
Monocytes Absolute: 0.5 10*3/uL (ref 0.1–0.9)
Monocytes Relative: 7 %
NEUTROS ABS: 5.9 10*3/uL (ref 1.5–6.5)
NEUTROS PCT: 81 %
Platelets: 220 10*3/uL (ref 140–400)
RBC: 4.87 MIL/uL (ref 4.20–5.82)
RDW: 19.6 % — AB (ref 11.0–14.6)
WBC: 7.3 10*3/uL (ref 4.0–10.3)

## 2017-07-24 LAB — COMPREHENSIVE METABOLIC PANEL
ALBUMIN: 3.4 g/dL — AB (ref 3.5–5.0)
ALT: 14 U/L (ref 0–55)
AST: 38 U/L — AB (ref 5–34)
Alkaline Phosphatase: 261 U/L — ABNORMAL HIGH (ref 40–150)
Anion gap: 9 (ref 3–11)
BUN: 13 mg/dL (ref 7–26)
CHLORIDE: 106 mmol/L (ref 98–109)
CO2: 26 mmol/L (ref 22–29)
Calcium: 9 mg/dL (ref 8.4–10.4)
Creatinine, Ser: 0.81 mg/dL (ref 0.70–1.30)
GFR calc Af Amer: >60 mL/min (ref >60)
GFR calc non Af Amer: >60 mL/min (ref >60)
GLUCOSE: 87 mg/dL (ref 70–140)
POTASSIUM: 3.8 mmol/L (ref 3.5–5.1)
Sodium: 141 mmol/L (ref 136–145)
Total Bilirubin: 0.6 mg/dL (ref 0.2–1.2)
Total Protein: 7.3 g/dL (ref 6.4–8.3)

## 2017-07-24 LAB — TSH: TSH: 3.646 u[IU]/mL (ref 0.320–4.118)

## 2017-07-24 MED ORDER — PROCHLORPERAZINE MALEATE 10 MG PO TABS: 10.0000 mg | ORAL_TABLET | Freq: Four times a day (QID) | ORAL | 0 refills | Status: DC | PRN

## 2017-07-24 MED ORDER — SODIUM CHLORIDE 0.9 % IV SOLN
Freq: Once | INTRAVENOUS | Status: AC
  Administered 2017-07-24: 12:00:00 via INTRAVENOUS

## 2017-07-24 MED ORDER — SODIUM CHLORIDE 0.9 % IV SOLN
1200.0000 mg | Freq: Once | INTRAVENOUS | Status: AC
  Administered 2017-07-24: 1200 mg via INTRAVENOUS
  Filled 2017-07-24: qty 20

## 2017-07-24 MED ORDER — FERROUS SULFATE 325 (65 FE) MG PO TBEC: 325.0000 mg | DELAYED_RELEASE_TABLET | Freq: Three times a day (TID) | ORAL | 3 refills | Status: AC

## 2017-07-24 NOTE — Progress Notes (Signed)
[facility] [phone]   [phone]  OFFICE PROGRESS NOTE  [name], MD [place] [address]  PRINCIPAL DIAGNOSES:  1. 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 2.  metastatic non-small cell lung cancer initially diagnosed as Stage IA non-small-cell lung cancer diagnosed in June 2011.  PRIOR THERAPY:  1. Status post total laryngectomy with. Bilateral selective lymph node dissection as well as left superior parotidectomy with nerve dissection under the care of Dr. Redmond Baseman with positive resection margin. 2. Status post course of concurrent chemoradiation with weekly cisplatin. Last dose of chemotherapy was given January 08, 2007. 3. Status post curative radiotherapy with tomotherapy to the lung lesions completed December 31, 2009 under the care of Dr. Pablo Ledger. 4. Systemic chemotherapy with carboplatin for an AUC of 5 and paclitaxel 175 mg meter squared. Status post 6 cycles.   CURRENT THERAPY: Treatment with immunotherapy with Tecentriq (Atezolizumab) 1200 MG IV every 3 weeks. First dose 08/22/2016. Status post 16 cycles.  INTERVAL HISTORY: [name] 77 y.o. male returns to the clinic today for follow-up visit.  The patient is feeling fine today with no specific complaints except for mild nausea of the last few days.  He lost a few pounds since his last visit.  He denied having any chest pain, shortness of breath, cough or hemoptysis.  He denied having any fever or chills.  He has no nausea, vomiting, diarrhea or constipation.  He continues to tolerate his treatment with immunotherapy fairly well.  MEDICAL HISTORY: Past Medical History:  Diagnosis Date  . Cancer ([place]) 2009   SUPERGLOTTIS INVASIVE SQ CELL CA  . Deficiency anemia 08/15/2016  . Encounter for antineoplastic immunotherapy 08/15/2016  . Full code status 03/17/2015  . Goals of care, counseling/discussion 08/15/2016    . Hypertension   . Stroke [facility]) 2002   left side weakness    ALLERGIES:  has No Known Allergies.  MEDICATIONS:  Current Outpatient Medications  Medication Sig Dispense Refill  . albuterol (PROVENTIL) (2.5 MG/3ML) 0.083% nebulizer solution USE 1 VIAL VIA NEBULIZER EVERY 6 HOURS AS NEEDED FOR WHEEZING OR SHORTNESS OF BREATH 360 mL 5  . allopurinol (ZYLOPRIM) 100 MG tablet Take 1 tablet (100 mg total) by mouth daily. Annual appt due in Feb 2019 must see provider for future refills 90 tablet 0  . amLODipine (NORVASC) 5 MG tablet Take 1 tablet (5 mg total) by mouth at bedtime. Annual appt due in Feb 2019 must see provider for future refills 90 tablet 0  . aspirin 81 MG tablet Take 81 mg by mouth every morning.     . baclofen (LIORESAL) 10 MG tablet Take 1 tablet (10 mg total) at bedtime by mouth. 90 each 2  . cholecalciferol (VITAMIN D) 1000 UNITS tablet Take 1,000 Units by mouth every morning.     . colchicine 0.6 MG tablet Take 1 tablet (0.6 mg total) by mouth 2 (two) times daily. 60 tablet 11  . diphenhydrAMINE (BENADRYL) 25 MG tablet Take 50 mg by mouth every 8 (eight) hours as needed.    . finasteride (PROSCAR) 5 MG tablet Take 1 tablet (5 mg total) by mouth every morning. Annual appt due in Feb 2019 must see provider for future refills 90 tablet 0  . loratadine (CLARITIN) 10 MG tablet Take 10 mg by mouth daily.    . metoprolol succinate (TOPROL-XL) 25 MG 24 hr tablet Take 1  tablet (25 mg total) every morning by mouth. 90 tablet 2  . montelukast (SINGULAIR) 10 MG tablet Take 1 tablet (10 mg total) by mouth at bedtime. 90 tablet 1  . simvastatin (ZOCOR) 20 MG tablet Take 1 tablet (20 mg total) by mouth every evening. Annual appt due in Feb 2019 must see provider for future refills 90 tablet 0  . tadalafil (CIALIS) 5 MG tablet Take 1 tablet (5 mg total) by mouth daily. Annual appt due in Feb 2019 must see provider for future refills 90 tablet 0  . tiZANidine (ZANAFLEX) 4 MG tablet Take 1  tablet (4 mg total) by mouth 3 (three) times daily as needed for muscle spasms. 90 tablet 1   No current facility-administered medications for this visit.     SURGICAL HISTORY:  Past Surgical History:  Procedure Laterality Date  . TRACHEOSTOMY  2009    REVIEW OF SYSTEMS:  A comprehensive review of systems was negative except for: Gastrointestinal: positive for nausea   PHYSICAL EXAMINATION: General appearance: alert, cooperative and no distress Head: Normocephalic, without obvious abnormality, atraumatic Neck: no adenopathy, no JVD, supple, symmetrical, trachea midline and thyroid not enlarged, symmetric, no tenderness/mass/nodules Lymph nodes: Cervical, supraclavicular, and axillary nodes normal. Resp: clear to auscultation bilaterally Back: symmetric, no curvature. ROM normal. No CVA tenderness. Cardio: regular rate and rhythm, S1, S2 normal, no murmur, click, rub or gallop GI: soft, non-tender; bowel sounds normal; no masses,  no organomegaly Extremities: extremities normal, atraumatic, no cyanosis or edema  ECOG PERFORMANCE STATUS: 1 - Symptomatic but completely ambulatory  Blood pressure 137/74, pulse 80, temperature 98.4 F (36.9 C), temperature source Oral, resp. rate 16, height 5\' 8"  (1.727 m), weight 144 lb 4.8 oz (65.5 kg), SpO2 99 %.  LABORATORY DATA: Lab Results  Component Value Date   WBC 7.3 07/24/2017   HGB 10.2 (L) 07/24/2017   HCT 33.4 (L) 07/24/2017   MCV 68.6 (L) 07/24/2017   PLT 220 07/24/2017      Chemistry      Component Value Date/Time   NA 140 07/03/2017 1019   NA 138 06/13/2017 1343   K 4.0 07/03/2017 1019   K 3.8 06/13/2017 1343   CL 106 07/03/2017 1019   CL 103 08/02/2012 1107   CO2 25 07/03/2017 1019   CO2 25 06/13/2017 1343   BUN 15 07/03/2017 1019   BUN 14.9 06/13/2017 1343   CREATININE 0.83 07/03/2017 1019   CREATININE 0.8 06/13/2017 1343      Component Value Date/Time   CALCIUM 8.9 07/03/2017 1019   CALCIUM 9.0 06/13/2017 1343    ALKPHOS 262 (H) 07/03/2017 1019   ALKPHOS 237 (H) 06/13/2017 1343   AST 51 (H) 07/03/2017 1019   AST 38 (H) 06/13/2017 1343   ALT 24 07/03/2017 1019   ALT 20 06/13/2017 1343   BILITOT 0.6 07/03/2017 1019   BILITOT 0.45 06/13/2017 1343       RADIOGRAPHIC STUDIES: Ct Chest W Contrast  Result Date: 06/29/2017 CLINICAL DATA:  Restaging metastatic head and neck cancer EXAM: CT CHEST, ABDOMEN, AND PELVIS WITH CONTRAST TECHNIQUE: Multidetector CT imaging of the chest, abdomen and pelvis was performed following the standard protocol during bolus administration of intravenous contrast. CONTRAST:  165mL ISOVUE-300 IOPAMIDOL (ISOVUE-300) INJECTION 61% COMPARISON:  None. FINDINGS: Cardiovascular: The heart is normal in size. No pericardial effusion. Stable tortuosity and dense calcification of the thoracic aorta. No dissection. The branch vessels are patent. Advanced calcifications at the left subclavian artery origin. Stable dense and  extensive three-vessel coronary artery calcifications. Mediastinum/Nodes: No mediastinal or hilar mass or lymphadenopathy. Small scattered lymph nodes are stable. The esophagus is grossly normal. Lungs/Pleura: Stable emphysematous changes and pulmonary scarring. Stable pulmonary lesions. 6.5 mm left upper lobe pulmonary nodule on image 37 is stable. 30 x 19 mm irregular density in the right upper lobe on image number 60 is stable. 7.5 mm right middle lobe pulmonary nodule on image number 68 is stable. 9 mm right lower lobe pulmonary nodule on image number 89 is stable. No new pulmonary lesions or acute overlying pulmonary process. CT ABDOMEN PELVIS FINDINGS Hepatobiliary: Diffuse confluent hepatic metastatic disease appears grossly stable. Difficult to measure specific lesions. Conglomerate disease in segment 6 on image number 61 measures 8.6 x 7.2 cm and previously measured approximately 8.0 x 6.7 cm. More anterior lesion in segment 5 on the same image number measures  approximately 5.3 x 4.7 cm and previously measured 5.4 x 4.6 cm. Lesion in the inferior most aspect of the right hepatic lobe on image number 71 measures 5.3 x 3.8 cm and previously measured 5.2 x 4.2 cm. The gallbladder is normal.  No common bile duct dilatation. Pancreas: Stable fullness in the pancreatic tail/splenic hilum and calcifications probably chronic inflammatory disease. Spleen: Normal size.  No focal lesions. Adrenals/Urinary Tract: The adrenal glands and kidneys are unremarkable and stable. The bladder is unremarkable. Stomach/Bowel: The stomach, duodenum, small bowel and colon are grossly normal. No acute inflammatory changes, mass lesions or obstructive findings. Vascular/Lymphatic: Severe atherosclerotic calcifications involving the abdominal aorta and branch vessels. Stable infrarenal abdominal aortic aneurysm measuring 4.5 x 4.4 cm on image number 83 ending just above the iliac artery bifurcation. No mesenteric or retroperitoneal mass or adenopathy. Reproductive: The prostate gland and seminal vesicles are unremarkable. Other: No pelvic mass or adenopathy. No free pelvic fluid collections. No inguinal adenopathy. Musculoskeletal: No significant bony findings. No evidence of osseous metastatic disease. IMPRESSION: 1. Stable pulmonary metastatic disease. 2. No mediastinal or hilar mass or adenopathy. 3. Stable emphysematous changes and pulmonary scarring. 4. Overall stable appearing hepatic metastatic disease. 5. No abdominal/pelvic lymphadenopathy. 6. Stable soft tissue fullness in the pancreatic tail with small calcifications likely chronic inflammatory change. 7. Stable 4.5 x 4.4 cm infrarenal abdominal aortic aneurysm. Electronically Signed   By: Marijo Sanes M.D.   On: 06/29/2017 15:54   Ct Abdomen Pelvis W Contrast  Result Date: 06/29/2017 CLINICAL DATA:  Restaging metastatic head and neck cancer EXAM: CT CHEST, ABDOMEN, AND PELVIS WITH CONTRAST TECHNIQUE: Multidetector CT imaging of the  chest, abdomen and pelvis was performed following the standard protocol during bolus administration of intravenous contrast. CONTRAST:  156mL ISOVUE-300 IOPAMIDOL (ISOVUE-300) INJECTION 61% COMPARISON:  None. FINDINGS: Cardiovascular: The heart is normal in size. No pericardial effusion. Stable tortuosity and dense calcification of the thoracic aorta. No dissection. The branch vessels are patent. Advanced calcifications at the left subclavian artery origin. Stable dense and extensive three-vessel coronary artery calcifications. Mediastinum/Nodes: No mediastinal or hilar mass or lymphadenopathy. Small scattered lymph nodes are stable. The esophagus is grossly normal. Lungs/Pleura: Stable emphysematous changes and pulmonary scarring. Stable pulmonary lesions. 6.5 mm left upper lobe pulmonary nodule on image 37 is stable. 30 x 19 mm irregular density in the right upper lobe on image number 60 is stable. 7.5 mm right middle lobe pulmonary nodule on image number 68 is stable. 9 mm right lower lobe pulmonary nodule on image number 89 is stable. No new pulmonary lesions or acute overlying pulmonary process. CT  ABDOMEN PELVIS FINDINGS Hepatobiliary: Diffuse confluent hepatic metastatic disease appears grossly stable. Difficult to measure specific lesions. Conglomerate disease in segment 6 on image number 61 measures 8.6 x 7.2 cm and previously measured approximately 8.0 x 6.7 cm. More anterior lesion in segment 5 on the same image number measures approximately 5.3 x 4.7 cm and previously measured 5.4 x 4.6 cm. Lesion in the inferior most aspect of the right hepatic lobe on image number 71 measures 5.3 x 3.8 cm and previously measured 5.2 x 4.2 cm. The gallbladder is normal.  No common bile duct dilatation. Pancreas: Stable fullness in the pancreatic tail/splenic hilum and calcifications probably chronic inflammatory disease. Spleen: Normal size.  No focal lesions. Adrenals/Urinary Tract: The adrenal glands and kidneys are  unremarkable and stable. The bladder is unremarkable. Stomach/Bowel: The stomach, duodenum, small bowel and colon are grossly normal. No acute inflammatory changes, mass lesions or obstructive findings. Vascular/Lymphatic: Severe atherosclerotic calcifications involving the abdominal aorta and branch vessels. Stable infrarenal abdominal aortic aneurysm measuring 4.5 x 4.4 cm on image number 83 ending just above the iliac artery bifurcation. No mesenteric or retroperitoneal mass or adenopathy. Reproductive: The prostate gland and seminal vesicles are unremarkable. Other: No pelvic mass or adenopathy. No free pelvic fluid collections. No inguinal adenopathy. Musculoskeletal: No significant bony findings. No evidence of osseous metastatic disease. IMPRESSION: 1. Stable pulmonary metastatic disease. 2. No mediastinal or hilar mass or adenopathy. 3. Stable emphysematous changes and pulmonary scarring. 4. Overall stable appearing hepatic metastatic disease. 5. No abdominal/pelvic lymphadenopathy. 6. Stable soft tissue fullness in the pancreatic tail with small calcifications likely chronic inflammatory change. 7. Stable 4.5 x 4.4 cm infrarenal abdominal aortic aneurysm. Electronically Signed   By: Marijo Sanes M.D.   On: 06/29/2017 15:54    ASSESSMENT AND PLAN:   This is a very pleasant 77 years old African-American male with metastatic non-small cell lung cancer, squamous cell carcinoma with significant liver metastasis. He is status post systemic chemotherapy with carboplatin and paclitaxel followed by observation followed by disease progression. He is currently undergoing treatment with immunotherapy with Tecentriq [name]) status post 16 cycles. The patient tolerated the last cycle of his treatment fairly well. I recommended for him to proceed with cycle #17 today as a schedule. For the anemia of chronic disease, we will continue to monitor the patient for now and consider him for transfusion if  needed.  He was also advised to take oral iron tablets. I will see him back for follow-up visit in 3 weeks for evaluation before starting cycle #18. The patient was advised to call immediately if he has any concerning symptoms in the interval. The patient voices understanding of current disease status and treatment options and is in agreement with the current care plan. All questions were answered. The patient knows to call the clinic with any problems, questions or concerns. We can certainly see the patient much sooner if necessary.  Disclaimer: This note was dictated with voice recognition software. Similar sounding words can inadvertently be transcribed and may not be corrected upon review.

## 2017-07-24 NOTE — Patient Instructions (Signed)
[facility] Discharge Instructions for Patients Receiving Chemotherapy  Today you received the following chemotherapy agents: Tecentriq  To help prevent nausea and vomiting after your treatment, we encourage you to take your nausea medication as directed.   If you develop nausea and vomiting that is not controlled by your nausea medication, call the clinic.   BELOW ARE SYMPTOMS THAT SHOULD BE REPORTED IMMEDIATELY:  *FEVER GREATER THAN 100.5 F  *CHILLS WITH OR WITHOUT FEVER  NAUSEA AND VOMITING THAT IS NOT CONTROLLED WITH YOUR NAUSEA MEDICATION  *UNUSUAL SHORTNESS OF BREATH  *UNUSUAL BRUISING OR BLEEDING  TENDERNESS IN MOUTH AND THROAT WITH OR WITHOUT PRESENCE OF ULCERS  *URINARY PROBLEMS  *BOWEL PROBLEMS  UNUSUAL RASH Items with * indicate a potential emergency and should be followed up as soon as possible.  Feel free to call the clinic should you have any questions or concerns. The clinic phone number is [phone].  Please show the CHEMO ALERT CARD at check-in to the Emergency Department and triage nurse.   

## 2017-07-24 NOTE — Telephone Encounter (Signed)
Appts  Already scheduled per 2/12 los - Gave patient AVS and calender per los.

## 2017-08-02 ENCOUNTER — Ambulatory Visit: Payer: Medicare Other | Admitting: Internal Medicine

## 2017-08-02 ENCOUNTER — Encounter: Payer: Self-pay | Admitting: Internal Medicine

## 2017-08-02 VITALS — BP 122/82 | HR 82 | Temp 97.9°F | Ht 68.0 in | Wt 145.0 lb

## 2017-08-02 DIAGNOSIS — C329 Malignant neoplasm of larynx, unspecified: Secondary | ICD-10-CM | POA: Diagnosis not present

## 2017-08-02 DIAGNOSIS — Z0000 Encounter for general adult medical examination without abnormal findings: Secondary | ICD-10-CM | POA: Diagnosis not present

## 2017-08-02 DIAGNOSIS — I10 Essential (primary) hypertension: Secondary | ICD-10-CM | POA: Diagnosis not present

## 2017-08-02 DIAGNOSIS — K769 Liver disease, unspecified: Secondary | ICD-10-CM

## 2017-08-02 MED ORDER — PROCHLORPERAZINE MALEATE 10 MG PO TABS: 10.0000 mg | ORAL_TABLET | Freq: Four times a day (QID) | ORAL | 0 refills | Status: AC | PRN

## 2017-08-02 MED ORDER — TADALAFIL 5 MG PO TABS: 5.0000 mg | ORAL_TABLET | Freq: Every day | ORAL | 3 refills | Status: AC

## 2017-08-02 MED ORDER — TAMSULOSIN HCL 0.4 MG PO CAPS: 0.4000 mg | ORAL_CAPSULE | Freq: Two times a day (BID) | ORAL | 3 refills | Status: AC

## 2017-08-02 MED FILL — PROCHLORPERAZINE 10 MG TAB: 10 | 11 days supply | Qty: 45 | Fill #0

## 2017-08-02 NOTE — Progress Notes (Signed)
   Subjective:    Patient ID: [name], male    DOB: [birth year], 77 y.o.   MRN: [id number]  HPI The patient is a 77 YO man coming in for physical. No new concerns.  PMH, [facility], social history reviewed and updated.   Review of Systems  Constitutional: Negative for activity change, appetite change, fatigue, fever and unexpected weight change.  HENT: Positive for congestion and postnasal drip.   Eyes: Negative.   Respiratory: Positive for cough. Negative for chest tightness and shortness of breath.   Cardiovascular: Negative for chest pain, palpitations and leg swelling.  Gastrointestinal: Negative for abdominal distention, abdominal pain, constipation, diarrhea, nausea and vomiting.  Musculoskeletal: Positive for arthralgias and gait problem. Negative for joint swelling and neck pain.  Skin: Negative.   Neurological: Positive for weakness and numbness.  Psychiatric/Behavioral: Negative.       Objective:   Physical Exam  Constitutional: He is oriented to person, place, and time. He appears well-developed and well-nourished.  HENT:  Head: Normocephalic and atraumatic.  Eyes: EOM are normal.  Neck: Normal range of motion.  Cardiovascular: Normal rate and regular rhythm.  Pulmonary/Chest: Effort normal and breath sounds normal. No respiratory distress. He has no wheezes. He has no rales.  Trach in place and talks with valve  Abdominal: Soft. Bowel sounds are normal. He exhibits no distension. There is no tenderness. There is no rebound.  Musculoskeletal: He exhibits no edema.  Neurological: He is alert and oriented to person, place, and time. A cranial nerve deficit is present. Coordination abnormal.  Left side paralyzed and left arm contracted  Skin: Skin is warm and dry.  Psychiatric: He has a normal mood and affect.   Vitals:   08/02/17 1508  BP: 122/82  Pulse: 82  Temp: 97.9 F (36.6 C)  TempSrc: Oral  SpO2: 98%  Weight: 145 lb (65.8 kg)  Height: 5\' 8"  (1.727 m)     Assessment & Plan:

## 2017-08-02 NOTE — Patient Instructions (Signed)
We have sent in the refills. We have sent in the compazine to [facility] and the bladder medicines to the mail order.  We have sent in the cialis (tadalifil) which is 1 pill daily for the bladder.   The new one we have sent in is flomax which you can take 1 pill twice a day to help the bladder.

## 2017-08-03 NOTE — Assessment & Plan Note (Signed)
BP at goal on his amlodipine and metoprolol and refilled today. Recent CMP without indication for change.

## 2017-08-03 NOTE — Assessment & Plan Note (Signed)
Flu and pneumonia up to date. Declines tetanus today. Counseled about shingrix vaccine. Colonoscopy up to date. Counseled about home safety and fall prevention. Given screening recommendations.

## 2017-08-09 ENCOUNTER — Telehealth: Payer: Self-pay

## 2017-08-09 ENCOUNTER — Other Ambulatory Visit: Payer: Self-pay

## 2017-08-09 DIAGNOSIS — E785 Hyperlipidemia, unspecified: Secondary | ICD-10-CM

## 2017-08-09 DIAGNOSIS — C329 Malignant neoplasm of larynx, unspecified: Secondary | ICD-10-CM

## 2017-08-09 DIAGNOSIS — K769 Liver disease, unspecified: Secondary | ICD-10-CM

## 2017-08-09 MED ORDER — SIMVASTATIN 20 MG PO TABS
20.0000 mg | ORAL_TABLET | Freq: Every evening | ORAL | 3 refills | Status: AC
Start: 2017-08-09 — End: ?

## 2017-08-09 NOTE — Telephone Encounter (Signed)
KEY for script is [id number]  Submitted on cover my meds and currently pending approval.

## 2017-08-12 ENCOUNTER — Telehealth: Payer: Self-pay | Admitting: Family Medicine

## 2017-08-13 ENCOUNTER — Telehealth: Payer: Self-pay | Admitting: Medical Oncology

## 2017-08-13 NOTE — Telephone Encounter (Signed)
Wife called to report Adrik died 2017-08-18

## 2017-08-13 NOTE — Telephone Encounter (Signed)
So sorry to hear that.  Please cancel all his appointments.

## 2017-08-14 ENCOUNTER — Telehealth: Payer: Self-pay | Admitting: *Deleted

## 2017-08-14 ENCOUNTER — Other Ambulatory Visit: Payer: Medicare Other

## 2017-08-14 ENCOUNTER — Ambulatory Visit: Payer: Medicare Other | Admitting: Internal Medicine

## 2017-08-14 ENCOUNTER — Ambulatory Visit: Payer: Medicare Other

## 2017-08-14 NOTE — Telephone Encounter (Signed)
Called pt wife to express condolences from Dr. Julien Nordmann and staff.. Wife thanked [place] for the call.

## 2017-08-15 ENCOUNTER — Telehealth: Payer: Self-pay | Admitting: Internal Medicine

## 2017-08-15 ENCOUNTER — Telehealth: Payer: Self-pay

## 2017-08-15 NOTE — Telephone Encounter (Signed)
Copied from [place]. Topic: Quick Communication - See Telephone Encounter >> Aug 15, 2017  2:42 PM Synthia Innocent wrote: CRM for notification. See Telephone encounter for:  Requesting to have death certificate signed by Dr Sharlet Salina 08/15/17.

## 2017-08-15 NOTE — Telephone Encounter (Signed)
On 08/15/17 I received a d/c from [employer] (original). The d/c is for cremation. The patient is a patient of [facility]. The d/c will be taken to [facility] for signature.  On 08/17/17 I received the d/c back from [employer]. I got the d/c ready and called the funeral home to let them know the d/c is ready for pickup. I also faxed a copy to the funeral home per the funeral home requet.

## 2017-08-16 NOTE — Telephone Encounter (Signed)
Placed on MD desk to sign

## 2017-08-23 ENCOUNTER — Other Ambulatory Visit: Payer: Self-pay | Admitting: Nurse Practitioner

## 2017-09-04 ENCOUNTER — Ambulatory Visit: Payer: Medicare Other

## 2017-09-04 ENCOUNTER — Ambulatory Visit: Payer: Medicare Other | Admitting: Internal Medicine

## 2017-09-04 ENCOUNTER — Other Ambulatory Visit: Payer: Medicare Other

## 2017-09-10 NOTE — Telephone Encounter (Signed)
Received a call from [employer]. Mr. [name] was found unresponsive in his chair, at home,  by his wife early afternoon 3/3/20019. He was last seen alive at 27 am in his home by his wife. EMS was dispatched to the home, pt was in asystole, they attempted resuscitation efforts for 40 min without success. EMS reports scene was not suspicious.

## 2017-09-10 DEATH — deceased

## 2017-09-25 ENCOUNTER — Ambulatory Visit: Payer: Medicare Other | Admitting: Internal Medicine

## 2017-09-25 ENCOUNTER — Ambulatory Visit: Payer: Medicare Other

## 2017-09-25 ENCOUNTER — Other Ambulatory Visit: Payer: Medicare Other

## 2018-06-10 IMAGING — CT CT CHEST W/ CM
3 of 5 series · 16 of 46 positions shown, 18 images · IV contrast (iopamidol)
Comparison: CT 10/26/2015

CLINICAL DATA: Restaging supraglottic squamous cell carcinoma
diagnosed 8883. Metastatic disease the lungs and liver. Radiation
therapy complete. Chemotherapy complete.

EXAM:
CT CHEST, ABDOMEN, AND PELVIS WITH CONTRAST
TECHNIQUE: Multidetector CT imaging of the chest, abdomen and pelvis was
performed following the standard protocol during bolus
administration of intravenous contrast.
CONTRAST:  100mL [id number] IOPAMIDOL ([id number]) INJECTION 61%

[Series 2: cap with st · axial · 0.86mm/px · z∈[-268,+257]mm · 10 of 129 slices shown, 12 images]
[im 12/129  soft-tissue]
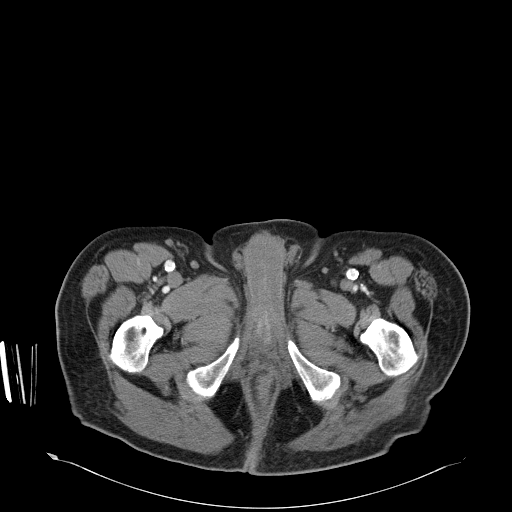
[im 12/129  bone]
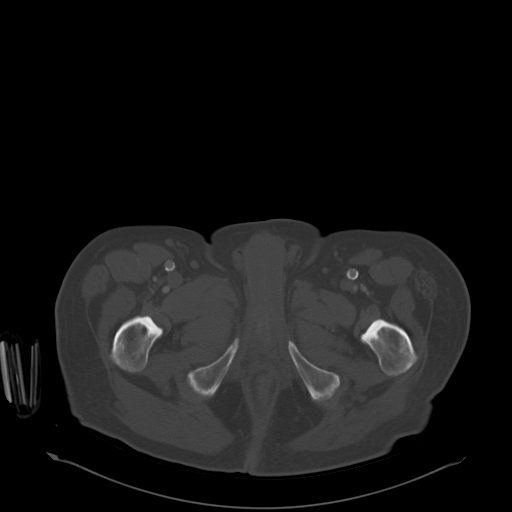
[im 24/129  soft-tissue]
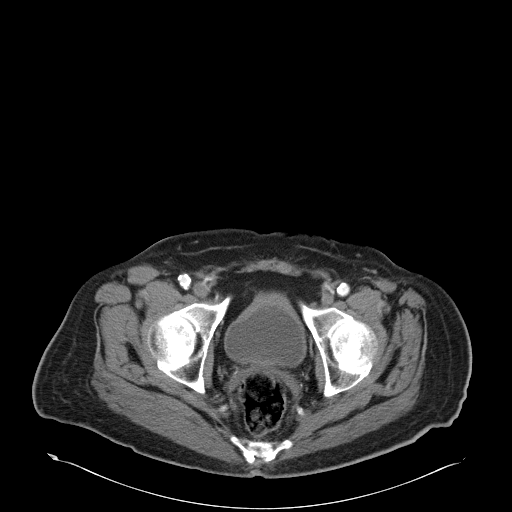
[im 35/129  soft-tissue]
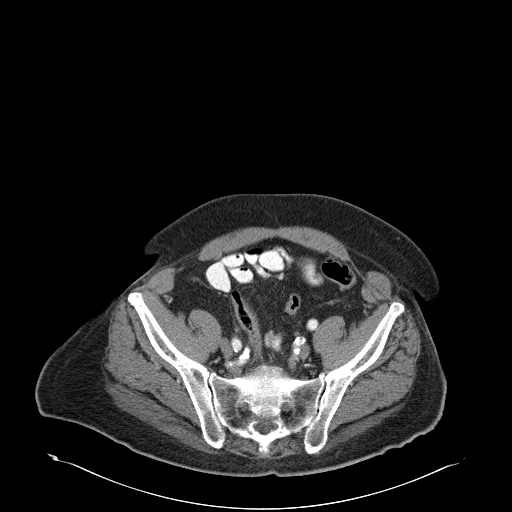
[im 47/129  soft-tissue]
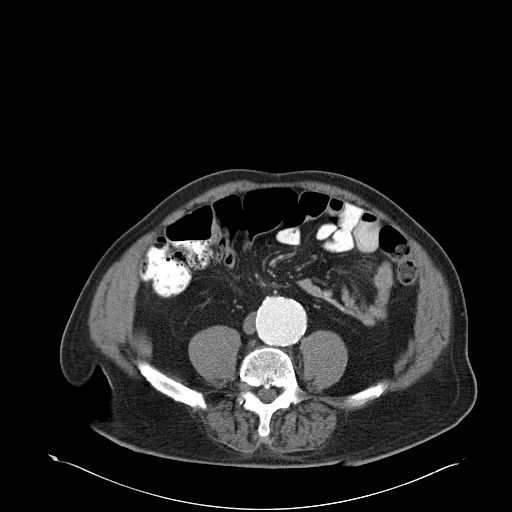
[im 59/129  soft-tissue]
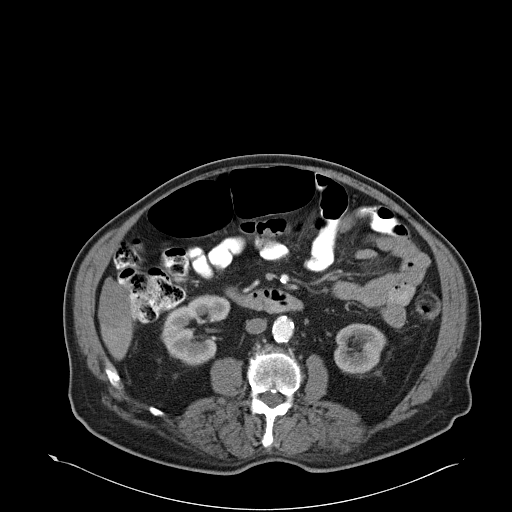
[im 70/129  soft-tissue]
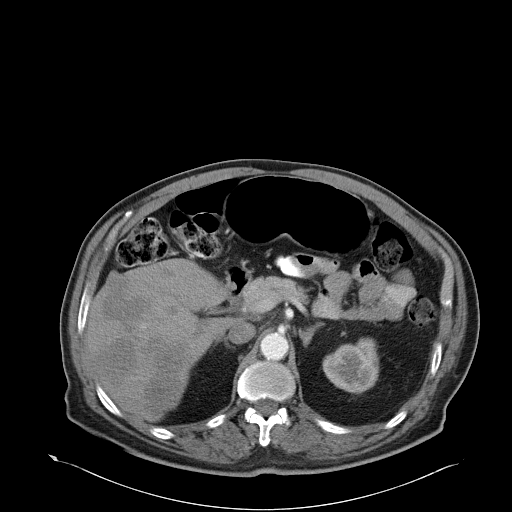
[im 82/129  soft-tissue]
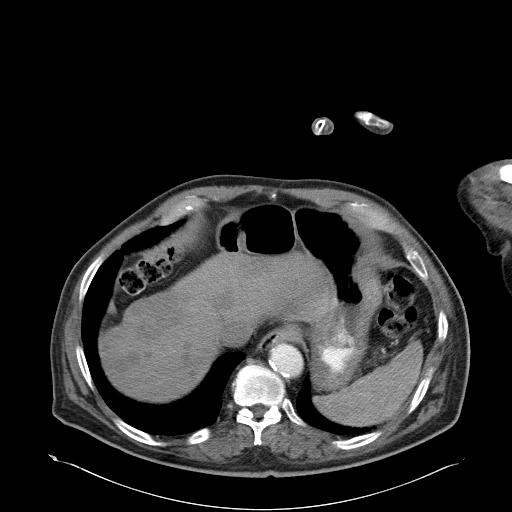
[im 94/129  soft-tissue]
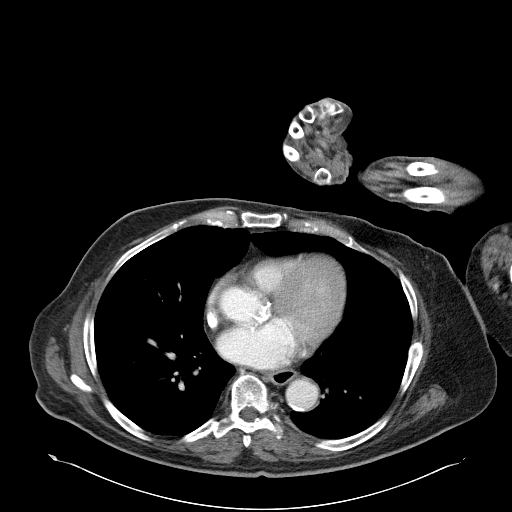
[im 105/129  soft-tissue]
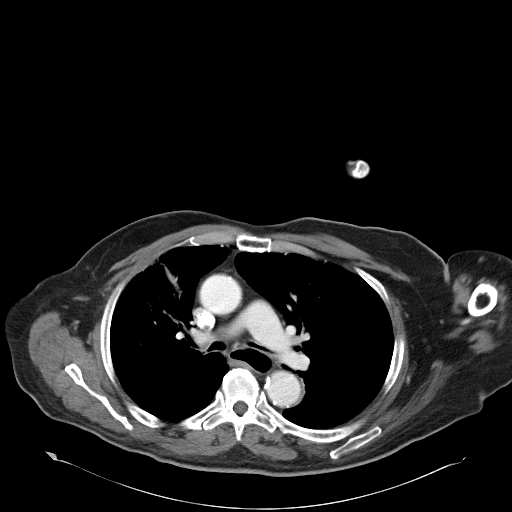
[im 105/129  bone]
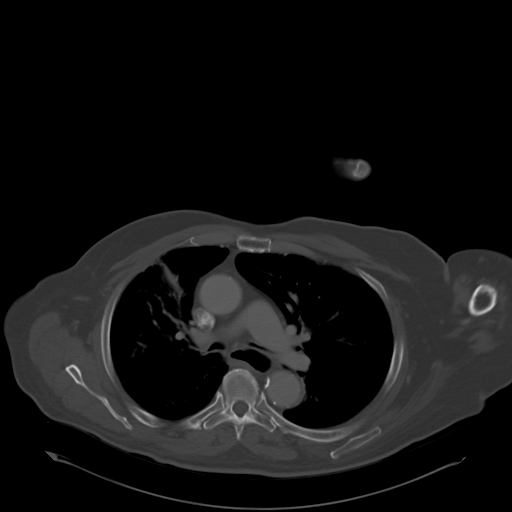
[im 117/129  soft-tissue]
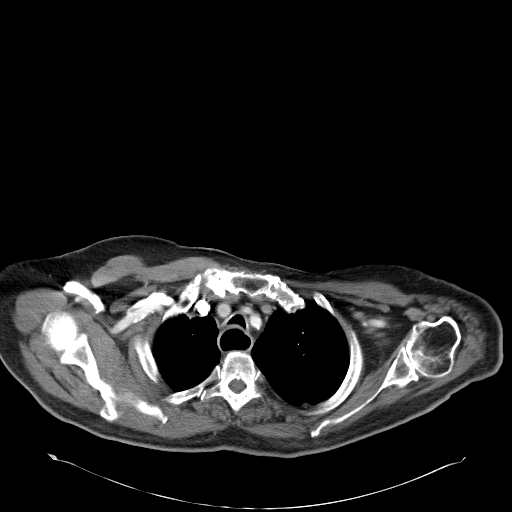

[Series 4: lung windows · axial · 0.86mm/px · z∈[+25,+109]mm · 3 of 157 slices shown]
[im 11/157  bone]
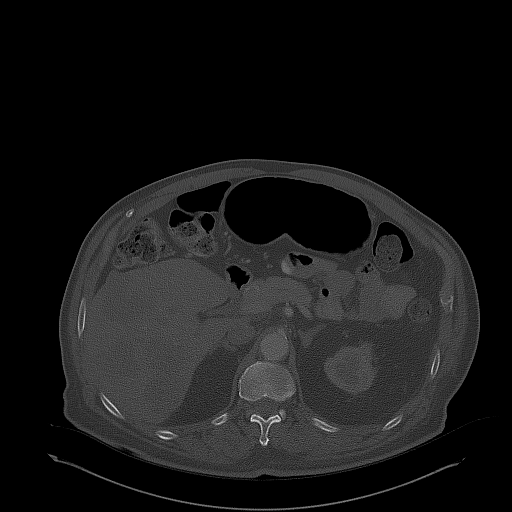
[im 32/157  bone]
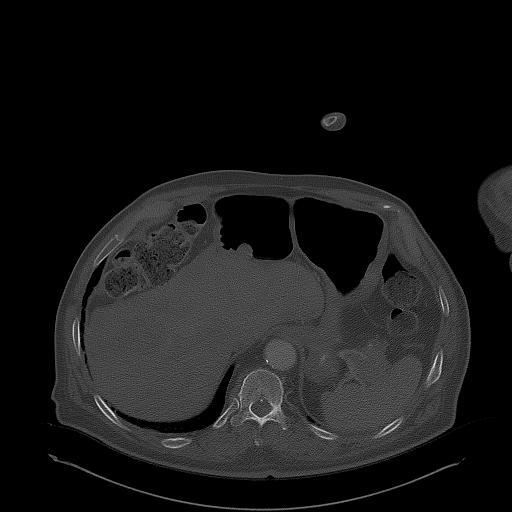
[im 53/157  bone]
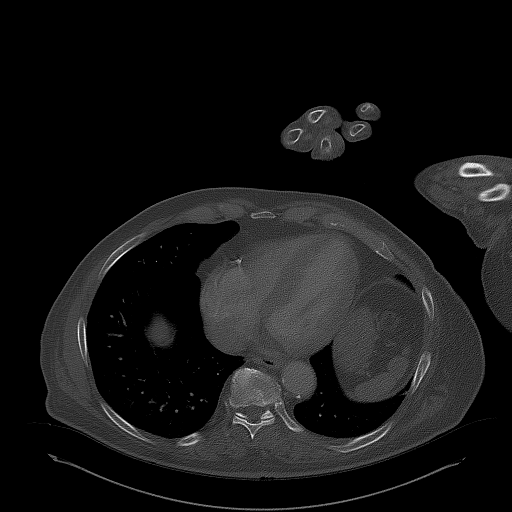

[Series 602: <mpr thick range> · coronal · 1.25mm/px · 3 of 162 slices shown]
[im 54/162  soft-tissue]
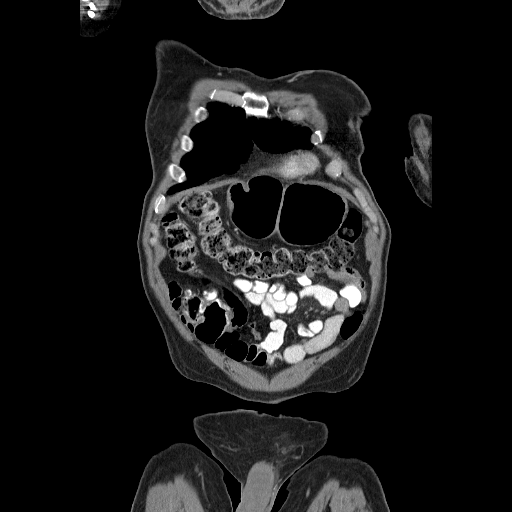
[im 72/162  soft-tissue]
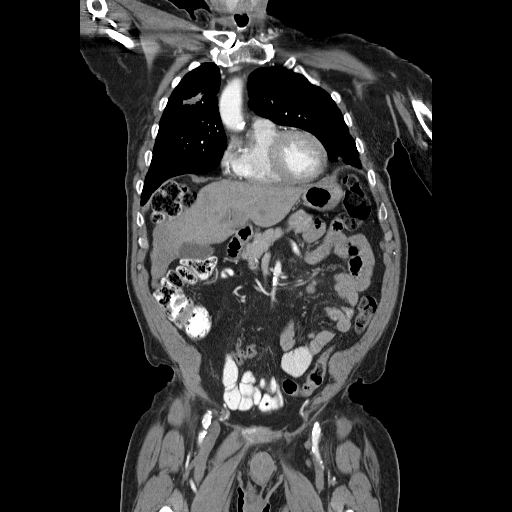
[im 90/162  soft-tissue]
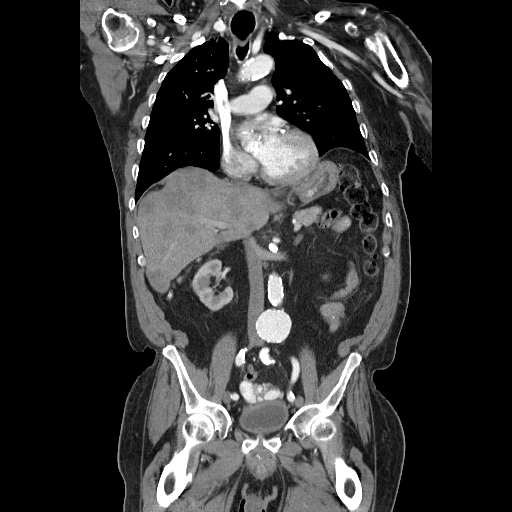

[16 of 46 positions shown; findings below may reference images not displayed]

FINDINGS: CT CHEST FINDINGS

Cardiovascular: Coronary artery calcification and aortic
atherosclerotic calcification. No pericardial fluid

Mediastinum/Nodes: No axillary or supraclavicular lymphadenopathy.
No mediastinal hilar lymphadenopathy. No pericardial fluid

Lungs/Pleura: 6 mm nodule at the LEFT lung apex (image 37, series 4)
not changed from 7 mm. Consolidation in the RIGHT upper lobe
measuring 2.6 x 1.9 cm compares to 2.7 x 2.0 cm for no interval
change. Small nodule inferior in the RIGHT upper lobe measuring 4 mm
on image 73, series 4 also unchanged.

Musculoskeletal: No aggressive osseous lesion.

CT ABDOMEN PELVIS FINDINGS

Hepatobiliary: Multiple hepatic metastasis again demonstrated. No
new lesions are evident. Index lesion in the medial posterior RIGHT
hepatic lobe measures 52 x 36 mm compared to 60 x 42 mm. Adjacent
more lateral RIGHT hepatic lobe lesion measures 42 x 34 mm compared
to a 48 x 41 mm.

Pancreas: Lesion in tail of pancreas measures 29 x 33 mm compared to
30 x 37 mm.

Spleen: Normal spleen

Adrenals/Urinary Tract: No change in adrenal glands or kidneys.
Bladder normal.

Stomach/Bowel: Stomach, small bowel, appendix and cecum normal. The
colon and rectosigmoid colon normal.

Vascular/Lymphatic: Heavy calcification abdominal aorta. Saccular
aneurysm at the aortic bifurcation measures 42 mm unchanged from 43
mm.

No periportal retroperitoneal adenopathy.  No iliac adenopathy

Reproductive: Prostate normal

Other: No peritoneal disease

Musculoskeletal: No aggressive osseous lesion.
IMPRESSION: Chest Impression:

1. Stable bilateral pulmonary nodular metastasis.

Abdomen / Pelvis Impression:

1. Stable to decreased size of hepatic metastasis.
2. Stable pancreatic tail lesion.
3. No disease progression in the abdomen or pelvis.
